# Patient Record
Sex: Female | Born: 1980 | Race: White | Hispanic: No | Smoking: Current every day smoker
Health system: Southern US, Community
[De-identification: ages and names within clinical notes are randomized; demographics above are authoritative.]

## PROBLEM LIST (undated history)

## (undated) ENCOUNTER — Emergency Department: Admission: EM | Payer: Medicaid Other | Source: Home / Self Care

## (undated) DIAGNOSIS — F32A Depression, unspecified: Secondary | ICD-10-CM

## (undated) DIAGNOSIS — N83209 Unspecified ovarian cyst, unspecified side: Secondary | ICD-10-CM

## (undated) DIAGNOSIS — B009 Herpesviral infection, unspecified: Secondary | ICD-10-CM

## (undated) DIAGNOSIS — F319 Bipolar disorder, unspecified: Secondary | ICD-10-CM

## (undated) DIAGNOSIS — K219 Gastro-esophageal reflux disease without esophagitis: Secondary | ICD-10-CM

## (undated) HISTORY — PX: APPENDECTOMY: SHX54

## (undated) HISTORY — PX: TUBAL LIGATION: SHX77

## (undated) HISTORY — PX: CHOLECYSTECTOMY: SHX55

---

## 2004-09-08 ENCOUNTER — Observation Stay: Payer: Self-pay

## 2004-09-22 ENCOUNTER — Inpatient Hospital Stay: Payer: Self-pay | Admitting: Obstetrics and Gynecology

## 2005-01-03 ENCOUNTER — Emergency Department: Payer: Self-pay | Admitting: Emergency Medicine

## 2005-06-15 ENCOUNTER — Emergency Department: Payer: Self-pay | Admitting: Emergency Medicine

## 2005-07-30 ENCOUNTER — Emergency Department: Payer: Self-pay | Admitting: Unknown Physician Specialty

## 2005-10-20 ENCOUNTER — Emergency Department: Payer: Self-pay | Admitting: Emergency Medicine

## 2005-11-07 ENCOUNTER — Emergency Department: Payer: Self-pay | Admitting: Internal Medicine

## 2006-08-31 ENCOUNTER — Emergency Department: Payer: Self-pay | Admitting: General Practice

## 2006-09-21 ENCOUNTER — Observation Stay: Payer: Self-pay

## 2006-10-18 ENCOUNTER — Inpatient Hospital Stay: Payer: Self-pay | Admitting: Obstetrics and Gynecology

## 2006-12-14 ENCOUNTER — Emergency Department: Payer: Self-pay | Admitting: Emergency Medicine

## 2007-01-02 ENCOUNTER — Emergency Department: Payer: Self-pay | Admitting: Emergency Medicine

## 2007-01-26 ENCOUNTER — Emergency Department: Payer: Self-pay | Admitting: Internal Medicine

## 2007-01-26 ENCOUNTER — Other Ambulatory Visit: Payer: Self-pay

## 2007-03-08 ENCOUNTER — Emergency Department: Payer: Self-pay | Admitting: Emergency Medicine

## 2007-03-10 ENCOUNTER — Emergency Department: Payer: Self-pay | Admitting: Emergency Medicine

## 2007-03-20 ENCOUNTER — Emergency Department: Payer: Self-pay

## 2007-04-17 ENCOUNTER — Emergency Department: Payer: Self-pay | Admitting: Unknown Physician Specialty

## 2007-05-16 ENCOUNTER — Emergency Department: Payer: Self-pay | Admitting: Emergency Medicine

## 2007-08-01 ENCOUNTER — Emergency Department: Payer: Self-pay | Admitting: Emergency Medicine

## 2008-03-02 ENCOUNTER — Emergency Department: Payer: Self-pay | Admitting: Unknown Physician Specialty

## 2008-04-04 ENCOUNTER — Emergency Department: Payer: Self-pay | Admitting: Emergency Medicine

## 2008-04-04 IMAGING — CR DG CHEST 2V
1 series · 2 of 2 positions shown · non-contrast
Comparison: none

REASON FOR EXAM: cough--pt in mc1
COMMENTS:

PROCEDURE:     DXR - DXR CHEST PA (OR AP) AND LATERAL  - [DATE] [DATE]
RESULT:     Comparison: [DATE].

[Series 1: view not recorded · 0.17mm/px · 2 of 2 slices shown]
[im 1/2]
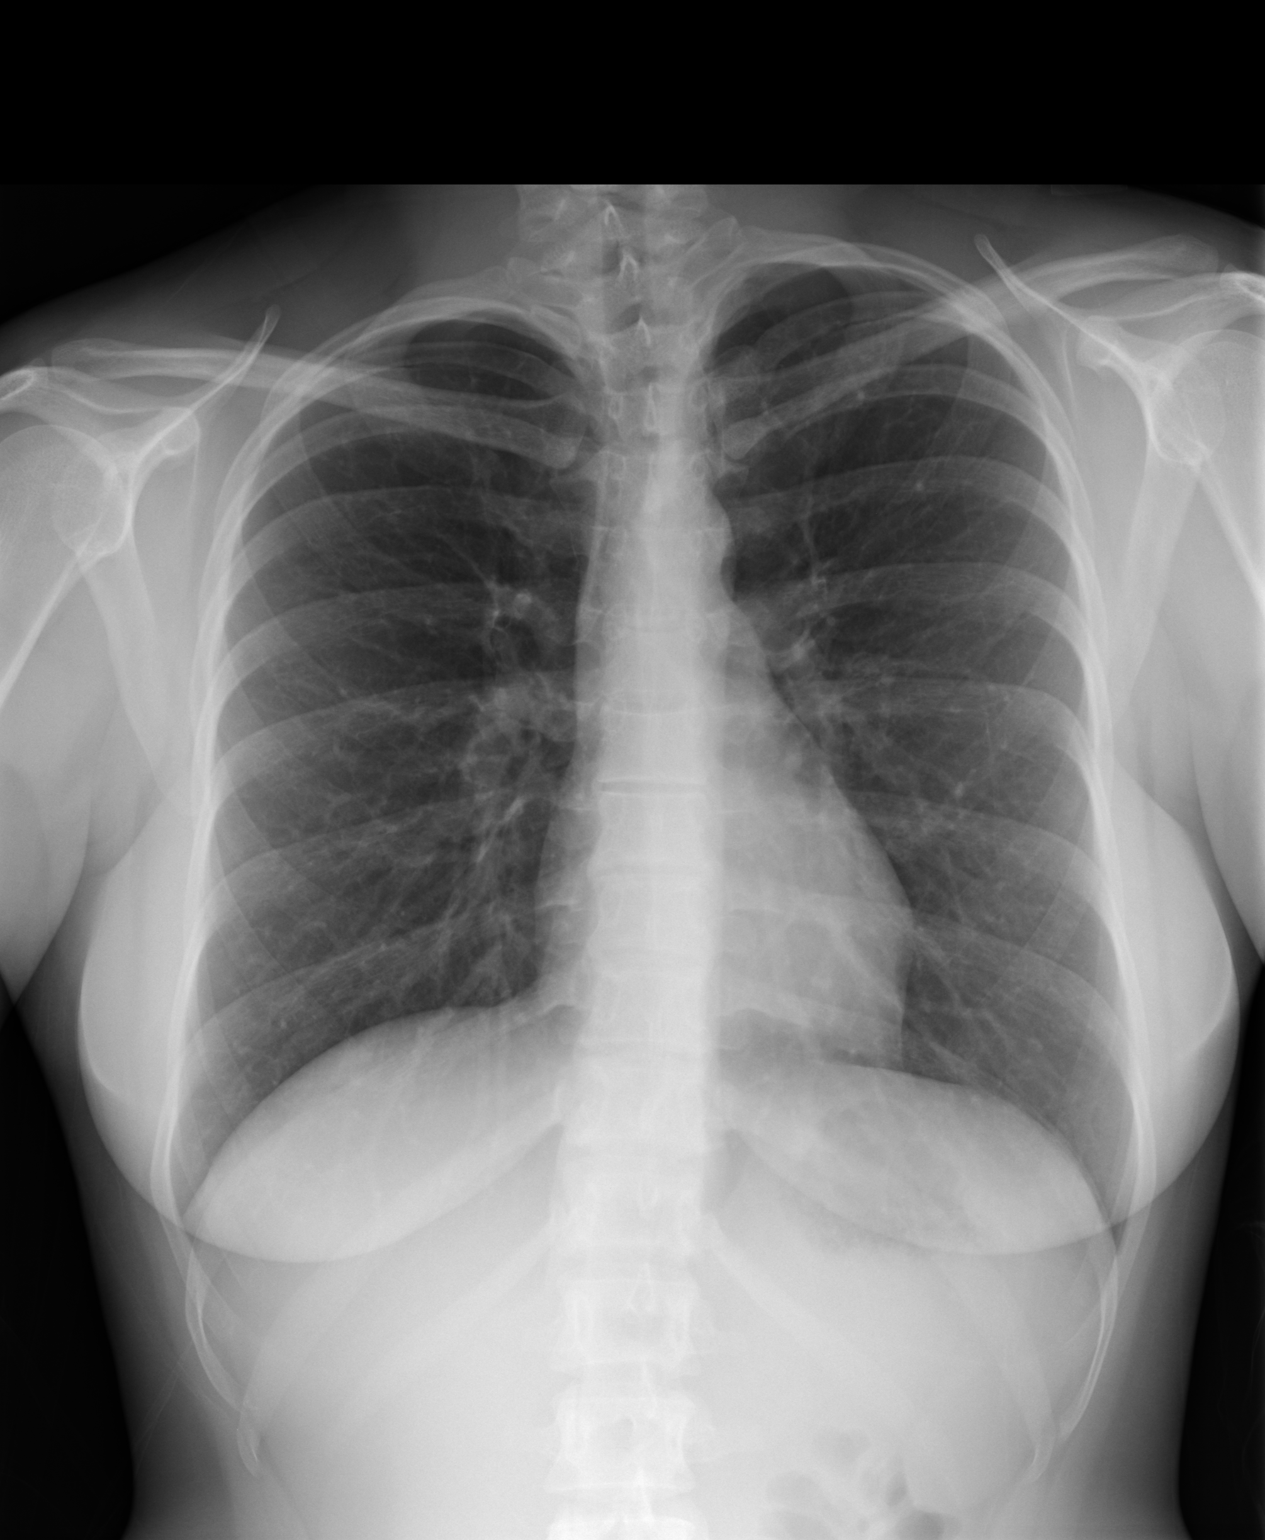
[im 2/2]
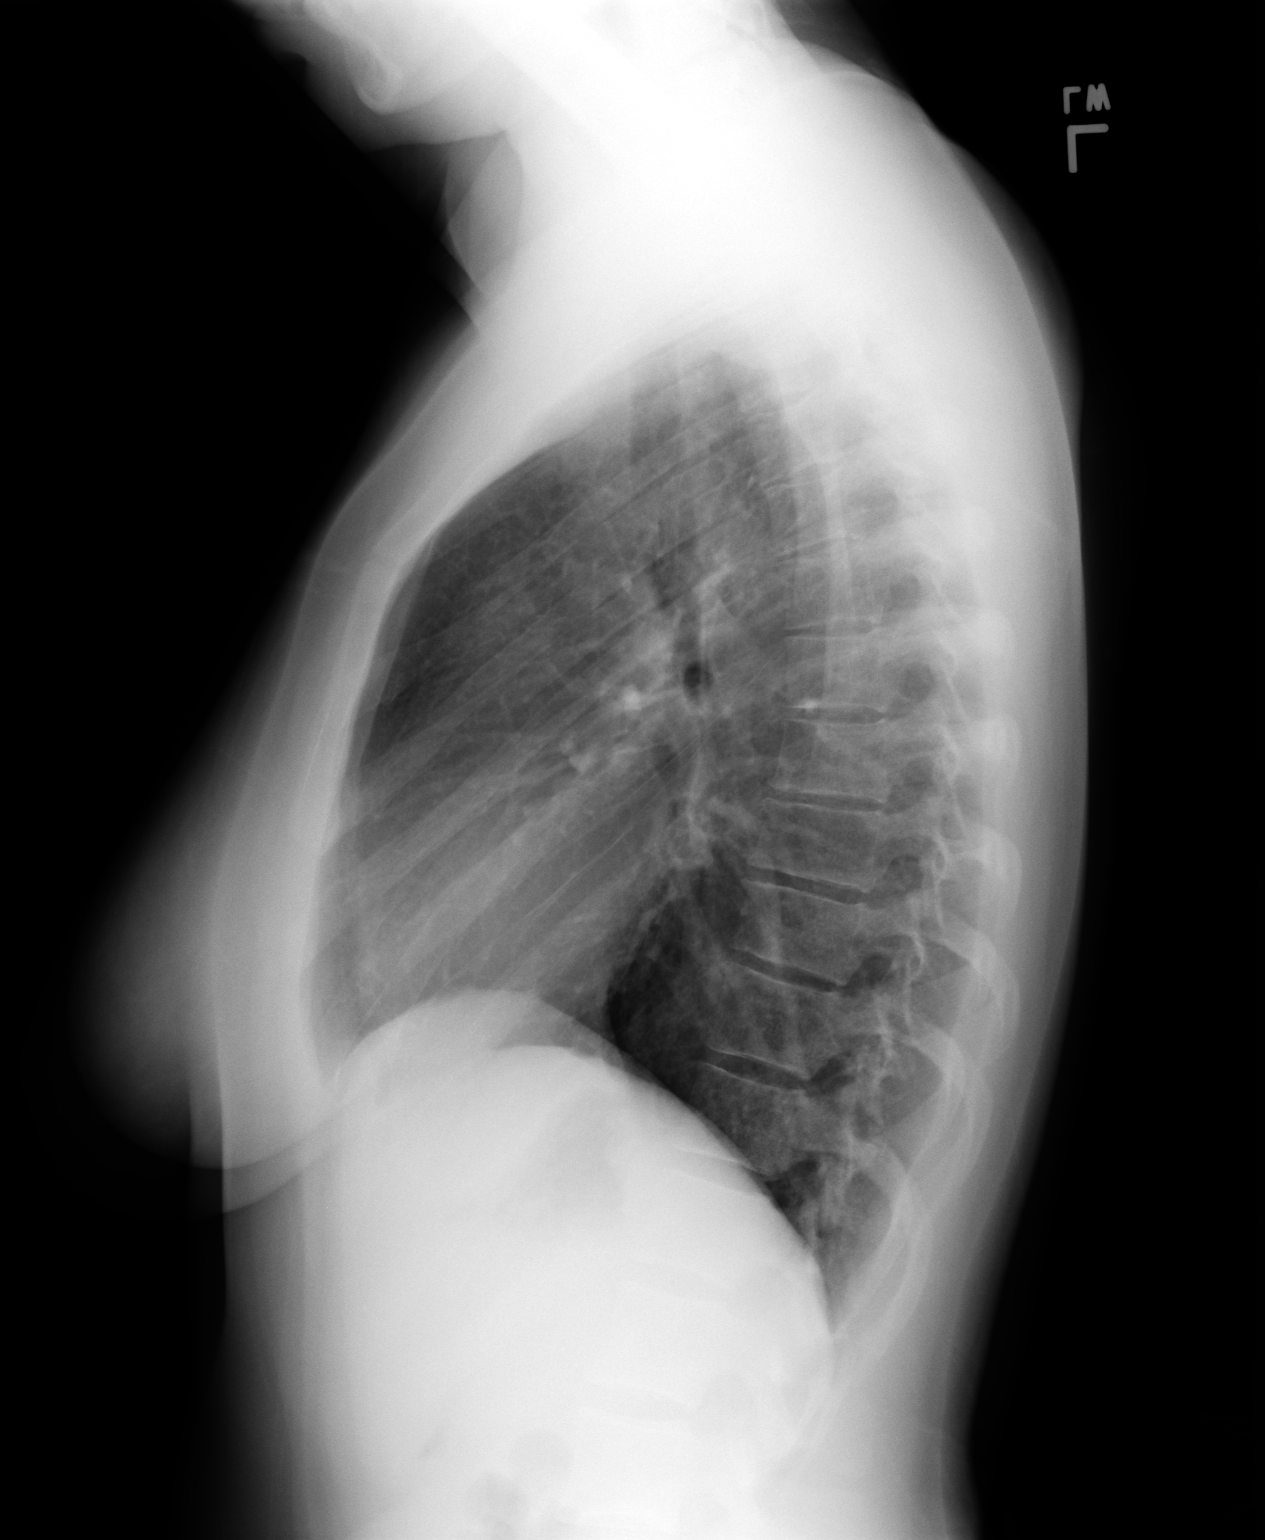

[2 of 2 positions shown; findings below may reference images not displayed]

FINDINGS: There is no significant pulmonary consolidation, pulmonary edema, pleural
effusion, nor pneumothorax. The cardiomediastinal silhouette is within
normal limits.

No grossly displaced rib fracture is noted.
IMPRESSION: 1. No acute cardiopulmonary abnormality is noted.

## 2008-05-06 ENCOUNTER — Emergency Department: Payer: Self-pay | Admitting: Emergency Medicine

## 2008-08-01 ENCOUNTER — Emergency Department: Payer: Self-pay | Admitting: Emergency Medicine

## 2008-09-02 ENCOUNTER — Emergency Department: Payer: Self-pay | Admitting: Emergency Medicine

## 2008-10-27 ENCOUNTER — Emergency Department: Payer: Self-pay | Admitting: Emergency Medicine

## 2008-10-28 ENCOUNTER — Emergency Department: Payer: Self-pay | Admitting: Emergency Medicine

## 2009-12-02 ENCOUNTER — Emergency Department: Payer: Self-pay | Admitting: Emergency Medicine

## 2009-12-03 ENCOUNTER — Inpatient Hospital Stay: Payer: Self-pay | Admitting: Internal Medicine

## 2010-04-09 ENCOUNTER — Emergency Department: Payer: Self-pay | Admitting: Unknown Physician Specialty

## 2010-05-19 ENCOUNTER — Emergency Department: Payer: Self-pay | Admitting: Emergency Medicine

## 2010-06-30 ENCOUNTER — Emergency Department: Payer: Self-pay | Admitting: Emergency Medicine

## 2010-07-27 ENCOUNTER — Emergency Department: Payer: Self-pay | Admitting: Internal Medicine

## 2010-09-04 ENCOUNTER — Inpatient Hospital Stay: Payer: Self-pay | Admitting: Psychiatry

## 2010-09-12 ENCOUNTER — Emergency Department: Payer: Self-pay | Admitting: Emergency Medicine

## 2010-09-19 ENCOUNTER — Emergency Department: Payer: Self-pay | Admitting: Emergency Medicine

## 2010-09-21 ENCOUNTER — Emergency Department: Payer: Self-pay | Admitting: Emergency Medicine

## 2010-11-07 ENCOUNTER — Emergency Department: Payer: Self-pay | Admitting: Internal Medicine

## 2010-11-22 ENCOUNTER — Emergency Department: Payer: Self-pay | Admitting: Emergency Medicine

## 2011-01-12 ENCOUNTER — Emergency Department: Payer: Self-pay | Admitting: Emergency Medicine

## 2011-02-23 ENCOUNTER — Emergency Department: Payer: Self-pay | Admitting: Internal Medicine

## 2011-04-04 ENCOUNTER — Emergency Department: Payer: Self-pay | Admitting: Emergency Medicine

## 2011-04-05 ENCOUNTER — Emergency Department: Payer: Self-pay | Admitting: Emergency Medicine

## 2012-02-26 ENCOUNTER — Emergency Department: Payer: Self-pay | Admitting: *Deleted

## 2012-02-26 LAB — URINALYSIS, COMPLETE
Bacteria: NEGATIVE
Glucose,UR: NEGATIVE mg/dL (ref 0–75)
Leukocyte Esterase: NEGATIVE
Nitrite: NEGATIVE
Protein: NEGATIVE
Specific Gravity: 1.021 (ref 1.003–1.030)
WBC UR: NONE SEEN /HPF (ref 0–5)

## 2012-10-02 ENCOUNTER — Emergency Department: Payer: Self-pay | Admitting: Emergency Medicine

## 2012-10-02 LAB — CK TOTAL AND CKMB (NOT AT ARMC)
CK, Total: 98 U/L (ref 21–215)
CK-MB: 1 ng/mL (ref 0.5–3.6)

## 2012-10-02 LAB — BASIC METABOLIC PANEL
Anion Gap: 8 (ref 7–16)
BUN: 9 mg/dL (ref 7–18)
Calcium, Total: 9.2 mg/dL (ref 8.5–10.1)
Chloride: 104 mmol/L (ref 98–107)
Creatinine: 0.79 mg/dL (ref 0.60–1.30)
EGFR (Non-African Amer.): >60
Osmolality: 275 (ref 275–301)
Potassium: 3.6 mmol/L (ref 3.5–5.1)

## 2012-10-02 LAB — CBC
MCHC: 33.5 g/dL (ref 32.0–36.0)
MCV: 84 fL (ref 80–100)
Platelet: 378 10*3/uL (ref 150–440)
RBC: 4.22 10*6/uL (ref 3.80–5.20)
RDW: 15.3 % — ABNORMAL HIGH (ref 11.5–14.5)
WBC: 12.5 10*3/uL — ABNORMAL HIGH (ref 3.6–11.0)

## 2012-10-03 LAB — RAPID INFLUENZA A&B ANTIGENS

## 2012-12-26 ENCOUNTER — Emergency Department: Payer: Self-pay | Admitting: Emergency Medicine

## 2012-12-26 LAB — URINALYSIS, COMPLETE
Bilirubin,UR: NEGATIVE
Ketone: NEGATIVE
Ph: 5 (ref 4.5–8.0)
Protein: NEGATIVE
Squamous Epithelial: 31
WBC UR: 6 /HPF (ref 0–5)

## 2013-01-25 LAB — HM PAP SMEAR: HM Pap smear: POSITIVE

## 2013-03-07 ENCOUNTER — Emergency Department: Payer: Self-pay | Admitting: Emergency Medicine

## 2013-10-08 ENCOUNTER — Emergency Department: Payer: Self-pay | Admitting: Emergency Medicine

## 2013-10-08 LAB — COMPREHENSIVE METABOLIC PANEL
Alkaline Phosphatase: 86 U/L
BUN: 10 mg/dL (ref 7–18)
Bilirubin,Total: 0.3 mg/dL (ref 0.2–1.0)
Calcium, Total: 9.3 mg/dL (ref 8.5–10.1)
Creatinine: 0.84 mg/dL (ref 0.60–1.30)
EGFR (Non-African Amer.): >60
Glucose: 115 mg/dL — ABNORMAL HIGH (ref 65–99)
Osmolality: 274 (ref 275–301)
Potassium: 3.4 mmol/L — ABNORMAL LOW (ref 3.5–5.1)
SGOT(AST): 22 U/L (ref 15–37)
Total Protein: 7.8 g/dL (ref 6.4–8.2)

## 2013-10-08 LAB — CBC WITH DIFFERENTIAL/PLATELET
Basophil #: 0.1 10*3/uL (ref 0.0–0.1)
Basophil %: 1 %
Eosinophil %: 0.7 %
Lymphocyte #: 3.1 10*3/uL (ref 1.0–3.6)
MCH: 27.5 pg (ref 26.0–34.0)
MCHC: 32.8 g/dL (ref 32.0–36.0)
MCV: 84 fL (ref 80–100)
Monocyte %: 7.3 %
Neutrophil #: 9.1 10*3/uL — ABNORMAL HIGH (ref 1.4–6.5)
Neutrophil %: 67.9 %
RBC: 4.26 10*6/uL (ref 3.80–5.20)
RDW: 14.3 % (ref 11.5–14.5)

## 2013-10-08 LAB — URINALYSIS, COMPLETE
Bacteria: NONE SEEN
Glucose,UR: NEGATIVE mg/dL (ref 0–75)
Ketone: NEGATIVE
Leukocyte Esterase: NEGATIVE
Nitrite: NEGATIVE
Ph: 6 (ref 4.5–8.0)
RBC,UR: 2 /HPF (ref 0–5)
Squamous Epithelial: <1

## 2013-10-08 LAB — LIPASE, BLOOD: Lipase: 108 U/L (ref 73–393)

## 2014-01-19 ENCOUNTER — Emergency Department: Payer: Self-pay | Admitting: Emergency Medicine

## 2014-01-24 ENCOUNTER — Emergency Department: Payer: Self-pay | Admitting: Emergency Medicine

## 2014-01-25 LAB — COMPREHENSIVE METABOLIC PANEL
ALBUMIN: 3.8 g/dL (ref 3.4–5.0)
AST: 24 U/L (ref 15–37)
Alkaline Phosphatase: 83 U/L
Anion Gap: 4 — ABNORMAL LOW (ref 7–16)
BILIRUBIN TOTAL: 0.2 mg/dL (ref 0.2–1.0)
BUN: 8 mg/dL (ref 7–18)
CALCIUM: 8.6 mg/dL (ref 8.5–10.1)
Chloride: 109 mmol/L — ABNORMAL HIGH (ref 98–107)
Co2: 28 mmol/L (ref 21–32)
Creatinine: 0.8 mg/dL (ref 0.60–1.30)
EGFR (African American): >60
EGFR (Non-African Amer.): >60
Glucose: 96 mg/dL (ref 65–99)
Osmolality: 279 (ref 275–301)
POTASSIUM: 3.5 mmol/L (ref 3.5–5.1)
SGPT (ALT): 25 U/L (ref 12–78)
Sodium: 141 mmol/L (ref 136–145)
Total Protein: 7 g/dL (ref 6.4–8.2)

## 2014-01-25 LAB — URINALYSIS, COMPLETE
Bacteria: NONE SEEN
Bilirubin,UR: NEGATIVE
Glucose,UR: NEGATIVE mg/dL (ref 0–75)
KETONE: NEGATIVE
Leukocyte Esterase: NEGATIVE
Nitrite: NEGATIVE
Ph: 6 (ref 4.5–8.0)
Protein: NEGATIVE
RBC,UR: <1 /HPF (ref 0–5)
Specific Gravity: 1.004 (ref 1.003–1.030)
Squamous Epithelial: 1
WBC UR: <1 /HPF (ref 0–5)

## 2014-01-25 LAB — CBC
HCT: 33.8 % — ABNORMAL LOW (ref 35.0–47.0)
HGB: 11.2 g/dL — AB (ref 12.0–16.0)
MCH: 28.5 pg (ref 26.0–34.0)
MCHC: 33.1 g/dL (ref 32.0–36.0)
MCV: 86 fL (ref 80–100)
PLATELETS: 320 10*3/uL (ref 150–440)
RBC: 3.93 10*6/uL (ref 3.80–5.20)
RDW: 16 % — ABNORMAL HIGH (ref 11.5–14.5)
WBC: 9.4 10*3/uL (ref 3.6–11.0)

## 2014-01-25 LAB — WET PREP, GENITAL

## 2014-01-25 LAB — GC/CHLAMYDIA PROBE AMP

## 2014-01-25 LAB — TROPONIN I

## 2014-02-13 ENCOUNTER — Inpatient Hospital Stay: Payer: Self-pay | Admitting: Internal Medicine

## 2014-02-13 LAB — CBC
HCT: 39.2 % (ref 35.0–47.0)
HGB: 12.4 g/dL (ref 12.0–16.0)
MCH: 27.4 pg (ref 26.0–34.0)
MCHC: 31.7 g/dL — ABNORMAL LOW (ref 32.0–36.0)
MCV: 87 fL (ref 80–100)
PLATELETS: 353 10*3/uL (ref 150–440)
RBC: 4.53 10*6/uL (ref 3.80–5.20)
RDW: 15.7 % — ABNORMAL HIGH (ref 11.5–14.5)
WBC: 15.4 10*3/uL — ABNORMAL HIGH (ref 3.6–11.0)

## 2014-02-13 LAB — COMPREHENSIVE METABOLIC PANEL
ALBUMIN: 4.1 g/dL (ref 3.4–5.0)
ALT: 28 U/L (ref 12–78)
ANION GAP: 4 — AB (ref 7–16)
Alkaline Phosphatase: 99 U/L
BUN: 8 mg/dL (ref 7–18)
Bilirubin,Total: 0.3 mg/dL (ref 0.2–1.0)
CALCIUM: 9.3 mg/dL (ref 8.5–10.1)
CHLORIDE: 106 mmol/L (ref 98–107)
CO2: 29 mmol/L (ref 21–32)
CREATININE: 0.95 mg/dL (ref 0.60–1.30)
EGFR (Non-African Amer.): >60
GLUCOSE: 115 mg/dL — AB (ref 65–99)
OSMOLALITY: 277 (ref 275–301)
Potassium: 3.6 mmol/L (ref 3.5–5.1)
SGOT(AST): 12 U/L — ABNORMAL LOW (ref 15–37)
Sodium: 139 mmol/L (ref 136–145)
Total Protein: 7.7 g/dL (ref 6.4–8.2)

## 2014-02-13 LAB — URINALYSIS, COMPLETE
Bilirubin,UR: NEGATIVE
Glucose,UR: NEGATIVE mg/dL (ref 0–75)
KETONE: NEGATIVE
Nitrite: POSITIVE
Ph: 6 (ref 4.5–8.0)
SPECIFIC GRAVITY: 1.009 (ref 1.003–1.030)
Squamous Epithelial: 2
WBC UR: 656 /HPF (ref 0–5)

## 2014-02-13 LAB — WET PREP, GENITAL

## 2014-02-14 LAB — CBC WITH DIFFERENTIAL/PLATELET
Basophil #: 0 10*3/uL (ref 0.0–0.1)
Basophil %: 0.2 %
EOS PCT: 1.5 %
Eosinophil #: 0.2 10*3/uL (ref 0.0–0.7)
HCT: 35.2 % (ref 35.0–47.0)
HGB: 11.3 g/dL — ABNORMAL LOW (ref 12.0–16.0)
Lymphocyte #: 2.6 10*3/uL (ref 1.0–3.6)
Lymphocyte %: 22.4 %
MCH: 28 pg (ref 26.0–34.0)
MCHC: 32.1 g/dL (ref 32.0–36.0)
MCV: 87 fL (ref 80–100)
Monocyte #: 1.1 x10 3/mm — ABNORMAL HIGH (ref 0.2–0.9)
Monocyte %: 9.1 %
NEUTROS ABS: 7.9 10*3/uL — AB (ref 1.4–6.5)
Neutrophil %: 66.8 %
PLATELETS: 313 10*3/uL (ref 150–440)
RBC: 4.04 10*6/uL (ref 3.80–5.20)
RDW: 15.8 % — AB (ref 11.5–14.5)
WBC: 11.8 10*3/uL — ABNORMAL HIGH (ref 3.6–11.0)

## 2014-02-14 LAB — BASIC METABOLIC PANEL
Anion Gap: 7 (ref 7–16)
BUN: 8 mg/dL (ref 7–18)
CREATININE: 0.91 mg/dL (ref 0.60–1.30)
Calcium, Total: 8.7 mg/dL (ref 8.5–10.1)
Chloride: 108 mmol/L — ABNORMAL HIGH (ref 98–107)
Co2: 28 mmol/L (ref 21–32)
EGFR (Non-African Amer.): >60
GLUCOSE: 94 mg/dL (ref 65–99)
Osmolality: 283 (ref 275–301)
POTASSIUM: 3.4 mmol/L — AB (ref 3.5–5.1)
SODIUM: 143 mmol/L (ref 136–145)

## 2014-02-14 LAB — GC/CHLAMYDIA PROBE AMP

## 2014-02-16 LAB — URINE CULTURE

## 2014-02-18 LAB — CULTURE, BLOOD (SINGLE)

## 2014-05-11 ENCOUNTER — Emergency Department: Payer: Self-pay | Admitting: Internal Medicine

## 2014-05-22 ENCOUNTER — Emergency Department: Payer: Self-pay

## 2014-05-22 LAB — URINALYSIS, COMPLETE
Bacteria: NONE SEEN
Bilirubin,UR: NEGATIVE
Glucose,UR: NEGATIVE mg/dL (ref 0–75)
KETONE: NEGATIVE
LEUKOCYTE ESTERASE: NEGATIVE
Nitrite: NEGATIVE
Ph: 6 (ref 4.5–8.0)
Protein: NEGATIVE
Specific Gravity: 1.014 (ref 1.003–1.030)
Squamous Epithelial: 11
WBC UR: <1 /HPF (ref 0–5)

## 2014-07-03 ENCOUNTER — Emergency Department: Payer: Self-pay | Admitting: Emergency Medicine

## 2014-07-03 LAB — COMPREHENSIVE METABOLIC PANEL
ALT: 19 U/L
Albumin: 4.2 g/dL (ref 3.4–5.0)
Alkaline Phosphatase: 83 U/L
Anion Gap: 7 (ref 7–16)
BUN: 10 mg/dL (ref 7–18)
Bilirubin,Total: 0.2 mg/dL (ref 0.2–1.0)
Calcium, Total: 9.2 mg/dL (ref 8.5–10.1)
Chloride: 106 mmol/L (ref 98–107)
Co2: 27 mmol/L (ref 21–32)
Creatinine: 1.02 mg/dL (ref 0.60–1.30)
EGFR (African American): >60
EGFR (Non-African Amer.): >60
Glucose: 101 mg/dL — ABNORMAL HIGH (ref 65–99)
Osmolality: 279 (ref 275–301)
POTASSIUM: 3.5 mmol/L (ref 3.5–5.1)
SGOT(AST): 17 U/L (ref 15–37)
Sodium: 140 mmol/L (ref 136–145)
TOTAL PROTEIN: 8 g/dL (ref 6.4–8.2)

## 2014-07-03 LAB — URINALYSIS, COMPLETE
BLOOD: NEGATIVE
Bilirubin,UR: NEGATIVE
GLUCOSE, UR: NEGATIVE mg/dL (ref 0–75)
KETONE: NEGATIVE
Leukocyte Esterase: NEGATIVE
Nitrite: NEGATIVE
PROTEIN: NEGATIVE
Ph: 5 (ref 4.5–8.0)
RBC,UR: 1 /HPF (ref 0–5)
Specific Gravity: 1.025 (ref 1.003–1.030)
Squamous Epithelial: 17
WBC UR: 4 /HPF (ref 0–5)

## 2014-07-03 LAB — CBC WITH DIFFERENTIAL/PLATELET
Basophil #: 0.1 10*3/uL (ref 0.0–0.1)
Basophil %: 0.8 %
EOS ABS: 0.2 10*3/uL (ref 0.0–0.7)
EOS PCT: 1.2 %
HCT: 38.1 % (ref 35.0–47.0)
HGB: 12.6 g/dL (ref 12.0–16.0)
Lymphocyte #: 3.7 10*3/uL — ABNORMAL HIGH (ref 1.0–3.6)
Lymphocyte %: 26.6 %
MCH: 29.3 pg (ref 26.0–34.0)
MCHC: 33 g/dL (ref 32.0–36.0)
MCV: 89 fL (ref 80–100)
MONO ABS: 0.8 x10 3/mm (ref 0.2–0.9)
MONOS PCT: 5.7 %
Neutrophil #: 9.1 10*3/uL — ABNORMAL HIGH (ref 1.4–6.5)
Neutrophil %: 65.7 %
Platelet: 374 10*3/uL (ref 150–440)
RBC: 4.29 10*6/uL (ref 3.80–5.20)
RDW: 14.5 % (ref 11.5–14.5)
WBC: 13.8 10*3/uL — ABNORMAL HIGH (ref 3.6–11.0)

## 2014-07-03 LAB — WET PREP, GENITAL

## 2014-07-04 LAB — GC/CHLAMYDIA PROBE AMP

## 2014-08-29 ENCOUNTER — Emergency Department: Payer: Self-pay | Admitting: Emergency Medicine

## 2014-12-12 ENCOUNTER — Emergency Department: Payer: Self-pay | Admitting: Emergency Medicine

## 2015-02-14 NOTE — Discharge Summary (Signed)
Dates of Admission and Diagnosis:  Date of Admission 13-Feb-2014   Date of Discharge 14-Feb-2014   Admitting Diagnosis PID   Final Diagnosis PID Smoker Anxiety Ovarian cyst    Chief Complaint/History of Present Illness a 34 year old female with past medical history of ovarian cyst, anxiety and nicotine dependence is presenting to the ER with a chief complaint of left lower quadrant abdominal pain associated with dizziness and nausea.  The patient reports that she was diagnosed with ovarian cyst approximately two weeks ago and reports it is hurting where the cyst is.  She was also complaining of back pain.  Her urine is positive for UTI and she is diagnosed with acute pyelonephritis.  Pelvic ultrasound reveals normal examination.  As the patient was complaining of vaginal discharge ER physician did a pelvic exam which has revealed malodor vaginal discharge associated with left adnexal tenderness and cervical motion tenderness.  The patient was started on IV Rocephin, clindamycin and Flagyl and hospitalist team is called to admit the patient.  During my examination, the patient is still complaining of bilateral flank pain associated with left lower quadrant abdominal pain.  The patient is reporting that her left lower quadrant hurts where exactly her ovarian cyst is.  She reports that she has only one partner, but also stating that one of her boyfriend???s will come and stay with her tonight as she cannot stay lonely and also reports that she is not sexually related to him.   Allergies:  Codeine: GI Distress  Hepatic:  23-Apr-15 19:39   Bilirubin, Total 0.3  Alkaline Phosphatase 99 (45-117 NOTE: New Reference Range 09/13/13)  SGPT (ALT) 28  SGOT (AST)  12  Total Protein, Serum 7.7  Albumin, Serum 4.1  Routine Micro:  23-Apr-15 19:39   Micro Text Report URINE CULTURE   ORGANISM 1                >100,000 CFU/ML GRAM NEGATIVE ROD   COMMENT                   ID TO FOLLOW SENSITIVITIES TO  FOLLOW   ANTIBIOTIC                       Culture Comment ID TO FOLLOW SENSITIVITIES TO FOLLOW  Result(s) reported on 15 Feb 2014 at 09:47AM.  Organism Name GRAM NEGATIVE ROD  Organism Quantity >100,000 CFU/ML  Specimen Source CLEAN CATCH  Organism 1 >100,000 CFU/ML GRAM NEGATIVE ROD  Routine Chem:  23-Apr-15 19:39   Glucose, Serum  115  BUN 8  Creatinine (comp) 0.95  Sodium, Serum 139  Potassium, Serum 3.6  Chloride, Serum 106  CO2, Serum 29  Calcium (Total), Serum 9.3  Anion Gap  4  Osmolality (calc) 277  eGFR (African American) >60  eGFR (Non-African American) >60 (eGFR values <13m/min/1.73 m2 may be an indication of chronic kidney disease (CKD). Calculated eGFR is useful in patients with stable renal function. The eGFR calculation will not be reliable in acutely ill patients when serum creatinine is changing rapidly. It is not useful in  patients on dialysis. The eGFR calculation may not be applicable to patients at the low and high extremes of body sizes, pregnant women, and vegetarians.)  Routine UA:  23-Apr-15 19:39   Color (UA) Yellow  Clarity (UA) Cloudy  Glucose (UA) Negative  Bilirubin (UA) Negative  Ketones (UA) Negative  Specific Gravity (UA) 1.009  Blood (UA) 3+  pH (UA) 6.0  Protein (UA)  100 mg/dL  Nitrite (UA) Positive  Leukocyte Esterase (UA) 3+ (Result(s) reported on 13 Feb 2014 at 08:09PM.)  RBC (UA) 224 /HPF  WBC (UA) 656 /HPF  Bacteria (UA) 3+  Epithelial Cells (UA) 2 /HPF  WBC Clump (UA) PRESENT  Budding Yeast (UA) PRESENT (Result(s) reported on 13 Feb 2014 at 08:09PM.)  Routine Hem:  23-Apr-15 19:39   WBC (CBC)  15.4  RBC (CBC) 4.53  Hemoglobin (CBC) 12.4  Hematocrit (CBC) 39.2  Platelet Count (CBC) 353 (Result(s) reported on 13 Feb 2014 at 08:10PM.)  MCV 87  MCH 27.4  MCHC  31.7  RDW  15.7   PERTINENT RADIOLOGY STUDIES: [place]:    24-Apr-15 00:17, US Pelvis Ultrasound Exam with Transvaginal - NON-OB  US Pelvis Ultrasound Exam with  Transvaginal - NON-OB   REASON FOR EXAM:    left adnexal pain  COMMENTS:       PROCEDURE: [place]  - US PELVIS EXAM W/TRANSVAGINAL  - Feb 14 2014 12:17AM     CLINICAL DATA:  Left adnexal pain    EXAM:  TRANSABDOMINAL AND TRANSVAGINAL ULTRASOUND OF PELVIS    TECHNIQUE:  Both transabdominal and transvaginal ultrasound examinations of the  pelvis were performed. Transabdominal technique was performed for  global imaging of the pelvis including uterus, ovaries, adnexal  regions, and pelvic cul-de-sac. It was necessary to proceedwith  endovaginal exam following the transabdominal exam to visualize the  adnexae.    COMPARISON:  [place] PELV - US TRANSVAGINAL dated 01/25/2014; CT ABD-PELV  W/ CM dated 02/23/2011    FINDINGS:  Uterus    Measurements: 7.1 x 4.7 x 5.0 cm. No fibroids or other mass  visualized.    Endometrium    Thickness: 7 mm in thickness anemia. No focal abnormality  visualized.    Right ovary    Measurements: 2.4 x 2.0 x 1.9 cm. Normal appearance/no adnexal mass.    Left ovary    Measurements: 3.4 x 2.4 x 2.8 cm. Normal appearance/no adnexal mass.    Other findings    No free fluid.     IMPRESSION:  Within normal limits.      Electronically Signed    By: Maryclare Bean M.D.    On: 02/14/2014 00:22         Verified By: Jamas Lav, M.D.,   Pertinent Past History:  Pertinent Past History Anxiety, history of ovarian cyst which was diagnosed two weeks ago.   Hospital Course:  Hospital Course * PID- seen by Gyn- suggest Doxy + Flagyl for 14 days- prescriptions given,.   Discharged home.  * Smoking- Councelled for 4 min to quit- does not need nicotin.  * anxiety- currently calm.  * Ovarian cyst- addressed by [place] in office.   Condition on Discharge Stable   Code Status:  Code Status Full Code   DISCHARGE INSTRUCTIONS HOME MEDS:  Medication Reconciliation: Patient's Home Medications at Discharge:     Medication Instructions  metronidazole  500 mg oral tablet  1 tab(s) orally every 12 hours x 14 days   doxycycline  100 milligram(s) orally every 12 hours x 14 days   ibuprofen 800 mg oral tablet  1 tab(s) orally 3 times a day x 10 days     Physician's Instructions:  Diet Regular   Activity Limitations As tolerated   Return to Work Not Applicable   Time frame for Follow Up Appointment 1-2 weeks  GYN clinic.   Electronic Signatures: Vaughan Basta (MD)  (Signed 25-Apr-15 19:40)  Authored: ADMISSION DATE AND DIAGNOSIS, CHIEF COMPLAINT/HPI, Allergies, PERTINENT LABS, PERTINENT RADIOLOGY STUDIES, PERTINENT PAST HISTORY, HOSPITAL COURSE, DISCHARGE INSTRUCTIONS HOME MEDS, PATIENT INSTRUCTIONS   Last Updated: 25-Apr-15 19:40 by Vaughan Basta (MD)

## 2015-02-14 NOTE — Consult Note (Signed)
Consulting Department: Medicine Consulting Physician: Ramonita LabGouru, Aruna MD Consulting Question: Vanita PandaEvalute for Pelvic Inflammatory Disease History of Present Illness: 34 year old [id number] presenting with 4 day history of left lower quadrant pain.  The pain is described as stabbing in quality.  There is no radiation to the back, groin, or legs.  The pain is rated a 7/10.  She reports constipation with last bowl movement 2-3 days ago.  Denies dysuria, vaginal discharge, abnormal uterine bleeding, fevers, chills, emesis.  There are no aggravating or alleviating factors reported by the patient.  She does feel better than since admission.  She had an ultrasound on 01/25/2014 for pelvic pain showing 3.3 x 1.8 x 3.1cm complex left adnexal mass. Review of Systems: 10 point review of systems negative unless otherwise noted in HPI Past Medical History: 1) Depression/Bipolar Past Surgical History 1) C-section x 2 2) BTL at time of last C-section 3) Appendectomy age 34 Obstetric History: [id number], TSVD x 2, primary low transverse C-section for non-reassuring fetal surveillance followed by elective repeat C-section with BTL.  She has a history of one elective abortion as well as one spontaneous abortion. Gynecologic History: Menarche age 34, regular monthly menses, heavy flow, 5-7 days in duration.  LMP 01/01/14.  She has had a pap within the last year at [facility], remote history of chlamydiaHistory: non-contributory Social History:  PPD smoker (9 pack year smoking history), no EtOH, denies illicit drugs use Allergies: Codeine Home Medications:  none Physical Exam: Tc 97.3 Tmax 98.1; BP 126/70 (102-126 / 67-74); HR 72 (72-79); RR 18; O2sat 99% RA (98-100% RA)NADnormocephalic, anictericCTABRRR, no adventitious heart sounds, peripheral pulses 2+NABS, soft, non-distended, mild left lower quadrant tenderness, no rebound, no guardingnormal external female genitalia, uterus anteverted, non-enlarged, +CMT, no adnexal massesno edema,  no erythema, no tendernessmood appropriate, affect fullgait normal, grossly no neurologic deficits Laboratory: 02/13/2014 19:39: Na 139, K 3.6, Cl 106, CO2 29, BUN 8, Cr 0.95, BG 115, ALT 28, AST 12, T. Protein 7.704/23/2015 19:39: WBC 15.4K, H&H 12.4 & 39.2, platelets 353Kpregnancy 02/13/2014 19:39: negativecultures 02/13/2014 22:56: negative to date& CT DNA probe 02/13/2014 22:58 negative & negativemount 02/13/2014 22:58: no trichomonas, yeast, or clue cells.  Rare white blood cells04/24/2015 04:37: WBC 11.8K, H&H 11.3 & 35.2; platelets 313K Imaging TV[facility] 02/14/2014 00:17: resolution of previously documented left ovarian cyst (likely hemorrhagic), normal uterus and ovaries Assessment: 34 year old [id number] with presumptive culture negative PID Plan: 1) PID ? actually several factors would argue against this patient having PID including negative gonorrhea and chlamydia, minimal WBC on wet mount, and patient status post prior BTL so no pathway for ascending infection into the pelvis.  She did have a mild elevation in WBC on admission.  However, given CMT agree with presumptive treatment for PID.  Given that she has no po intolerance feel this patient can be disposition to outpatient therapy - Doxycline 100mg  po bid and metronidazole 500mg  po bid x 14 days - Ibuprofen 800mg  po q8hrs prns pain - [facility]    Electronic Signatures: Lorrene ReidStaebler, Nnamdi Dacus M (MD)  (Signed on 24-Apr-15 14:30)  Authored  Last Updated: 24-Apr-15 14:30 by Lorrene ReidStaebler, Nariyah Osias M (MD)

## 2015-02-14 NOTE — H&P (Signed)
PATIENT NAME:  Briana Lyons, Briana Lyons MR#:  [id number] DATE OF BIRTH:  [birth year]  DATE OF ADMISSION:  02/13/2014  PRIMARY CARE PHYSICIAN:  Nonlocal.   REFERRING EMERGENCY ROOM PHYSICIAN:  Dr. Margarita GrizzleWoodruff.   CHIEF COMPLAINT:  Dizziness associated with nausea and left lower quadrant abdominal pain.   HISTORY OF PRESENT ILLNESS:  The patient is a 34 year old female with past medical history of ovarian cyst, anxiety and nicotine dependence is presenting to the ER with a chief complaint of left lower quadrant abdominal pain associated with dizziness and nausea.  The patient reports that she was diagnosed with ovarian cyst approximately two weeks ago and reports it is hurting where the cyst is.  She was also complaining of back pain.  Her urine is positive for UTI and she is diagnosed with acute pyelonephritis.  Pelvic ultrasound reveals normal examination.  As the patient was complaining of vaginal discharge ER physician did a pelvic exam which has revealed malodor vaginal discharge associated with left adnexal tenderness and cervical motion tenderness.  The patient was started on IV Rocephin, clindamycin and Flagyl and hospitalist team is called to admit the patient.  During my examination, the patient is still complaining of bilateral flank pain associated with left lower quadrant abdominal pain.  The patient is reporting that her left lower quadrant hurts where exactly her ovarian cyst is.  She reports that she has only one partner, but also stating that one of her boyfriend's will come and stay with her tonight as she cannot stay lonely and also reports that she is not sexually related to him.    PAST MEDICAL HISTORY:  Anxiety, history of ovarian cyst which was diagnosed two weeks ago.   PAST SURGICAL HISTORY:  C-section, appendectomy.   PSYCHOSOCIAL HISTORY:  Lives alone.  Reports only one sexual partner, but diagnosed with STDs in the past, smokes half pack a day.  Occasional intake of alcohol.  Denies any  illicit drug usage.   FAMILY HISTORY:  Mom had heart attack.  Dad has liver cirrhosis.  Grandma has diabetes mellitus.   HOME MEDICATIONS:  Tramadol 50 mg by mouth q. 6 hours as needed for pain, pseudoephedrine 60 mg 1 tablet by mouth q. 6 hours.   REVIEW OF SYSTEMS:  CONSTITUTIONAL:  Denies any fever, but complaining of fatigue and weakness.  EYES:  Denies blurry vision, double vision, eye pain, redness or inflammation.  EARS, NOSE, THROAT:  Denies epistaxis, discharge, postnasal drip.  RESPIRATION:  Denies cough, COPD, painful respiration.  CARDIOVASCULAR:  No chest pain, palpitations.  GASTROINTESTINAL:  Complaining of nausea.  Denies vomiting, diarrhea.  Complaining of left lower quadrant abdominal pain where her ovarian cyst is.  Denies any hematemesis or GERD.  GENITOURINARY:  No dysuria or hematuria, renal calculi. GYNECOLOGIC AND BREAST:  Denies breast mass, but complaining of foul-smelling vaginal discharge.  Past medical history of STDs.  ENDOCRINE:  Denies polyuria, nocturia, thyroid problems.  HEMATOLOGIC AND LYMPHATIC:  No anemia, easy bruising, bleeding.  INTEGUMENTARY:  No acne, rash, lesions.  MUSCULOSKELETAL:  No joint pain in the neck and shoulder.  Complaining of bilateral flank pain.  Denies gout.  NEUROLOGIC:  Denies vertigo, ataxia, weakness, dysarthria.  PSYCHIATRIC:  No ADD, OCD, insomnia.   PHYSICAL EXAMINATION: VITAL SIGNS:  Temperature 97.5, pulse 76, respirations 18, blood pressure 126/73, pulse ox 96%.  GENERAL APPEARANCE:  Not under acute distress.  Moderately built and nourished.  HEENT:  Normocephalic, atraumatic.  Pupils are equally reacting to light and accommodation.  No scleral icterus.  No conjunctival injection.  No sinus tenderness.  Moist mucous membranes.  No postnasal drip.  NECK:  Supple.  No JVD.  No thyromegaly.  Range of motion is intact.  LUNGS:  Clear to auscultation bilaterally.  No accessory muscle usage.  No anterior chest wall tenderness  on palpation.  CARDIAC:  S1, S2 normal.  Regular rate and rhythm.  No murmurs.  GASTROINTESTINAL:  Soft.  Bowel sounds are positive in all four quadrants, positive suprapubic tenderness and left lower quadrant tenderness, but no rebound tenderness.  No masses felt.  Positive CVA tenderness bilaterally.  NEUROLOGIC:  Awake, alert, oriented x 3.  Motor and sensory are grossly intact.  Reflexes are 2+.  EXTREMITIES:  No edema.  No cyanosis.  No clubbing.  SKIN:  Warm to touch.  Normal turgor.  No rashes.  No lesions.  MUSCULOSKELETAL:  No joint effusion, tenderness, erythema.  PSYCHIATRIC:  Flat mood and affect.   LABORATORIES AND IMAGING STUDIES:  A pelvic ultrasound within normal limits.  Urine pregnancy test is negative.  BMP is normal except glucose is at 115 and anion gap at 4.  LFTs are normal except AST which is low at 12.  CBC shows WBC 15.4, normal hemoglobin and hematocrit and platelets.  Wet preparation shows Chlamydia, trichomonas negative, gonorrhea negative.  Urinalysis yellow in color, cloudy in appearance, blood 3+, protein 100 mg/dL, nitrites positive, leukocyte esterase 3+, WBC clumps are present, budding yeast is present.   ASSESSMENT AND PLAN:  A 34 year old Caucasian female presenting to the ER with a chief complaint of nausea, dizziness, back pain and left lower quadrant abdominal pain will be admitted with the following assessment and plan.  1.  Dizziness with nausea, probably from acute pyelonephritis with yeast infection of the bladder.  Urine culture was obtained.  The patient was given intravenous Rocephin.  We will continue that and the patient will be on Diflucan as well.  We will provide intravenous fluids and pain management.  2.  Pelvic inflammatory disease with a past medical history of sexually transmitted diseases.  Pelvic exam was done and wet mount was done by the ER physician.  We will continue doxycycline along with metronidazole and Rocephin.  The patient will be on  lactobacillus twice a day.  We will consult gynecology.  3.  Chronic history of anxiety.  4.  History of ovarian cyst.  We will put in a gynecology consult and the patient is to follow up with gynecology as an outpatient regarding further evaluation of the ovarian cyst as needed.   5.  Nicotine dependence.  The patient was counseled to quit smoking for four minutes.  The patient will be on nicotine patch.   6.  We will provide her gastrointestinal prophylaxis.  She is ambulatory.   7.  CODE STATUS:  SHE IS FULL CODE.    Diagnosis and plan of care was discussed in detail with the patient.  She is aware of the plan.    Total time spent on the admission is 50 minutes.    ____________________________ Ramonita Lab, MD ag:ea D: 02/14/2014 01:08:45 ET T: 02/14/2014 04:39:59 ET JOB#: [id number]  cc: Ramonita Lab, MD, <Dictator> Ramonita Lab MD ELECTRONICALLY SIGNED 02/26/2014 4:14

## 2015-02-14 NOTE — Consult Note (Signed)
Brief Consult Note: Diagnosis: PID.   Patient was seen by consultant.   Consult note dictated.   Comments: 34 yo [id number] presenting with acute onset abdominal pain 1) PID - GC & CT negative, wet mount without significant WBC, patient also has had prior tubal ligation so there should be not ascending pathway for infection into the pelvis.  Previously demonstrated LOV cyst resolved on follow up ultrasound.  Does have CMT which is non-specific and will be positive with any periotneal irriation.  She is s/p prior appendectomy.  Would consider GI etiologies in differential.  As she is not having any emesis and is demonstrating po tolerance (ordered Pizza for dinner as I was in the room) my patient would be for outpatient treatment.     - Rx for doxycyline 100mg  po bid x 14 days     - Rx flagyl 500mg  po bid x 14 days     - Ibuprofen 800mg  po q8hrs prn pain     - All Rx have been placed in patient's chart     - [facility].  Electronic Signatures: Lorrene ReidStaebler, Emmamae Mcnamara M (MD)  (Signed 24-Apr-15 12:43)  Authored: Brief Consult Note   Last Updated: 24-Apr-15 12:43 by Lorrene ReidStaebler, Lavin Petteway M (MD)

## 2015-02-19 ENCOUNTER — Emergency Department
Admit: 2015-02-19 | Disposition: A | Discharge status: Home / Self Care | Payer: Self-pay | Admitting: Emergency Medicine

## 2015-02-19 LAB — URINALYSIS, COMPLETE
BILIRUBIN, UR: NEGATIVE
BLOOD: NEGATIVE
Glucose,UR: NEGATIVE mg/dL (ref 0–75)
Ketone: NEGATIVE
Leukocyte Esterase: NEGATIVE
Nitrite: NEGATIVE
Ph: 5 (ref 4.5–8.0)
Protein: NEGATIVE
Specific Gravity: 1.019 (ref 1.003–1.030)

## 2015-03-03 ENCOUNTER — Encounter: Payer: Self-pay | Admitting: Medical Oncology

## 2015-03-03 ENCOUNTER — Emergency Department
Admission: EM | Admit: 2015-03-03 | Discharge: 2015-03-03 | Disposition: A | Discharge status: Home / Self Care | Payer: Self-pay | Attending: Emergency Medicine | Admitting: Emergency Medicine

## 2015-03-03 DIAGNOSIS — Z720 Tobacco use: Secondary | ICD-10-CM | POA: Insufficient documentation

## 2015-03-03 DIAGNOSIS — R109 Unspecified abdominal pain: Secondary | ICD-10-CM

## 2015-03-03 DIAGNOSIS — R300 Dysuria: Secondary | ICD-10-CM | POA: Insufficient documentation

## 2015-03-03 DIAGNOSIS — Z9089 Acquired absence of other organs: Secondary | ICD-10-CM | POA: Insufficient documentation

## 2015-03-03 DIAGNOSIS — Z3202 Encounter for pregnancy test, result negative: Secondary | ICD-10-CM | POA: Insufficient documentation

## 2015-03-03 LAB — URINALYSIS COMPLETE WITH MICROSCOPIC (ARMC ONLY)
Bacteria, UA: NONE SEEN
Bilirubin Urine: NEGATIVE
Glucose, UA: NEGATIVE mg/dL
HGB URINE DIPSTICK: NEGATIVE
Ketones, ur: NEGATIVE mg/dL
LEUKOCYTES UA: NEGATIVE
NITRITE: NEGATIVE
Protein, ur: NEGATIVE mg/dL
SPECIFIC GRAVITY, URINE: 1.026 (ref 1.005–1.030)
pH: 5 (ref 5.0–8.0)

## 2015-03-03 LAB — WET PREP, GENITAL
CLUE CELLS WET PREP: NONE SEEN
TRICH WET PREP: NONE SEEN
YEAST WET PREP: NONE SEEN

## 2015-03-03 LAB — CBC WITH DIFFERENTIAL/PLATELET
BASOS ABS: 0 10*3/uL (ref 0–0.1)
Basophils Relative: 0 %
EOS PCT: 1 %
Eosinophils Absolute: 0.2 10*3/uL (ref 0–0.7)
HEMATOCRIT: 36.7 % (ref 35.0–47.0)
Hemoglobin: 12.5 g/dL (ref 12.0–16.0)
LYMPHS PCT: 21 %
Lymphs Abs: 2.5 10*3/uL (ref 1.0–3.6)
MCH: 29.4 pg (ref 26.0–34.0)
MCHC: 34 g/dL (ref 32.0–36.0)
MCV: 86.6 fL (ref 80.0–100.0)
Monocytes Absolute: 0.7 10*3/uL (ref 0.2–0.9)
Monocytes Relative: 6 %
NEUTROS ABS: 8.3 10*3/uL — AB (ref 1.4–6.5)
Neutrophils Relative %: 72 %
Platelets: 325 10*3/uL (ref 150–440)
RBC: 4.25 MIL/uL (ref 3.80–5.20)
RDW: 14.3 % (ref 11.5–14.5)
WBC: 11.8 10*3/uL — AB (ref 3.6–11.0)

## 2015-03-03 LAB — COMPREHENSIVE METABOLIC PANEL
ALBUMIN: 4.5 g/dL (ref 3.5–5.0)
ALK PHOS: 65 U/L (ref 38–126)
ALT: 14 U/L (ref 14–54)
AST: 16 U/L (ref 15–41)
Anion gap: 9 (ref 5–15)
BUN: 13 mg/dL (ref 6–20)
CHLORIDE: 107 mmol/L (ref 101–111)
CO2: 25 mmol/L (ref 22–32)
Calcium: 9.4 mg/dL (ref 8.9–10.3)
Creatinine, Ser: 0.85 mg/dL (ref 0.44–1.00)
GFR calc non Af Amer: >60 mL/min (ref >60)
GLUCOSE: 115 mg/dL — AB (ref 65–99)
POTASSIUM: 3.7 mmol/L (ref 3.5–5.1)
SODIUM: 141 mmol/L (ref 135–145)
Total Bilirubin: 0.1 mg/dL — ABNORMAL LOW (ref 0.3–1.2)
Total Protein: 7.7 g/dL (ref 6.5–8.1)

## 2015-03-03 LAB — CHLAMYDIA/NGC RT PCR (ARMC ONLY)
Chlamydia Tr: NOT DETECTED
N gonorrhoeae: NOT DETECTED

## 2015-03-03 LAB — POCT PREGNANCY, URINE: PREG TEST UR: NEGATIVE

## 2015-03-03 MED ORDER — NITROFURANTOIN MONOHYD MACRO 100 MG PO CAPS: 100.0000 mg | ORAL_CAPSULE | Freq: Two times a day (BID) | ORAL | Status: AC

## 2015-03-03 MED ORDER — ACETAMINOPHEN 325 MG PO TABS
650.0000 mg | ORAL_TABLET | Freq: Once | ORAL | Status: AC
  Administered 2015-03-03: 650 mg via ORAL

## 2015-03-03 MED ORDER — ACETAMINOPHEN 325 MG PO TABS
ORAL_TABLET | ORAL | Status: AC
  Administered 2015-03-03: 650 mg via ORAL
  Filled 2015-03-03: qty 2

## 2015-03-03 NOTE — ED Notes (Signed)
Pt ambulatory with reports that she began having lower abd pain and dysuria x 1 week.

## 2015-03-03 NOTE — ED Provider Notes (Signed)
[facility] Emergency Department Provider Note   ____________________________________________  Time seen: [id number]  I have reviewed the triage vital signs and the nursing notes.   HISTORY  Chief Complaint Abdominal Pain   History limited by: Not Limited   HPI Briana Lyons is a 34 y.o. female who presents to the emergency Department with complaints of lower abdominal pain and burning with urination for the past one week. The symptoms have been constant and gradually getting worse. Patient states she has had some nausea associated with this. Additionally the patient states she has had some vaginal discharge. She describes this as yellow in color. The patient believes she has a bladder infection. She denies any true fevers however states she is alternated from feeling hot and cold. She states she has had similar symptoms in the past.     History reviewed. No pertinent past medical history.  There are no active problems to display for this patient.   Past Surgical History  Procedure Laterality Date  . Tubal ligation    . Appendectomy      No current outpatient prescriptions on file.  Allergies Codeine  No family history on file.  Social History History  Substance Use Topics  . Smoking status: Current Every Day Smoker -- 1.00 packs/day  . Smokeless tobacco: Not on file  . Alcohol Use: No    Review of Systems  Constitutional: Negative for fever. Cardiovascular: Negative for chest pain. Respiratory: Negative for shortness of breath. Gastrointestinal: Lower abdominal pain Genitourinary: Positive for dysuria. Musculoskeletal: Negative for back pain. Skin: Negative for rash. Neurological: Negative for headaches, focal weakness or numbness.   10-point ROS otherwise negative.  ____________________________________________   PHYSICAL EXAM:  VITAL SIGNS: ED Triage Vitals  Enc Vitals Group     BP 03/03/15 1635 120/76 mmHg     Pulse  Rate 03/03/15 1635 97     Resp 03/03/15 1635 20     Temp 03/03/15 1635 98.6 F (37 C)     Temp Source 03/03/15 1635 Oral     SpO2 03/03/15 1635 99 %     Weight 03/03/15 1635 180 lb (81.647 kg)     Height 03/03/15 1635 5\' 2"  (1.575 m)     Head Cir --      Peak Flow --      Pain Score 03/03/15 1653 9   Constitutional: Alert and oriented. Well appearing and in no distress. Eyes: Conjunctivae are normal. PERRL. Normal extraocular movements. ENT   Head: Normocephalic and atraumatic.   Nose: No congestion/rhinnorhea.   Mouth/Throat: Mucous membranes are moist.   Neck: No stridor. Hematological/Lymphatic/Immunilogical: No cervical lymphadenopathy. Cardiovascular: Normal rate, regular rhythm.  No murmurs, rubs, or gallops. Respiratory: Normal respiratory effort without tachypnea nor retractions. Breath sounds are clear and equal bilaterally. No wheezes/rales/rhonchi. Gastrointestinal: Soft and minimally tender to palpation in the suprapubic and left lower quadrant. No rebound. No guarding.. No distention. There is no CVA tenderness. Genitourinary: Pelvic exam performed with the presence of a chaperone. No cervical motion tenderness. No adnexal mass or tenderness. No abnormal vaginal discharge or bleeding noticed in the vault. No external lesions. Musculoskeletal: Normal range of motion in all extremities. No joint effusions.  No lower extremity tenderness nor edema. Neurologic:  Normal speech and language. No gross focal neurologic deficits are appreciated. Speech is normal.  Skin:  Skin is warm, dry and intact. No rash noted. Psychiatric: Mood and affect are normal. Speech and behavior are normal. Patient exhibits appropriate insight  and judgment.  ____________________________________________    LABS (pertinent positives/negatives)  Labs Reviewed  CBC WITH DIFFERENTIAL/PLATELET - Abnormal; Notable for the following:    WBC 11.8 (*)    Neutro Abs 8.3 (*)    All other  components within normal limits  COMPREHENSIVE METABOLIC PANEL - Abnormal; Notable for the following:    Glucose, Bld 115 (*)    Total Bilirubin 0.1 (*)    All other components within normal limits  URINALYSIS COMPLETEWITH MICROSCOPIC ([facility])  - Abnormal; Notable for the following:    Color, Urine YELLOW (*)    APPearance HAZY (*)    Squamous Epithelial / LPF 6-30 (*)    All other components within normal limits  POCT PREGNANCY, URINE  POC URINE PREG, ED     ____________________________________________   EKG  None  ____________________________________________    RADIOLOGY  None  ____________________________________________   PROCEDURES  Procedure(s) performed: None  Critical Care performed: No  ____________________________________________   INITIAL IMPRESSION / ASSESSMENT AND PLAN / ED COURSE  Pertinent labs & imaging results that were available during my care of the patient were reviewed by me and considered in my medical decision making (see chart for details).  Patient with lower abdominal pain and complaints of dysuria. Urine with some red blood cells. No white blood cells in the urine. Pelvic exam performed. No CMT. No adnexal tenderness.  Given dysuria and red blood cells will treat for possible uti.  ____________________________________________   FINAL CLINICAL IMPRESSION(S) / ED DIAGNOSES  Final diagnoses:  Dysuria  Abdominal pain, unspecified abdominal location     Phineas SemenGraydon Kemet Nijjar, MD 03/03/15 2107

## 2015-03-03 NOTE — Discharge Instructions (Signed)
Please seek medical attention for any high fevers, chest pain, shortness of breath, change in behavior, persistent vomiting, bloody stool or any other new or concerning symptoms. ° °Abdominal Pain °Many things can cause abdominal pain. Usually, abdominal pain is not caused by a disease and will improve without treatment. It can often be observed and treated at home. Your health care provider will do a physical exam and possibly order blood tests and X-rays to help determine the seriousness of your pain. However, in many cases, more time must pass before a clear cause of the pain can be found. Before that point, your health care provider may not know if you need more testing or further treatment. °HOME CARE INSTRUCTIONS  °Monitor your abdominal pain for any changes. The following actions may help to alleviate any discomfort you are experiencing: °· Only take over-the-counter or prescription medicines as directed by your health care provider. °· Do not take laxatives unless directed to do so by your health care provider. °· Try a clear liquid diet (broth, tea, or water) as directed by your health care provider. Slowly move to a bland diet as tolerated. °SEEK MEDICAL CARE IF: °· You have unexplained abdominal pain. °· You have abdominal pain associated with nausea or diarrhea. °· You have pain when you urinate or have a bowel movement. °· You experience abdominal pain that wakes you in the night. °· You have abdominal pain that is worsened or improved by eating food. °· You have abdominal pain that is worsened with eating fatty foods. °· You have a fever. °SEEK IMMEDIATE MEDICAL CARE IF:  °· Your pain does not go away within 2 hours. °· You keep throwing up (vomiting). °· Your pain is felt only in portions of the abdomen, such as the right side or the left lower portion of the abdomen. °· You pass bloody or black tarry stools. °MAKE SURE YOU: °· Understand these instructions.   °· Will watch your condition.   °· Will  get help right away if you are not doing well or get worse.   °Document Released: 07/20/2005 Document Revised: 10/15/2013 Document Reviewed: 06/19/2013 °Exit[employer]® Patient Information ©2015 [employer]. This information is not intended to replace advice given to you by your health care provider. Make sure you discuss any questions you have with your health care provider. ° °

## 2015-06-08 ENCOUNTER — Encounter: Payer: Self-pay | Admitting: *Deleted

## 2015-06-08 ENCOUNTER — Emergency Department
Admission: EM | Admit: 2015-06-08 | Discharge: 2015-06-08 | Disposition: A | Discharge status: Home / Self Care | Payer: Self-pay | Attending: Emergency Medicine | Admitting: Emergency Medicine

## 2015-06-08 DIAGNOSIS — K5909 Other constipation: Secondary | ICD-10-CM | POA: Insufficient documentation

## 2015-06-08 DIAGNOSIS — Z720 Tobacco use: Secondary | ICD-10-CM | POA: Insufficient documentation

## 2015-06-08 DIAGNOSIS — K5904 Chronic idiopathic constipation: Secondary | ICD-10-CM

## 2015-06-08 DIAGNOSIS — Z3202 Encounter for pregnancy test, result negative: Secondary | ICD-10-CM | POA: Insufficient documentation

## 2015-06-08 LAB — COMPREHENSIVE METABOLIC PANEL
ALT: 16 U/L (ref 14–54)
ANION GAP: 9 (ref 5–15)
AST: 21 U/L (ref 15–41)
Albumin: 4.5 g/dL (ref 3.5–5.0)
Alkaline Phosphatase: 55 U/L (ref 38–126)
BILIRUBIN TOTAL: 0.2 mg/dL — AB (ref 0.3–1.2)
BUN: 12 mg/dL (ref 6–20)
CALCIUM: 9.4 mg/dL (ref 8.9–10.3)
CO2: 25 mmol/L (ref 22–32)
CREATININE: 0.79 mg/dL (ref 0.44–1.00)
Chloride: 105 mmol/L (ref 101–111)
Glucose, Bld: 104 mg/dL — ABNORMAL HIGH (ref 65–99)
Potassium: 3.5 mmol/L (ref 3.5–5.1)
SODIUM: 139 mmol/L (ref 135–145)
TOTAL PROTEIN: 7.4 g/dL (ref 6.5–8.1)

## 2015-06-08 LAB — URINALYSIS COMPLETE WITH MICROSCOPIC (ARMC ONLY)
BILIRUBIN URINE: NEGATIVE
Bacteria, UA: NONE SEEN
Glucose, UA: NEGATIVE mg/dL
Ketones, ur: NEGATIVE mg/dL
Leukocytes, UA: NEGATIVE
Nitrite: NEGATIVE
PH: 6 (ref 5.0–8.0)
Protein, ur: NEGATIVE mg/dL
Specific Gravity, Urine: 1.02 (ref 1.005–1.030)

## 2015-06-08 LAB — CBC
HCT: 35.1 % (ref 35.0–47.0)
HEMOGLOBIN: 11.9 g/dL — AB (ref 12.0–16.0)
MCH: 29.1 pg (ref 26.0–34.0)
MCHC: 33.9 g/dL (ref 32.0–36.0)
MCV: 85.8 fL (ref 80.0–100.0)
PLATELETS: 332 10*3/uL (ref 150–440)
RBC: 4.1 MIL/uL (ref 3.80–5.20)
RDW: 13.7 % (ref 11.5–14.5)
WBC: 8.1 10*3/uL (ref 3.6–11.0)

## 2015-06-08 LAB — POCT PREGNANCY, URINE: Preg Test, Ur: NEGATIVE

## 2015-06-08 LAB — LIPASE, BLOOD: Lipase: 20 U/L — ABNORMAL LOW (ref 22–51)

## 2015-06-08 MED ORDER — MAGNESIUM CITRATE PO SOLN
1.0000 | Freq: Once | ORAL | Status: AC
  Administered 2015-06-08: 1 via ORAL
  Filled 2015-06-08: qty 296

## 2015-06-08 MED ORDER — MAGNESIUM CITRATE PO SOLN
ORAL | Status: AC
  Filled 2015-06-08: qty 296

## 2015-06-08 NOTE — ED Provider Notes (Signed)
Time Seen: Approximately ----------------------------------------- 1:38 PM on 06/08/2015 -----------------------------------------    I have reviewed the triage notes  Chief Complaint: Abdominal Pain   History of Present Illness: Briana Lyons is a 34 y.o. female who presents with a 2 week history of intermittent constipation. Patient states she was on Klonopin for anxiety and wishes not to take the medication at this time. She stopped it on her own. She states that since stopping the medication she seems to develop some intermittent constipation. She states she's also been under a lot stress and a recent death of a family member. She denies any suicidal thoughts, homicidal thoughts, hallucinations. She states she does not wish to be on any anti-anxiety or depressants at this time. Patient states that the intermittent abdominal pain as crampy and points to both lower abdominal regions. She denies any dysuria, hematuria, urinary frequency. She denies any fever or chills or productive cough. She denies any vaginal discharge or bleeding.   History reviewed. No pertinent past medical history. Patient's had 2 C-sections History of anxiety There are no active problems to display for this patient.   Past Surgical History  Procedure Laterality Date  . Tubal ligation    . Appendectomy      Past Surgical History  Procedure Laterality Date  . Tubal ligation    . Appendectomy      No current outpatient prescriptions on file.  Allergies:  Codeine  Family History: No family history on file.  Social History: Social History  Substance Use Topics  . Smoking status: Current Every Day Smoker -- 1.00 packs/day  . Smokeless tobacco: None  . Alcohol Use: No     Review of Systems:   10 point review of systems was performed and was otherwise negative:  Constitutional: No fever Eyes: No visual disturbances ENT: No sore throat, ear pain Cardiac: No chest pain Respiratory: No  shortness of breath, wheezing, or stridor Abdomen: No abdominal pain, no vomiting, No diarrhea Endocrine: No weight loss, No night sweats Extremities: No peripheral edema, cyanosis Skin: No rashes, easy bruising Neurologic: No focal weakness, trouble with speech or swollowing Urologic: No dysuria, Hematuria, or urinary frequency Patient denies any suicidal thoughts, homicidal thoughts, hallucinations.  Physical Exam:  ED Triage Vitals  Enc Vitals Group     BP 06/08/15 1053 128/91 mmHg     Pulse Rate 06/08/15 1053 89     Resp 06/08/15 1053 18     Temp 06/08/15 1053 98.2 F (36.8 C)     Temp Source 06/08/15 1053 Oral     SpO2 06/08/15 1053 97 %     Weight 06/08/15 1053 180 lb (81.647 kg)     Height 06/08/15 1053 5\' 3"  (1.6 m)     Head Cir --      Peak Flow --      Pain Score 06/08/15 1054 8     Pain Loc --      Pain Edu? --      Excl. in GC? --     General: Awake , Alert , and Oriented times 3; GCS 15 Head: Normal cephalic , atraumatic Eyes: Pupils equal , round, reactive to light Nose/Throat: No nasal drainage, patent upper airway without erythema or exudate.  Neck: Supple, Full range of motion, No anterior adenopathy or palpable thyroid masses Lungs: Clear to ascultation without wheezes , rhonchi, or rales Heart: Regular rate, regular rhythm without murmurs , gallops , or rubs Abdomen: Soft, non tender without rebound, guarding ,  or rigidity; bowel sounds positive and symmetric in all 4 quadrants. No organomegaly .        Extremities: 2 plus symmetric pulses. No edema, clubbing or cyanosis Neurologic: normal ambulation, Motor symmetric without deficits, sensory intact Skin: warm, dry, no rashes   Labs:   All laboratory work was reviewed including any pertinent negatives or positives listed below:  Labs Reviewed  LIPASE, BLOOD - Abnormal; Notable for the following:    Lipase 20 (*)    All other components within normal limits  COMPREHENSIVE METABOLIC PANEL - Abnormal;  Notable for the following:    Glucose, Bld 104 (*)    Total Bilirubin 0.2 (*)    All other components within normal limits  CBC - Abnormal; Notable for the following:    Hemoglobin 11.9 (*)    All other components within normal limits  URINALYSIS COMPLETEWITH MICROSCOPIC ([facility] ONLY) - Abnormal; Notable for the following:    Color, Urine YELLOW (*)    APPearance HAZY (*)    Hgb urine dipstick 1+ (*)    Squamous Epithelial / LPF 6-30 (*)    All other components within normal limits  POC URINE PREG, ED  POCT PREGNANCY, URINE   laboratory work was reviewed with no significant abnormalities  EKG: None   Radiology: I felt no radiologic studies were clinically indicated.    Procedures: None Critical Care: None    ED Course: Patient's stay here was uneventful and felt she did not have a presentation consistent with a surgical abdomen. She was given magnesium citrate for constipation. She was also discharged with constipation instructions. She was advised to return especially if she develops a fever, persistent vomiting, bloody stool, or any other new complaints.  Differential diagnosis includes but is not exclusive to ovarian cyst, ovarian torsion, acute appendicitis, urinary tract infection, endometriosis, bowel obstruction, colitis, renal colic, gastroenteritis, etc.   Assessment: Functional constipation    Final diagnoses:  Constipation - functional     Plan: Patient was advised to return immediately if condition worsens. Patient was advised to follow up with her primary care physician or other specialized physicians involved and in their current assessment. Patient was given constipation and lower abdominal pain instructions.            Jennye Moccasin, MD 06/08/15 [phone]

## 2015-06-08 NOTE — ED Notes (Signed)
Pt reports being consipation for 1 week, pt reports lower abdominal pain over the last several days, pt reports nausea

## 2015-06-08 NOTE — Discharge Instructions (Signed)
Constipation °Constipation is when a person has fewer than three bowel movements a week, has difficulty having a bowel movement, or has stools that are dry, hard, or larger than normal. As people grow older, constipation is more common. If you try to fix constipation with medicines that make you have a bowel movement (laxatives), the problem may get worse. Long-term laxative use may cause the muscles of the colon to become weak. A low-fiber diet, not taking in enough fluids, and taking certain medicines may make constipation worse.  °CAUSES  °· Certain medicines, such as antidepressants, pain medicine, iron supplements, antacids, and water pills.   °· Certain diseases, such as diabetes, irritable bowel syndrome (IBS), thyroid disease, or depression.   °· Not drinking enough water.   °· Not eating enough fiber-rich foods.   °· Stress or travel.   °· Lack of physical activity or exercise.   °· Ignoring the urge to have a bowel movement.   °· Using laxatives too much.   °SIGNS AND SYMPTOMS  °· Having fewer than three bowel movements a week.   °· Straining to have a bowel movement.   °· Having stools that are hard, dry, or larger than normal.   °· Feeling full or bloated.   °· Pain in the lower abdomen.   °· Not feeling relief after having a bowel movement.   °DIAGNOSIS  °Your health care provider will take a medical history and perform a physical exam. Further testing may be done for severe constipation. Some tests may include: °· A barium enema X-ray to examine your rectum, colon, and, sometimes, your small intestine.   °· A sigmoidoscopy to examine your lower colon.   °· A colonoscopy to examine your entire colon. °TREATMENT  °Treatment will depend on the severity of your constipation and what is causing it. Some dietary treatments include drinking more fluids and eating more fiber-rich foods. Lifestyle treatments may include regular exercise. If these diet and lifestyle recommendations do not help, your health care  provider may recommend taking over-the-counter laxative medicines to help you have bowel movements. Prescription medicines may be prescribed if over-the-counter medicines do not work.  °HOME CARE INSTRUCTIONS  °· Eat foods that have a lot of fiber, such as fruits, vegetables, whole grains, and beans. °· Limit foods high in fat and processed sugars, such as french fries, hamburgers, cookies, candies, and soda.   °· A fiber supplement may be added to your diet if you cannot get enough fiber from foods.   °· Drink enough fluids to keep your urine clear or pale yellow.   °· Exercise regularly or as directed by your health care provider.   °· Go to the restroom when you have the urge to go. Do not hold it.   °· Only take over-the-counter or prescription medicines as directed by your health care provider. Do not take other medicines for constipation without talking to your health care provider first.   °SEEK IMMEDIATE MEDICAL CARE IF:  °· You have bright red blood in your stool.   °· Your constipation lasts for more than 4 days or gets worse.   °· You have abdominal or rectal pain.   °· You have thin, pencil-like stools.   °· You have unexplained weight loss. °MAKE SURE YOU:  °· Understand these instructions. °· Will watch your condition. °· Will get help right away if you are not doing well or get worse. °Document Released: 07/08/2004 Document Revised: 10/15/2013 Document Reviewed: 07/22/2013 °[employer]® Patient Information ©2015 [employer]. This information is not intended to replace advice given to you by your health care provider. Make sure you discuss any questions   you have with your health care provider.  Please return immediately if condition worsens. Please contact her primary physician or the physician you were given for referral. If you have any specialist physicians involved in her treatment and plan please also contact them. Thank you for using [facility]. Return especially for  fever, bloody urination, vaginal discharge or bleeding, or any other new concerns.

## 2015-06-08 NOTE — ED Notes (Signed)
Patient states she has been under increased stress over the last month with a death of a family member and stopped "nerve" medication over a month ago. Patient was on Klonopin.

## 2015-06-12 DIAGNOSIS — Z653 Problems related to other legal circumstances: Secondary | ICD-10-CM | POA: Insufficient documentation

## 2015-06-12 DIAGNOSIS — F319 Bipolar disorder, unspecified: Secondary | ICD-10-CM | POA: Insufficient documentation

## 2015-06-16 ENCOUNTER — Encounter: Payer: Self-pay | Admitting: Emergency Medicine

## 2015-06-16 ENCOUNTER — Emergency Department
Admission: EM | Admit: 2015-06-16 | Discharge: 2015-06-16 | Disposition: A | Discharge status: Home / Self Care | Payer: Self-pay | Attending: Emergency Medicine | Admitting: Emergency Medicine

## 2015-06-16 DIAGNOSIS — Z3202 Encounter for pregnancy test, result negative: Secondary | ICD-10-CM | POA: Insufficient documentation

## 2015-06-16 DIAGNOSIS — N898 Other specified noninflammatory disorders of vagina: Secondary | ICD-10-CM

## 2015-06-16 DIAGNOSIS — B373 Candidiasis of vulva and vagina: Secondary | ICD-10-CM | POA: Insufficient documentation

## 2015-06-16 DIAGNOSIS — Z720 Tobacco use: Secondary | ICD-10-CM | POA: Insufficient documentation

## 2015-06-16 DIAGNOSIS — R112 Nausea with vomiting, unspecified: Secondary | ICD-10-CM | POA: Insufficient documentation

## 2015-06-16 DIAGNOSIS — B3731 Acute candidiasis of vulva and vagina: Secondary | ICD-10-CM

## 2015-06-16 LAB — WET PREP, GENITAL
Clue Cells Wet Prep HPF POC: NONE SEEN
Trich, Wet Prep: NONE SEEN

## 2015-06-16 LAB — URINALYSIS COMPLETE WITH MICROSCOPIC (ARMC ONLY)
Bilirubin Urine: NEGATIVE
Glucose, UA: NEGATIVE mg/dL
Ketones, ur: NEGATIVE mg/dL
NITRITE: NEGATIVE
PROTEIN: NEGATIVE mg/dL
Specific Gravity, Urine: 1.006 (ref 1.005–1.030)
pH: 7 (ref 5.0–8.0)

## 2015-06-16 LAB — CHLAMYDIA/NGC RT PCR (ARMC ONLY)
Chlamydia Tr: NOT DETECTED
N gonorrhoeae: NOT DETECTED

## 2015-06-16 LAB — POCT PREGNANCY, URINE: PREG TEST UR: NEGATIVE

## 2015-06-16 MED ORDER — OXYCODONE-ACETAMINOPHEN 5-325 MG PO TABS: 1.0000 | ORAL_TABLET | ORAL | Status: DC | PRN

## 2015-06-16 MED ORDER — AZITHROMYCIN 250 MG PO TABS
1000.0000 mg | ORAL_TABLET | Freq: Once | ORAL | Status: AC
  Administered 2015-06-16: 1000 mg via ORAL
  Filled 2015-06-16: qty 4

## 2015-06-16 MED ORDER — OXYCODONE-ACETAMINOPHEN 5-325 MG PO TABS
1.0000 | ORAL_TABLET | Freq: Once | ORAL | Status: AC
  Administered 2015-06-16: 1 via ORAL
  Filled 2015-06-16: qty 1

## 2015-06-16 MED ORDER — LIDOCAINE HCL (PF) 1 % IJ SOLN
INTRAMUSCULAR | Status: AC
  Administered 2015-06-16: 5 mL
  Filled 2015-06-16: qty 5

## 2015-06-16 MED ORDER — CEFTRIAXONE SODIUM 250 MG IJ SOLR
250.0000 mg | Freq: Once | INTRAMUSCULAR | Status: AC
  Administered 2015-06-16: 250 mg via INTRAMUSCULAR
  Filled 2015-06-16: qty 250

## 2015-06-16 MED ORDER — FLUCONAZOLE 150 MG PO TABS: 150.0000 mg | ORAL_TABLET | Freq: Every day | ORAL | Status: DC

## 2015-06-16 NOTE — Discharge Instructions (Signed)
Candidal Vulvovaginitis Candidal vulvovaginitis is an infection of the vagina and vulva. The vulva is the skin around the opening of the vagina. This may cause itching and discomfort in and around the vagina.  HOME CARE  Only take medicine as told by your doctor.  Do not have sex (intercourse) until the infection is healed or as told by your doctor.  Practice safe sex.  Tell your sex partner about your infection.  Do not douche or use tampons.  Wear cotton underwear. Do not wear tight pants or panty hose.  Eat yogurt. This may help treat and prevent yeast infections. GET HELP RIGHT AWAY IF:   You have a fever.  Your problems get worse during treatment or do not get better in 3 days.  You have discomfort, irritation, or itching in your vagina or vulva area.  You have pain after sex.  You start to get belly (abdominal) pain. MAKE SURE YOU:  Understand these instructions.  Will watch your condition.  Will get help right away if you are not doing well or get worse. Document Released: 01/06/2009 Document Revised: 10/15/2013 Document Reviewed: 01/06/2009 [facility] Patient Information 2015 [place], [place]. This information is not intended to replace advice given to you by your health care provider. Make sure you discuss any questions you have with your health care provider.   FOLLOW UP WITH THE HEALTH DEPARTMENT ABOUT YOUR TEST RESULTS. TAKE DIFLUCAN FOR YEAST INFECTION PERCOCET FOR PAIN AS NEEDED

## 2015-06-16 NOTE — ED Notes (Signed)
Bilateral flank ..lower back pain had some n/v last pm..urinary freq

## 2015-06-16 NOTE — ED Provider Notes (Signed)
[facility] Emergency Department Provider Note  ____________________________________________  Time seen: Approximately 10:34 AM  I have reviewed the triage vital signs and the nursing notes.   HISTORY  Chief Complaint Back Pain   HPI Briana Lyons is a 34 y.o. female is here with complaint of bilateral low back pain. She states she has some nausea and vomiting last p.m. For approximately one week she has noticed some urinary frequency. She does have a history of urinary tract infections. She denies any injury to her back. She rates her pain 8 out of 10 at present.  After examination of her back and urinary results patient relates that she was seen [facility] the [facility] for an STD exam. She states her fianc cheated on her and since she has been having problems. She states that he has also had urinary discomfort but has not seen any medical facility for this thus far. She states she was diagnosed with a bacterial infection and was started on Flagyl on 819 and has not completed this course of medication yet.    History reviewed. No pertinent past medical history.  There are no active problems to display for this patient.   Past Surgical History  Procedure Laterality Date  . Tubal ligation    . Appendectomy      Current Outpatient Rx  Name  Route  Sig  Dispense  Refill  . fluconazole (DIFLUCAN) 150 MG tablet   Oral   Take 1 tablet (150 mg total) by mouth daily.   1 tablet   0   . oxyCODONE-acetaminophen (PERCOCET) 5-325 MG per tablet   Oral   Take 1 tablet by mouth every 4 (four) hours as needed for severe pain.   20 tablet   0     Allergies Codeine  No family history on file.  Social History Social History  Substance Use Topics  . Smoking status: Current Every Day Smoker -- 1.00 packs/day  . Smokeless tobacco: None  . Alcohol Use: No    Review of Systems Constitutional: Possible fever/chills ENT: No sore  throat. Cardiovascular: Denies chest pain. Respiratory: Denies shortness of breath. Gastrointestinal: No abdominal pain.  Positive nausea, positive vomiting.   Genitourinary: Positive for dysuria. Musculoskeletal: Positive for back pain. Skin: Negative for rash. Neurological: Negative for headaches, focal weakness or numbness.  10-point ROS otherwise negative.  ____________________________________________   PHYSICAL EXAM:  VITAL SIGNS: ED Triage Vitals  Enc Vitals Group     BP 06/16/15 1020 119/82 mmHg     Pulse Rate 06/16/15 1020 93     Resp --      Temp 06/16/15 1020 98.2 F (36.8 C)     Temp Source 06/16/15 1020 Oral     SpO2 06/16/15 1020 100 %     Weight 06/16/15 1027 180 lb (81.647 kg)     Height 06/16/15 1027 5\' 2"  (1.575 m)     Head Cir --      Peak Flow --      Pain Score 06/16/15 1027 8     Pain Loc --      Pain Edu? --      Excl. in GC? --     Constitutional: Alert and oriented. Well appearing and in no acute distress. Eyes: Conjunctivae are normal. PERRL. EOMI. Head: Atraumatic. Nose: No congestion/rhinnorhea. Neck: No stridor.   Cardiovascular: Normal rate, regular rhythm. Grossly normal heart sounds.  Good peripheral circulation. Respiratory: Normal respiratory effort.  No retractions. Lungs CTAB.  Gastrointestinal: Soft and nontender. No distention.  No CVA tenderness. Genitourinary: Pelvic exam with moderate tenderness. There is white thick discharge noted in the vaginal vault. Cervix does not appear to be inflamed. There is moderate tenderness bilateral adnexal areas and on cervical motion tenderness. Musculoskeletal: No lower extremity tenderness nor edema.  No joint effusions. Neurologic:  Normal speech and language. No gross focal neurologic deficits are appreciated. No gait instability. Skin:  Skin is warm, dry and intact. No rash noted. Psychiatric: Mood and affect are normal. Speech and behavior are  normal.  ____________________________________________   LABS (all labs ordered are listed, but only abnormal results are displayed)  Labs Reviewed  WET PREP, GENITAL - Abnormal; Notable for the following:    Yeast Wet Prep HPF POC MODERATE (*)    WBC, Wet Prep HPF POC MODERATE (*)    All other components within normal limits  URINALYSIS COMPLETEWITH MICROSCOPIC ([facility] ONLY) - Abnormal; Notable for the following:    Color, Urine STRAW (*)    APPearance CLEAR (*)    Hgb urine dipstick 1+ (*)    Leukocytes, UA TRACE (*)    Bacteria, UA RARE (*)    Squamous Epithelial / LPF 6-30 (*)    All other components within normal limits  CHLAMYDIA/NGC RT PCR ([facility] ONLY)  POC URINE PREG, ED  POCT PREGNANCY, URINE     PROCEDURES  Procedure(s) performed: None  Critical Care performed: No  ____________________________________________   INITIAL IMPRESSION / ASSESSMENT AND PLAN / ED COURSE  Pertinent labs & imaging results that were available during my care of the patient were reviewed by me and considered in my medical decision making (see chart for details).  Moderate WBCs were seen on wet prep along with yeast. This was discussed with patient and in addition to her physical exam is decided that we would go ahead and treat for STD. ____________________________________________   FINAL CLINICAL IMPRESSION(S) / ED DIAGNOSES  Final diagnoses:  Yeast vaginitis  Vaginal discharge      Tommi Rumps, PA-C 06/16/15 1428  Emily Filbert, MD 06/16/15 1524

## 2015-06-18 ENCOUNTER — Telehealth: Payer: Self-pay | Admitting: Emergency Medicine

## 2015-06-18 NOTE — ED Notes (Signed)
Pt called for results of std tests.  i gave her results. She says she is feeling better.

## 2015-09-23 ENCOUNTER — Encounter: Payer: Self-pay | Admitting: Emergency Medicine

## 2015-09-23 ENCOUNTER — Emergency Department
Admission: EM | Admit: 2015-09-23 | Discharge: 2015-09-24 | Disposition: A | Discharge status: Home / Self Care | Payer: Self-pay | Attending: Student | Admitting: Student

## 2015-09-23 DIAGNOSIS — G8929 Other chronic pain: Secondary | ICD-10-CM | POA: Insufficient documentation

## 2015-09-23 DIAGNOSIS — F17200 Nicotine dependence, unspecified, uncomplicated: Secondary | ICD-10-CM | POA: Insufficient documentation

## 2015-09-23 DIAGNOSIS — Z79899 Other long term (current) drug therapy: Secondary | ICD-10-CM | POA: Insufficient documentation

## 2015-09-23 DIAGNOSIS — M25512 Pain in left shoulder: Secondary | ICD-10-CM | POA: Insufficient documentation

## 2015-09-23 DIAGNOSIS — M542 Cervicalgia: Secondary | ICD-10-CM | POA: Insufficient documentation

## 2015-09-23 DIAGNOSIS — M25511 Pain in right shoulder: Secondary | ICD-10-CM | POA: Insufficient documentation

## 2015-09-23 MED ORDER — IBUPROFEN 800 MG PO TABS
800.0000 mg | ORAL_TABLET | Freq: Once | ORAL | Status: AC
  Administered 2015-09-23: 800 mg via ORAL
  Filled 2015-09-23: qty 1

## 2015-09-23 MED ORDER — ORPHENADRINE CITRATE 30 MG/ML IJ SOLN
60.0000 mg | Freq: Two times a day (BID) | INTRAMUSCULAR | Status: DC
  Administered 2015-09-23: 60 mg via INTRAMUSCULAR
  Filled 2015-09-23 (×2): qty 2
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
## 2015-09-24 MED ORDER — IBUPROFEN 800 MG PO TABS: 800.0000 mg | ORAL_TABLET | Freq: Three times a day (TID) | ORAL | Status: DC | PRN

## 2015-09-24 MED ORDER — CYCLOBENZAPRINE HCL 10 MG PO TABS: 10.0000 mg | ORAL_TABLET | Freq: Three times a day (TID) | ORAL | Status: DC | PRN
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
## 2015-09-24 NOTE — Discharge Instructions (Signed)

## 2016-01-06 ENCOUNTER — Emergency Department
Admission: EM | Admit: 2016-01-06 | Discharge: 2016-01-06 | Disposition: A | Discharge status: Home / Self Care | Payer: Self-pay | Attending: Emergency Medicine | Admitting: Emergency Medicine

## 2016-01-06 ENCOUNTER — Encounter: Payer: Self-pay | Admitting: Emergency Medicine

## 2016-01-06 DIAGNOSIS — F17200 Nicotine dependence, unspecified, uncomplicated: Secondary | ICD-10-CM | POA: Insufficient documentation

## 2016-01-06 DIAGNOSIS — Z9851 Tubal ligation status: Secondary | ICD-10-CM | POA: Insufficient documentation

## 2016-01-06 DIAGNOSIS — Z79899 Other long term (current) drug therapy: Secondary | ICD-10-CM | POA: Insufficient documentation

## 2016-01-06 DIAGNOSIS — K0889 Other specified disorders of teeth and supporting structures: Secondary | ICD-10-CM

## 2016-01-06 DIAGNOSIS — K047 Periapical abscess without sinus: Secondary | ICD-10-CM | POA: Insufficient documentation

## 2016-01-06 MED ORDER — AMOXICILLIN 500 MG PO CAPS: 500.0000 mg | ORAL_CAPSULE | Freq: Three times a day (TID) | ORAL | Status: DC

## 2016-01-06 MED ORDER — IBUPROFEN 600 MG PO TABS: 600.0000 mg | ORAL_TABLET | Freq: Three times a day (TID) | ORAL | Status: DC | PRN

## 2016-01-06 NOTE — Discharge Instructions (Signed)
Dental Abscess A dental abscess is pus in or around a tooth. HOME CARE  Take medicines only as told by your dentist.  If you were prescribed antibiotic medicine, finish all of it even if you start to feel better.  Rinse your mouth (gargle) often with salt water.  Do not drive or use heavy machinery, like a lawn mower, while taking pain medicine.  Do not apply heat to the outside of your mouth.  Keep all follow-up visits as told by your dentist. This is important. GET HELP IF:  Your pain is worse, and medicine does not help. GET HELP RIGHT AWAY IF:  You have a fever or chills.  Your symptoms suddenly get worse.  You have a very bad headache.  You have problems breathing or swallowing.  You have trouble opening your mouth.  You have puffiness (swelling) in your neck or around your eye.   This information is not intended to replace advice given to you by your health care provider. Make sure you discuss any questions you have with your health care provider.   Document Released: 02/24/2015 Document Reviewed: 02/24/2015 Elsevier Interactive Patient Education [employer]  Begin taking antibiotics and ibuprofen as needed for pain and inflammation. Call and make an appointment with a dentist. A list of dental clinics is listed also on your discharge papers.  OPTIONS FOR DENTAL FOLLOW UP CARE  [place] [facility] - Local Safety Net Dental Clinics [email or web address]   [facility] [phone])  [facility] [phone])  [place] [phone] ext [phone])  [facility] [phone])  [facility] [phone]) This clinic caters to the indigent population and is on a lottery system. Location: [employer] [facility], [employer], [address] [place] Clinic Hours: Wednesdays from 6pm - 9pm, patients seen by a lottery  system. For dates, call or go to [email or web address] Services: Cleanings, fillings and simple extractions. Payment Options: DENTAL WORK IS FREE OF CHARGE. Bring proof of income or support. Best way to get seen: Arrive at 5:15 pm - this is a lottery, NOT first come/first serve, so arriving earlier will not increase your chances of being seen.     [facility] [phone] Select option 1 for emergencies   Location: [facility], [place], [address], [place] Clinic Hours: No walk-ins accepted - call the day before to schedule an appointment. Check in times are 9:30 am and 1:30 pm. Services: Simple extractions, temporary fillings, pulpectomy/pulp debridement, uncomplicated abscess drainage. Payment Options: PAYMENT IS DUE AT THE TIME OF SERVICE.  Fee is usually $100-200, additional surgical procedures (e.g. abscess drainage) may be extra. Cash, checks, Visa/MasterCard accepted.  Can file Medicaid if patient is covered for dental - patient should call case worker to check. No discount for [facility] patients. Best way to get seen: MUST call the day before and get onto the schedule. Can usually be seen the next 1-2 days. No walk-ins accepted.     [facility] [phone]   Location: [facility], [address], [place] Clinic Hours: M, W, Th, F 8am or 1:30pm, Tues 9a or 1:30 - first come/first served. Services: Simple extractions, temporary fillings, uncomplicated abscess drainage.  You do not need to be an [facility] resident. Payment Options: PAYMENT IS DUE AT THE TIME OF SERVICE. Dental insurance, otherwise sliding scale - bring proof of income or support. Depending on income  and treatment needed, cost is usually $50-200. Best way to get seen: Arrive early as it is first come/first served.     [facility] [phone]    Location: [address] Clinic Hours: Mon-Thu 8a-5p Services: Most basic dental services including extractions and fillings. Payment Options: PAYMENT IS DUE AT THE TIME OF SERVICE. Sliding scale, up to 50% off - bring proof if income or support. Medicaid with dental option accepted. Best way to get seen: Call to schedule an appointment, can usually be seen within 2 weeks OR they will try to see walk-ins - show up at 8a or 2p (you may have to wait).     [facility] [phone] [place] RESIDENTS ONLY   Location: [facility], [address], [place], [place] [id number] Clinic Hours: By appointment only. Monday - Thursday 8am-5pm, Friday 8am-12pm Services: Cleanings, fillings, extractions. Payment Options: PAYMENT IS DUE AT THE TIME OF SERVICE. Cash, Visa or MasterCard. Sliding scale - $30 minimum per service. Best way to get seen: Come in to office, complete packet and make an appointment - need proof of income or support monies for each household member and proof of [place] [facility] [place] residence. Usually takes about a month to get in.     [facility] [phone]   Location: [address] [facility] Hours: Walk-in Urgent Care Dental Services are offered Monday-Friday mornings only. The numbers of emergencies accepted daily is limited to the number of providers available. Maximum 15 - Mondays, Wednesdays & Thursdays Maximum 10 - Tuesdays & Fridays Services: You do not need to be a [place] resident to be seen for a dental emergency. Emergencies are defined as pain, swelling, abnormal bleeding, or dental trauma. Walkins will receive x-rays if needed. NOTE: Dental cleaning is not an emergency. Payment Options: PAYMENT IS DUE AT THE TIME OF SERVICE. Minimum co-pay is $40.00 for uninsured patients. Minimum co-pay is $3.00 for Medicaid with dental coverage. Dental Insurance is  accepted and must be presented at time of visit. Medicare does not cover dental. Forms of payment: Cash, credit card, checks. Best way to get seen: If not previously registered with the clinic, walk-in dental registration begins at 7:15 am and is on a first come/first serve basis. If previously registered with the clinic, call to make an appointment.     [facility] [phone] [place] RESIDENTS ONLY   Location: [address], [place], [place] Clinic Hours: Mon-Thu 10a-2p Services: Extractions only! Payment Options: FREE (donations accepted) - bring proof of income or support Best way to get seen: Call and schedule an appointment OR come at 8am on the 1st Monday of every month (except for holidays) when it is first come/first served.     [facility] [phone]   Location: [address], [place] Clinic Hours: Friday mornings Services, Payment Options, Best way to get seen: Call for info

## 2016-01-06 NOTE — ED Provider Notes (Signed)
[facility] Emergency Department Provider Note ____________________________________________  Time seen: Approximately 9:31 AM  I have reviewed the triage vital signs and the nursing notes.   HISTORY  Chief Complaint Dental Pain   HPI Briana Lyons is a 35 y.o. female is here complaining of left-sided facial swelling secondary to a dental abscess. Patient states she began having tooth pain approximate 4 days ago and has been calling to get a dentist appointment. She has a number of the dentist in [place] that she intends to call today but does not have an appointment. Patient states that the pain is worse when she is smoking. LMP was 2-1/2 weeks ago and patient had a tubal ligation. She rates her pain as 10 over 10.   History reviewed. No pertinent past medical history.  There are no active problems to display for this patient.   Past Surgical History  Procedure Laterality Date  . Tubal ligation    . Appendectomy    . Cesarean section      Current Outpatient Rx  Name  Route  Sig  Dispense  Refill  . amoxicillin (AMOXIL) 500 MG capsule   Oral   Take 1 capsule (500 mg total) by mouth 3 (three) times daily.   30 capsule   0   . cyclobenzaprine (FLEXERIL) 10 MG tablet   Oral   Take 1 tablet (10 mg total) by mouth 3 (three) times daily as needed for muscle spasms.   30 tablet   0   . fluconazole (DIFLUCAN) 150 MG tablet   Oral   Take 1 tablet (150 mg total) by mouth daily.   1 tablet   0   . ibuprofen (ADVIL,MOTRIN) 600 MG tablet   Oral   Take 1 tablet (600 mg total) by mouth every 8 (eight) hours as needed.   30 tablet   0   . oxyCODONE-acetaminophen (PERCOCET) 5-325 MG per tablet   Oral   Take 1 tablet by mouth every 4 (four) hours as needed for severe pain.   20 tablet   0     Allergies Codeine  No family history on file.  Social History Social History  Substance Use Topics  . Smoking status: Current Every Day Smoker --  1.00 packs/day  . Smokeless tobacco: None  . Alcohol Use: No    Review of Systems Constitutional: No fever/chills ENT: Positive dental pain. Cardiovascular: Denies chest pain. Respiratory: Denies shortness of breath. Gastrointestinal:  No nausea, no vomiting.  Neurological: Negative for headaches  10-point ROS otherwise negative.  ____________________________________________   PHYSICAL EXAM:  VITAL SIGNS: ED Triage Vitals  Enc Vitals Group     BP 01/06/16 0903 142/79 mmHg     Pulse Rate 01/06/16 0903 88     Resp 01/06/16 0903 20     Temp 01/06/16 0903 97.8 F (36.6 C)     Temp Source 01/06/16 0903 Oral     SpO2 01/06/16 0903 100 %     Weight --      Height --      Head Cir --      Peak Flow --      Pain Score 01/06/16 0905 10     Pain Loc --      Pain Edu? --      Excl. in GC? --     Constitutional: Alert and oriented. Well appearing and in no acute distress. Eyes: Conjunctivae are normal. PERRL. EOMI. Head: Atraumatic. Nose: No congestion/rhinnorhea. Mouth/Throat: Mucous  membranes are moist.  Oropharynx non-erythematous. Left lower molars are extremely bad repair with only partial dictation present . There is some soft tissue edema and tenderness to palpation with a tongue depressor. Neck: No stridor.  Supple Hematological/Lymphatic/Immunilogical: No cervical lymphadenopathy. Respiratory: Normal respiratory effort.   Musculoskeletal: Moves upper and lower extremities without any difficulty. Normal gait was noted. Neurologic:  Normal speech and language. No gross focal neurologic deficits are appreciated. No gait instability. Skin:  Skin is warm, dry and intact. No rash noted. Psychiatric: Mood and affect are normal. Speech and behavior are normal.  ____________________________________________   LABS (all labs ordered are listed, but only abnormal results are displayed)  Labs Reviewed - No data to display  PROCEDURES  Procedure(s) performed:  None  Critical Care performed: No  ____________________________________________   INITIAL IMPRESSION / ASSESSMENT AND PLAN / ED COURSE  Pertinent labs & imaging results that were available during my care of the patient were reviewed by me and considered in my medical decision making (see chart for details).  Patient was given list of dental clinics to follow up with. She is also given amoxicillin 500 mg 3 times a day for 10 days and ibuprofen as needed for pain. She was encouraged to call the dentist office today for an appointment. ____________________________________________   FINAL CLINICAL IMPRESSION(S) / ED DIAGNOSES  Final diagnoses:  Dental abscess  Pain, dental      Tommi Rumps, PA-C 01/06/16 1430  Phineas Semen, MD 01/06/16 [phone]

## 2016-01-06 NOTE — ED Notes (Signed)
Pt presents with left sided dental pain and swelling for four days.

## 2016-04-20 ENCOUNTER — Emergency Department
Admission: EM | Admit: 2016-04-20 | Discharge: 2016-04-20 | Disposition: A | Discharge status: Home / Self Care | Payer: Self-pay | Attending: Emergency Medicine | Admitting: Emergency Medicine

## 2016-04-20 DIAGNOSIS — Z792 Long term (current) use of antibiotics: Secondary | ICD-10-CM | POA: Insufficient documentation

## 2016-04-20 DIAGNOSIS — B9689 Other specified bacterial agents as the cause of diseases classified elsewhere: Secondary | ICD-10-CM

## 2016-04-20 DIAGNOSIS — F17200 Nicotine dependence, unspecified, uncomplicated: Secondary | ICD-10-CM | POA: Insufficient documentation

## 2016-04-20 DIAGNOSIS — Z79899 Other long term (current) drug therapy: Secondary | ICD-10-CM | POA: Insufficient documentation

## 2016-04-20 DIAGNOSIS — N760 Acute vaginitis: Secondary | ICD-10-CM | POA: Insufficient documentation

## 2016-04-20 DIAGNOSIS — J029 Acute pharyngitis, unspecified: Secondary | ICD-10-CM | POA: Insufficient documentation

## 2016-04-20 LAB — URINALYSIS COMPLETE WITH MICROSCOPIC (ARMC ONLY)
BILIRUBIN URINE: NEGATIVE
Bacteria, UA: NONE SEEN
Glucose, UA: NEGATIVE mg/dL
KETONES UR: NEGATIVE mg/dL
Nitrite: NEGATIVE
PH: 5 (ref 5.0–8.0)
Protein, ur: NEGATIVE mg/dL
Specific Gravity, Urine: 1.019 (ref 1.005–1.030)

## 2016-04-20 LAB — POCT PREGNANCY, URINE: Preg Test, Ur: NEGATIVE

## 2016-04-20 LAB — WET PREP, GENITAL
SPERM: NONE SEEN
Trich, Wet Prep: NONE SEEN
Yeast Wet Prep HPF POC: NONE SEEN

## 2016-04-20 LAB — CHLAMYDIA/NGC RT PCR (ARMC ONLY)
CHLAMYDIA TR: NOT DETECTED
N GONORRHOEAE: NOT DETECTED

## 2016-04-20 MED ORDER — METRONIDAZOLE 500 MG PO TABS: 500.0000 mg | ORAL_TABLET | Freq: Two times a day (BID) | ORAL | Status: AC

## 2016-04-20 MED ORDER — METRONIDAZOLE 500 MG PO TABS
ORAL_TABLET | ORAL | Status: AC
  Administered 2016-04-20: 500 mg via ORAL
  Filled 2016-04-20: qty 1

## 2016-04-20 MED ORDER — METRONIDAZOLE 500 MG PO TABS
500.0000 mg | ORAL_TABLET | Freq: Once | ORAL | Status: AC
  Administered 2016-04-20: 500 mg via ORAL

## 2016-04-20 NOTE — ED Provider Notes (Signed)
[facility] Emergency Department Provider Note   ____________________________________________  Time seen: Approximately 230pm  I have reviewed the triage vital signs and the nursing notes.   HISTORY  Chief Complaint Vaginal Discharge  Briana Lyons is a 35 y.o. female  With a history of a tubal ligation and an appendectomy was presenting to the emergency department today with 1 week of foul-smelling vaginal discharge. She says that she also has intermittent lower abdominal cramping. Does not report any nausea vomiting or diarrhea. Does not think that this is a sexually transmitted disease because she says that she has not been sexually active in some time.    She is also complaining of a sore throat over the past 2 weeks. Says that she feels a drip in the back of her throat that makes her cough. Says that she has also had intermittent ear pressure. I tried some cough drops which have provided minimal relief. Denies any fever. No known sick contacts. Says that her ears and also felt itchy.    History reviewed. No pertinent past medical history.  There are no active problems to display for this patient.   Past Surgical History  Procedure Laterality Date  . Tubal ligation    . Appendectomy    . Cesarean section      Current Outpatient Rx  Name  Route  Sig  Dispense  Refill  . amoxicillin (AMOXIL) 500 MG capsule   Oral   Take 1 capsule (500 mg total) by mouth 3 (three) times daily.   30 capsule   0   . cyclobenzaprine (FLEXERIL) 10 MG tablet   Oral   Take 1 tablet (10 mg total) by mouth 3 (three) times daily as needed for muscle spasms.   30 tablet   0   . fluconazole (DIFLUCAN) 150 MG tablet   Oral   Take 1 tablet (150 mg total) by mouth daily.   1 tablet   0   . ibuprofen (ADVIL,MOTRIN) 600 MG tablet   Oral   Take 1 tablet (600 mg total) by mouth every 8 (eight) hours as needed.   30 tablet   0   . oxyCODONE-acetaminophen  (PERCOCET) 5-325 MG per tablet   Oral   Take 1 tablet by mouth every 4 (four) hours as needed for severe pain.   20 tablet   0     Allergies Codeine  No family history on file.  Social History Social History  Substance Use Topics  . Smoking status: Current Every Day Smoker -- 1.00 packs/day  . Smokeless tobacco: None  . Alcohol Use: No    Review of Systems Constitutional: No fever/chills Eyes: No visual changes. ENT: No sore throat. Cardiovascular: Denies chest pain. Respiratory: Denies shortness of breath. Gastrointestinal:  No nausea, no vomiting.  No diarrhea.  No constipation. Genitourinary: Negative for dysuria. Musculoskeletal: Negative for back pain. Skin: Negative for rash. Neurological: Negative for headaches, focal weakness or numbness.  10-point ROS otherwise negative.  ____________________________________________   PHYSICAL EXAM:  VITAL SIGNS: ED Triage Vitals  Enc Vitals Group     BP 04/20/16 1423 132/96 mmHg     Pulse Rate 04/20/16 1423 124     Resp 04/20/16 1423 20     Temp 04/20/16 1423 98.3 F (36.8 C)     Temp Source 04/20/16 1423 Oral     SpO2 04/20/16 1423 99 %     Weight 04/20/16 1423 200 lb (90.719 kg)  Height 04/20/16 1423 5\' 3"  (1.6 m)     Head Cir --      Peak Flow --      Pain Score 04/20/16 1447 8     Pain Loc --      Pain Edu? --      Excl. in GC? --     Constitutional: Alert and oriented. Well appearing and in no acute distress. Eyes: Conjunctivae are normal. PERRL. EOMI. Head: Atraumatic. Serous fluid behind both ears. Nose: No congestion/rhinnorhea. Mouth/Throat: Mucous membranes are moist.  Oropharynx non-erythematous. No tonsillar swelling or exudate. Neck: No stridor.   Cardiovascular: Normal rate, regular rhythm. Grossly normal heart sounds.  Good peripheral circulation. Respiratory: Normal respiratory effort.  No retractions. Lungs CTAB. Gastrointestinal: Soft and nontender. No distention. No abdominal bruits.  No CVA tenderness. Genitourinary: Normal external exam. Speculum exam with a moderate amount of white discharge. Bimanual exam with minimal CMT. No uterine or adnexal tenderness nor masses. Musculoskeletal: No lower extremity tenderness nor edema.  No joint effusions. Neurologic:  Normal speech and language. No gross focal neurologic deficits are appreciated. No gait instability. Skin:  Skin is warm, dry and intact. No rash noted. Psychiatric: Mood and affect are normal. Speech and behavior are normal.  ____________________________________________   LABS (all labs ordered are listed, but only abnormal results are displayed)  Labs Reviewed  WET PREP, GENITAL - Abnormal; Notable for the following:    Clue Cells Wet Prep HPF POC PRESENT (*)    WBC, Wet Prep HPF POC MODERATE (*)    All other components within normal limits  URINALYSIS COMPLETEWITH MICROSCOPIC ([facility] ONLY) - Abnormal; Notable for the following:    Color, Urine YELLOW (*)    APPearance CLOUDY (*)    Hgb urine dipstick 1+ (*)    Leukocytes, UA 2+ (*)    Squamous Epithelial / LPF TOO NUMEROUS TO COUNT (*)    All other components within normal limits  CHLAMYDIA/NGC RT PCR ([facility] ONLY)  POCT PREGNANCY, URINE  POC URINE PREG, ED   ____________________________________________  EKG   ____________________________________________  RADIOLOGY   ____________________________________________   PROCEDURES    ____________________________________________   INITIAL IMPRESSION / ASSESSMENT AND PLAN / ED COURSE  Pertinent labs & imaging results that were available during my care of the patient were reviewed by me and considered in my medical decision making (see chart for details).  ----------------------------------------- 4:16 PM on 04/20/2016 -----------------------------------------  Patient updated about her lab results. Wet prep positive for clue cells. We'll treat for bacterial vaginosis. I also  rechecked patient's heart rate which was 97 bpm. Hospital allergic cause of the patient's sore throat as well as eustachian tube dysfunction which could be causing the fluid buildup behind the eardrums bilaterally. She has Benadryl at home which she will try for this issue. Will give follow-up with [facility]. Also counseled the patient about not drinking while taking Flagyl. She is understanding of interaction between now on Flagyl will not be drinking. Other more serious causes for throat pain were considered. If there is a strong consideration of an STD causing the vaginal discharge and I would be suspecting a bacterial pharyngitis or retropharyngeal issue. However, the patient is ranging her neck freely. She is also nontoxic in appearance. Controlling secretions. I think it is far less likely that her sore throat is secondary to this type of cause. ____________________________________________   FINAL CLINICAL IMPRESSION(S) / ED DIAGNOSES  Bacterial vaginosis. Sore throat.    NEW MEDICATIONS  STARTED DURING THIS VISIT:  New Prescriptions   No medications on file     Note:  This document was prepared using Dragon voice recognition software and may include unintentional dictation errors.    Myrna Blazeravid Matthew Schaevitz, MD 04/20/16 [phone]

## 2016-04-20 NOTE — Discharge Instructions (Signed)
Bacterial Vaginosis Bacterial vaginosis is a vaginal infection that occurs when the normal balance of bacteria in the vagina is disrupted. It results from an overgrowth of certain bacteria. This is the most common vaginal infection in women of childbearing age. Treatment is important to prevent complications, especially in pregnant women, as it can cause a premature delivery. CAUSES  Bacterial vaginosis is caused by an increase in harmful bacteria that are normally present in smaller amounts in the vagina. Several different kinds of bacteria can cause bacterial vaginosis. However, the reason that the condition develops is not fully understood. RISK FACTORS Certain activities or behaviors can put you at an increased risk of developing bacterial vaginosis, including:  Having a new sex partner or multiple sex partners.  Douching.  Using an intrauterine device (IUD) for contraception. Women do not get bacterial vaginosis from toilet seats, bedding, swimming pools, or contact with objects around them. SIGNS AND SYMPTOMS  Some women with bacterial vaginosis have no signs or symptoms. Common symptoms include:  Grey vaginal discharge.  A fishlike odor with discharge, especially after sexual intercourse.  Itching or burning of the vagina and vulva.  Burning or pain with urination. DIAGNOSIS  Your health care provider will take a medical history and examine the vagina for signs of bacterial vaginosis. A sample of vaginal fluid may be taken. Your health care provider will look at this sample under a microscope to check for bacteria and abnormal cells. A vaginal pH test may also be done.  TREATMENT  Bacterial vaginosis may be treated with antibiotic medicines. These may be given in the form of a pill or a vaginal cream. A second round of antibiotics may be prescribed if the condition comes back after treatment. Because bacterial vaginosis increases your risk for sexually transmitted diseases, getting  treated can help reduce your risk for chlamydia, gonorrhea, HIV, and herpes. HOME CARE INSTRUCTIONS   Only take over-the-counter or prescription medicines as directed by your health care provider.  If antibiotic medicine was prescribed, take it as directed. Make sure you finish it even if you start to feel better.  Tell all sexual partners that you have a vaginal infection. They should see their health care provider and be treated if they have problems, such as a mild rash or itching.  During treatment, it is important that you follow these instructions:  Avoid sexual activity or use condoms correctly.  Do not douche.  Avoid alcohol as directed by your health care provider.  Avoid breastfeeding as directed by your health care provider. SEEK MEDICAL CARE IF:   Your symptoms are not improving after 3 days of treatment.  You have increased discharge or pain.  You have a fever. MAKE SURE YOU:   Understand these instructions.  Will watch your condition.  Will get help right away if you are not doing well or get worse. FOR MORE INFORMATION  Centers for Disease Control and Prevention, Division of STD Prevention: [employer]raud[employer] American Sexual Health Association (ASHA): www.ashastd.org    This information is not intended to replace advice given to you by your health care provider. Make sure you discuss any questions you have with your health care provider.   Document Released: 10/10/2005 Document Revised: 10/31/2014 Document Reviewed: 05/22/2013 Elsevier Interactive Patient Education 2016 Elsevier Inc.  Sore Throat A sore throat is pain, burning, irritation, or scratchiness of the throat. There is often pain or tenderness when swallowing or talking. A sore throat may be accompanied by other symptoms, such as coughing,  sneezing, fever, and swollen neck glands. A sore throat is often the first sign of another sickness, such as a cold, flu, strep throat, or mononucleosis (commonly  known as mono). Most sore throats go away without medical treatment. CAUSES  The most common causes of a sore throat include:  A viral infection, such as a cold, flu, or mono.  A bacterial infection, such as strep throat, tonsillitis, or whooping cough.  Seasonal allergies.  Dryness in the air.  Irritants, such as smoke or pollution.  Gastroesophageal reflux disease (GERD). HOME CARE INSTRUCTIONS   Only take over-the-counter medicines as directed by your caregiver.  Drink enough fluids to keep your urine clear or pale yellow.  Rest as needed.  Try using throat sprays, lozenges, or sucking on hard candy to ease any pain (if older than 4 years or as directed).  Sip warm liquids, such as broth, herbal tea, or warm water with honey to relieve pain temporarily. You may also eat or drink cold or frozen liquids such as frozen ice pops.  Gargle with salt water (mix 1 tsp salt with 8 oz of water).  Do not smoke and avoid secondhand smoke.  Put a cool-mist humidifier in your bedroom at night to moisten the air. You can also turn on a hot shower and sit in the bathroom with the door closed for 5-10 minutes. SEEK IMMEDIATE MEDICAL CARE IF:  You have difficulty breathing.  You are unable to swallow fluids, soft foods, or your saliva.  You have increased swelling in the throat.  Your sore throat does not get better in 7 days.  You have nausea and vomiting.  You have a fever or persistent symptoms for more than 2-3 days.  You have a fever and your symptoms suddenly get worse. MAKE SURE YOU:   Understand these instructions.  Will watch your condition.  Will get help right away if you are not doing well or get worse.   This information is not intended to replace advice given to you by your health care provider. Make sure you discuss any questions you have with your health care provider.   Document Released: 11/17/2004 Document Revised: 10/31/2014 Document Reviewed:  06/17/2012 Elsevier Interactive Patient Education [employer]2016 Elsevier Inc.

## 2016-04-20 NOTE — ED Notes (Signed)
Pt c/o lower abd pain with foul thick vaginal discharge for the past week.

## 2016-07-05 ENCOUNTER — Encounter: Payer: Self-pay | Admitting: Emergency Medicine

## 2016-07-05 ENCOUNTER — Emergency Department
Admission: EM | Admit: 2016-07-05 | Discharge: 2016-07-05 | Disposition: A | Discharge status: Home / Self Care | Payer: Self-pay | Attending: Emergency Medicine | Admitting: Emergency Medicine

## 2016-07-05 DIAGNOSIS — F17200 Nicotine dependence, unspecified, uncomplicated: Secondary | ICD-10-CM | POA: Insufficient documentation

## 2016-07-05 DIAGNOSIS — N760 Acute vaginitis: Secondary | ICD-10-CM | POA: Insufficient documentation

## 2016-07-05 DIAGNOSIS — J0100 Acute maxillary sinusitis, unspecified: Secondary | ICD-10-CM | POA: Insufficient documentation

## 2016-07-05 LAB — URINALYSIS COMPLETE WITH MICROSCOPIC (ARMC ONLY)
BACTERIA UA: NONE SEEN
BILIRUBIN URINE: NEGATIVE
Glucose, UA: NEGATIVE mg/dL
Ketones, ur: NEGATIVE mg/dL
LEUKOCYTES UA: NEGATIVE
NITRITE: NEGATIVE
PH: 6 (ref 5.0–8.0)
Protein, ur: NEGATIVE mg/dL
Specific Gravity, Urine: 1.002 — ABNORMAL LOW (ref 1.005–1.030)

## 2016-07-05 LAB — WET PREP, GENITAL
Clue Cells Wet Prep HPF POC: NONE SEEN
SPERM: NONE SEEN
TRICH WET PREP: NONE SEEN
Yeast Wet Prep HPF POC: NONE SEEN

## 2016-07-05 LAB — CHLAMYDIA/NGC RT PCR (ARMC ONLY)
Chlamydia Tr: NOT DETECTED
N gonorrhoeae: NOT DETECTED

## 2016-07-05 LAB — POCT PREGNANCY, URINE: Preg Test, Ur: NEGATIVE

## 2016-07-05 MED ORDER — METRONIDAZOLE 500 MG PO TABS: 500.0000 mg | ORAL_TABLET | Freq: Two times a day (BID) | ORAL | 0 refills | Status: AC

## 2016-07-05 MED ORDER — AMOXICILLIN-POT CLAVULANATE 875-125 MG PO TABS
ORAL_TABLET | ORAL | Status: AC
  Filled 2016-07-05: qty 1

## 2016-07-05 MED ORDER — AMOXICILLIN-POT CLAVULANATE 875-125 MG PO TABS: 1.0000 | ORAL_TABLET | Freq: Two times a day (BID) | ORAL | 0 refills | Status: AC

## 2016-07-05 MED ORDER — AMOXICILLIN-POT CLAVULANATE 875-125 MG PO TABS
1.0000 | ORAL_TABLET | Freq: Once | ORAL | Status: AC
  Administered 2016-07-05: 1 via ORAL

## 2016-07-05 MED ORDER — PREDNISONE 10 MG PO TABS: 10.0000 mg | ORAL_TABLET | Freq: Every day | ORAL | 0 refills | Status: DC

## 2016-07-05 MED ORDER — IBUPROFEN 800 MG PO TABS
800.0000 mg | ORAL_TABLET | Freq: Once | ORAL | Status: AC
  Administered 2016-07-05: 800 mg via ORAL
  Filled 2016-07-05: qty 1

## 2016-07-05 MED ORDER — FLUCONAZOLE 150 MG PO TABS: 150.0000 mg | ORAL_TABLET | Freq: Once | ORAL | 0 refills | Status: AC

## 2016-07-05 NOTE — ED Notes (Signed)
Pt here for bilat earache and throat pain. Also co lower abd pain states "I might have bacterial infection".

## 2016-07-05 NOTE — ED Triage Notes (Signed)
Pt presents to ED with multiple medical comp[laints, pt reports was seen here a 1.5 months ago for vaginal discharge and itching, sore throat and bilateral ear pain. Pt reports was told she had fluid in ears, and was dx with bacterial infection. Pt states was placed on abx but states is still having itching and odor.

## 2016-07-05 NOTE — ED Notes (Signed)
Called lab asked if urine was in lab to run Ua. Stated urine was down there.

## 2016-07-05 NOTE — ED Provider Notes (Signed)
[facility]-EMERGENCY DEPARTMENT Provider Note   CSN: [id number] Arrival date & time: 07/05/16  1953     History   Chief Complaint Chief Complaint  Patient presents with  . Vaginal Itching  . Otalgia  . Sore Throat    HPI Briana Lyons is a 35 y.o. female presents to the emergency department for evaluation of severe sinus pain and pressure and drainage, ear pressure as well as vaginal itching. Patient was seen in 6 weeks ago and treated for bacterial vaginitis, clue cells were seen on wet prep. Patient states she was treated with metronidazole saw improvement for the first 3 weeks and over the last 3 weeks she has had return of the same discharge with odor and itching and discomfort. She denies any sexual intercourse with new partners. No pelvic pain. No painful urination. No fevers or lower back pain. Patient describes severe pain and pressure in her sinuses, maxillary. No difficulty swallowing. No neck pain.  HPI  History reviewed. No pertinent past medical history.  There are no active problems to display for this patient.   Past Surgical History:  Procedure Laterality Date  . APPENDECTOMY    . CESAREAN SECTION    . TUBAL LIGATION      OB History    No data available       Home Medications    Prior to Admission medications   Medication Sig Start Date End Date Taking? Authorizing Provider  amoxicillin (AMOXIL) 500 MG capsule Take 1 capsule (500 mg total) by mouth 3 (three) times daily. 01/06/16   Tommi Rumps, PA-C  amoxicillin-clavulanate (AUGMENTIN) 875-125 MG tablet Take 1 tablet by mouth every 12 (twelve) hours. X 10 days 07/05/16 07/12/16  Evon Slack, PA-C  cyclobenzaprine (FLEXERIL) 10 MG tablet Take 1 tablet (10 mg total) by mouth 3 (three) times daily as needed for muscle spasms. 09/24/15   Chinita Pester, FNP  fluconazole (DIFLUCAN) 150 MG tablet Take 1 tablet (150 mg total) by mouth once. After completing antibiotics 07/05/16 07/05/16  Evon Slack,  PA-C  ibuprofen (ADVIL,MOTRIN) 600 MG tablet Take 1 tablet (600 mg total) by mouth every 8 (eight) hours as needed. 01/06/16   Tommi Rumps, PA-C  metroNIDAZOLE (FLAGYL) 500 MG tablet Take 1 tablet (500 mg total) by mouth 2 (two) times daily. X 10 days 07/05/16 07/19/16  Evon Slack, PA-C  oxyCODONE-acetaminophen (PERCOCET) 5-325 MG per tablet Take 1 tablet by mouth every 4 (four) hours as needed for severe pain. 06/16/15   Tommi Rumps, PA-C  predniSONE (DELTASONE) 10 MG tablet Take 1 tablet (10 mg total) by mouth daily. 6,5,4,3,2,1 six day taper 07/05/16   Evon Slack, PA-C    Family History No family history on file.  Social History Social History  Substance Use Topics  . Smoking status: Current Every Day Smoker    Packs/day: 1.00  . Smokeless tobacco: Never Used  . Alcohol use No     Allergies   Codeine   Review of Systems Review of Systems  Constitutional: Negative for activity change, chills, fatigue and fever.  HENT: Positive for sinus pressure and sore throat. Negative for congestion and trouble swallowing.   Eyes: Negative for visual disturbance.  Respiratory: Negative for cough, chest tightness and shortness of breath.   Cardiovascular: Negative for chest pain and leg swelling.  Gastrointestinal: Negative for abdominal pain, diarrhea, nausea and vomiting.  Genitourinary: Positive for vaginal discharge and vaginal pain. Negative for dysuria and flank pain.  Musculoskeletal: Negative for arthralgias and gait problem.  Skin: Negative for rash.  Neurological: Negative for weakness, numbness and headaches.  Hematological: Negative for adenopathy.  Psychiatric/Behavioral: Negative for agitation, behavioral problems and confusion.     Physical Exam Updated Vital Signs BP 134/80 (BP Location: Left Arm)   Pulse (!) 106   Temp 98.4 F (36.9 C) (Oral)   Resp 18   Ht 5\' 3"  (1.6 m)   Wt 93 kg   LMP 06/21/2016   SpO2 97%   BMI 36.31 kg/m   Physical Exam    Constitutional: She is oriented to person, place, and time. She appears well-developed and well-nourished. No distress.  HENT:  Head: Normocephalic and atraumatic.  Right Ear: External ear and ear canal normal. Tympanic membrane is not injected. A middle ear effusion (Clear fluid) is present.  Left Ear: External ear and ear canal normal. Tympanic membrane is not injected. A middle ear effusion (clear fluid) is present.  Mouth/Throat: Oropharynx is clear and moist. No oropharyngeal exudate.  Patient with tenderness palpation over the maxillary sinuses  Eyes: EOM are normal. Pupils are equal, round, and reactive to light. Right eye exhibits no discharge. Left eye exhibits no discharge.  Neck: Normal range of motion. Neck supple.  Cardiovascular: Normal rate, regular rhythm and intact distal pulses.   Pulmonary/Chest: Effort normal and breath sounds normal. No respiratory distress. She exhibits no tenderness.  Abdominal: Soft. She exhibits no distension. There is no tenderness. There is no guarding.  Genitourinary: Rectal exam shows no tenderness. There is no rash, tenderness or lesion on the right labia. There is no rash, tenderness or lesion on the left labia. Cervix exhibits motion tenderness. Cervix exhibits no discharge. Right adnexum displays no mass and no tenderness. Left adnexum displays no mass and no tenderness. No erythema or bleeding in the vagina. Vaginal discharge (frothy discharge with no cervical motion tenderness. No cervical drainage) found.  Musculoskeletal: Normal range of motion. She exhibits no edema.  Lymphadenopathy:    She has no cervical adenopathy.  Neurological: She is alert and oriented to person, place, and time. She has normal reflexes.  Skin: Skin is warm and dry.  Psychiatric: She has a normal mood and affect. Her behavior is normal. Thought content normal.     ED Treatments / Results  Labs (all labs ordered are listed, but only abnormal results are  displayed) Labs Reviewed  WET PREP, GENITAL - Abnormal; Notable for the following:       Result Value   WBC, Wet Prep HPF POC FEW (*)    All other components within normal limits  URINALYSIS COMPLETEWITH MICROSCOPIC ([facility] ONLY) - Abnormal; Notable for the following:    Color, Urine COLORLESS (*)    APPearance CLEAR (*)    Specific Gravity, Urine 1.002 (*)    Hgb urine dipstick 1+ (*)    Squamous Epithelial / LPF 0-5 (*)    All other components within normal limits  CHLAMYDIA/NGC RT PCR ([facility] ONLY)  POC URINE PREG, ED  POCT PREGNANCY, URINE    EKG  EKG Interpretation None       Radiology No results found.  Procedures Procedures (including critical care time)  Medications Ordered in ED Medications  ibuprofen (ADVIL,MOTRIN) tablet 800 mg (800 mg Oral Given 07/05/16 2102)  amoxicillin-clavulanate (AUGMENTIN) 875-125 MG per tablet 1 tablet (1 tablet Oral Given 07/05/16 2102)     Initial Impression / Assessment and Plan / ED Course  I have reviewed the triage vital  signs and the nursing notes.  Pertinent labs & imaging results that were available during my care of the patient were reviewed by me and considered in my medical decision making (see chart for details).  Clinical Course    35 year old female with sinusitis and vaginal discharge. She is treated with Augmentin for sinusitis. She says symptoms for 6 weeks. Patient treated 6 weeks ago for bacterial vaginitis, responded well until 3 weeks ago symptoms return. She has the same symptoms but wet prep negative for clue cells today. We will treat with metronidazole, a 10 day course this time. She will follow-up with PCP or health department if continued symptoms. She is given a prescription for Diflucan after finishing antibiotics.  Final Clinical Impressions(s) / ED Diagnoses   Final diagnoses:  Vaginitis  Acute maxillary sinusitis, recurrence not specified    New Prescriptions New Prescriptions    AMOXICILLIN-CLAVULANATE (AUGMENTIN) 875-125 MG TABLET    Take 1 tablet by mouth every 12 (twelve) hours. X 10 days   FLUCONAZOLE (DIFLUCAN) 150 MG TABLET    Take 1 tablet (150 mg total) by mouth once. After completing antibiotics   METRONIDAZOLE (FLAGYL) 500 MG TABLET    Take 1 tablet (500 mg total) by mouth 2 (two) times daily. X 10 days   PREDNISONE (DELTASONE) 10 MG TABLET    Take 1 tablet (10 mg total) by mouth daily. 6,5,4,3,2,1 six day taper     Evon Slack, PA-C 07/05/16 2253    Phineas Semen, MD 07/07/16 [phone]

## 2016-09-03 ENCOUNTER — Encounter: Payer: Self-pay | Admitting: Emergency Medicine

## 2016-09-03 ENCOUNTER — Emergency Department
Admission: EM | Admit: 2016-09-03 | Discharge: 2016-09-03 | Disposition: A | Discharge status: Home / Self Care | Payer: Self-pay | Attending: Emergency Medicine | Admitting: Emergency Medicine

## 2016-09-03 DIAGNOSIS — Z791 Long term (current) use of non-steroidal anti-inflammatories (NSAID): Secondary | ICD-10-CM | POA: Insufficient documentation

## 2016-09-03 DIAGNOSIS — Z202 Contact with and (suspected) exposure to infections with a predominantly sexual mode of transmission: Secondary | ICD-10-CM | POA: Insufficient documentation

## 2016-09-03 DIAGNOSIS — F17210 Nicotine dependence, cigarettes, uncomplicated: Secondary | ICD-10-CM | POA: Insufficient documentation

## 2016-09-03 HISTORY — DX: Unspecified ovarian cyst, unspecified side: N83.209

## 2016-09-03 LAB — COMPREHENSIVE METABOLIC PANEL
ALK PHOS: 76 U/L (ref 38–126)
ALT: 24 U/L (ref 14–54)
ANION GAP: 7 (ref 5–15)
AST: 21 U/L (ref 15–41)
Albumin: 4.2 g/dL (ref 3.5–5.0)
BUN: 10 mg/dL (ref 6–20)
CALCIUM: 9.4 mg/dL (ref 8.9–10.3)
CO2: 26 mmol/L (ref 22–32)
Chloride: 104 mmol/L (ref 101–111)
Creatinine, Ser: 0.97 mg/dL (ref 0.44–1.00)
GFR calc non Af Amer: >60 mL/min (ref >60)
Glucose, Bld: 120 mg/dL — ABNORMAL HIGH (ref 65–99)
Potassium: 3.9 mmol/L (ref 3.5–5.1)
SODIUM: 137 mmol/L (ref 135–145)
TOTAL PROTEIN: 7.8 g/dL (ref 6.5–8.1)

## 2016-09-03 LAB — URINALYSIS COMPLETE WITH MICROSCOPIC (ARMC ONLY)
Bilirubin Urine: NEGATIVE
Glucose, UA: NEGATIVE mg/dL
Ketones, ur: NEGATIVE mg/dL
LEUKOCYTES UA: NEGATIVE
NITRITE: NEGATIVE
PROTEIN: NEGATIVE mg/dL
SPECIFIC GRAVITY, URINE: 1.011 (ref 1.005–1.030)
pH: 6 (ref 5.0–8.0)

## 2016-09-03 LAB — CBC
HCT: 36 % (ref 35.0–47.0)
HEMOGLOBIN: 11.5 g/dL — AB (ref 12.0–16.0)
MCH: 26.4 pg (ref 26.0–34.0)
MCHC: 31.9 g/dL — ABNORMAL LOW (ref 32.0–36.0)
MCV: 82.9 fL (ref 80.0–100.0)
Platelets: 400 10*3/uL (ref 150–440)
RBC: 4.34 MIL/uL (ref 3.80–5.20)
RDW: 16.4 % — ABNORMAL HIGH (ref 11.5–14.5)
WBC: 12 10*3/uL — ABNORMAL HIGH (ref 3.6–11.0)

## 2016-09-03 LAB — WET PREP, GENITAL
Clue Cells Wet Prep HPF POC: NONE SEEN
Sperm: NONE SEEN
Trich, Wet Prep: NONE SEEN
Yeast Wet Prep HPF POC: NONE SEEN

## 2016-09-03 LAB — LIPASE, BLOOD: Lipase: 23 U/L (ref 11–51)

## 2016-09-03 LAB — POCT PREGNANCY, URINE: PREG TEST UR: NEGATIVE

## 2016-09-03 MED ORDER — FLUCONAZOLE 50 MG PO TABS
150.0000 mg | ORAL_TABLET | Freq: Once | ORAL | Status: AC
  Administered 2016-09-03: 150 mg via ORAL
  Filled 2016-09-03: qty 1

## 2016-09-03 MED ORDER — CEFTRIAXONE SODIUM 250 MG IJ SOLR
250.0000 mg | Freq: Once | INTRAMUSCULAR | Status: AC
Start: 2016-09-03 — End: 2016-09-03
  Administered 2016-09-03: 250 mg via INTRAMUSCULAR
  Filled 2016-09-03: qty 250

## 2016-09-03 MED ORDER — ONDANSETRON 4 MG PO TBDP
4.0000 mg | ORAL_TABLET | Freq: Once | ORAL | Status: AC
  Administered 2016-09-03: 4 mg via ORAL
  Filled 2016-09-03: qty 1

## 2016-09-03 MED ORDER — LIDOCAINE HCL (PF) 1 % IJ SOLN
INTRAMUSCULAR | Status: AC
  Filled 2016-09-03: qty 5

## 2016-09-03 MED ORDER — AZITHROMYCIN 500 MG PO TABS
1000.0000 mg | ORAL_TABLET | Freq: Once | ORAL | Status: AC
Start: 2016-09-03 — End: 2016-09-03
  Administered 2016-09-03: 1000 mg via ORAL
  Filled 2016-09-03: qty 2

## 2016-09-03 NOTE — ED Provider Notes (Signed)
[facility] Emergency Department Provider Note   ____________________________________________    I have reviewed the triage vital signs and the nursing notes.   HISTORY  Chief Complaint Abdominal Pain; Vaginal Discharge; and Nasal Congestion     HPI Briana Lyons is a [age] y.o. female who presents with complaints of vaginal discharge and itching. Patient reports recent sexual intercourse with a partner who then told her that he had been diagnosed with chlamydia. She reports vaginal itching and whitish discharge. No fevers or chills. She complains of mild lower abdominal discomfort as well.   Past Medical History:  Diagnosis Date  . Ovarian cyst     There are no active problems to display for this patient.   Past Surgical History:  Procedure Laterality Date  . APPENDECTOMY    . CESAREAN SECTION    . TUBAL LIGATION      Prior to Admission medications   Medication Sig Start Date End Date Taking? Authorizing Provider  ibuprofen (ADVIL,MOTRIN) 600 MG tablet Take 1 tablet (600 mg total) by mouth every 8 (eight) hours as needed. 01/06/16  Yes Tommi Rumpshonda L Summers, PA-C     Allergies Codeine  History reviewed. No pertinent family history.  Social History Social History  Substance Use Topics  . Smoking status: Current Every Day Smoker    Packs/day: 0.50    Types: Cigarettes  . Smokeless tobacco: Never Used  . Alcohol use No    Review of Systems  Constitutional: No fever/chills Eyes: No visual changes.  ENT: No sore throat.  Gastrointestinal:No nausea, no vomiting.   Genitourinary: Negative for dysuria.Vaginal discharge as above Musculoskeletal: Negative for back pain. Skin: Negative for rash. Neurological: Negative for headaches  10-point ROS otherwise negative.  ____________________________________________   PHYSICAL EXAM:  VITAL SIGNS: ED Triage Vitals  Enc Vitals Group     BP 09/03/16 1924 122/79     Pulse Rate  09/03/16 1924 (!) 105     Resp 09/03/16 1924 18     Temp 09/03/16 1924 98.8 F (37.1 C)     Temp Source 09/03/16 1924 Oral     SpO2 09/03/16 1924 99 %     Weight 09/03/16 1923 207 lb (93.9 kg)     Height 09/03/16 1923 5\' 3"  (1.6 m)     Head Circumference --      Peak Flow --      Pain Score 09/03/16 1923 9     Pain Loc --      Pain Edu? --      Excl. in GC? --    Constitutional: Alert and oriented. No acute distress. Pleasant and interactive Eyes: Conjunctivae are normal.   Nose: No congestion/rhinnorhea. Mouth/Throat: Mucous membranes are moist.    Cardiovascular:  Good peripheral circulation. Respiratory: Normal respiratory effort.  No retractions.  Gastrointestinal: Soft and nontender. No distention.  No CVA tenderness. Genitourinary: thick clumpy white discharge adherent to cervix Musculoskeletal: Warm and well perfused Neurologic:  Normal speech and language. No gross focal neurologic deficits are appreciated.  Skin:  Skin is warm, dry and intact. No rash noted. Psychiatric: Mood and affect are normal. Speech and behavior are normal.  ____________________________________________   LABS (all labs ordered are listed, but only abnormal results are displayed)  Labs Reviewed  COMPREHENSIVE METABOLIC PANEL - Abnormal; Notable for the following:       Result Value   Glucose, Bld 120 (*)    Total Bilirubin <0.1 (*)    All other  components within normal limits  CBC - Abnormal; Notable for the following:    WBC 12.0 (*)    Hemoglobin 11.5 (*)    MCHC 31.9 (*)    RDW 16.4 (*)    All other components within normal limits  URINALYSIS COMPLETEWITH MICROSCOPIC ([facility] ONLY) - Abnormal; Notable for the following:    Color, Urine YELLOW (*)    APPearance CLEAR (*)    Hgb urine dipstick 1+ (*)    Bacteria, UA RARE (*)    Squamous Epithelial / LPF 0-5 (*)    All other components within normal limits  WET PREP, GENITAL  CHLAMYDIA/NGC RT PCR ([facility] ONLY)  LIPASE, BLOOD   POC URINE PREG, ED  POCT PREGNANCY, URINE   ____________________________________________  EKG  None ____________________________________________  RADIOLOGY  None ____________________________________________   PROCEDURES  Procedure(s) performed: No    Critical Care performed: No ____________________________________________   INITIAL IMPRESSION / ASSESSMENT AND PLAN / ED COURSE  Pertinent labs & imaging results that were available during my care of the patient were reviewed by me and considered in my medical decision making (see chart for details).  Patient well-appearing and in no distress. Abdominal exam is benign. We will treat for chlamydia given partner was diagnosed. No yeast on wet prep. No CMT. Ok for outpatient follow up. Return precautions discussed.  Clinical Course    ____________________________________________   FINAL CLINICAL IMPRESSION(S) / ED DIAGNOSES  Final diagnoses:  Exposure to STD      NEW MEDICATIONS STARTED DURING THIS VISIT:  New Prescriptions   No medications on file     Note:  This document was prepared using Dragon voice recognition software and may include unintentional dictation errors.    Jene Everyobert Geralyn Figiel, MD 09/03/16 [phone]

## 2016-09-03 NOTE — ED Triage Notes (Addendum)
Pt says about 3 weeks ago she had unprotected sex with an old friend; about 1 week ago she began having burning sensation in her vagina along with vaginal discharge; pain across lower abd; intermittent dysuria; also having epigastric burning the last 2 days with N/V; pt in no acute distress; presented with beverage and food; asked pt not to eat or drink until evaluated by MD and given the OK; pt adds a history of ovarian cysts and would like an US; also adds sinus congestion for 4 days; afebrile

## 2016-09-04 LAB — CHLAMYDIA/NGC RT PCR (ARMC ONLY)
Chlamydia Tr: NOT DETECTED
N GONORRHOEAE: NOT DETECTED

## 2016-09-05 ENCOUNTER — Telehealth: Payer: Self-pay | Admitting: Emergency Medicine

## 2016-09-05 NOTE — Telephone Encounter (Signed)
Patient called asking for test results.  I gave her results.  She says she continues to have vaginal discharge and upper abd pain after eating.  I told her that she can go to [facility] for follow up, but I also gave her information on [facility] and [facility] for future needs.

## 2016-09-09 ENCOUNTER — Emergency Department
Admission: EM | Admit: 2016-09-09 | Discharge: 2016-09-09 | Disposition: A | Discharge status: Home / Self Care | Payer: Self-pay | Attending: Emergency Medicine | Admitting: Emergency Medicine

## 2016-09-09 DIAGNOSIS — Z791 Long term (current) use of non-steroidal anti-inflammatories (NSAID): Secondary | ICD-10-CM | POA: Insufficient documentation

## 2016-09-09 DIAGNOSIS — N730 Acute parametritis and pelvic cellulitis: Secondary | ICD-10-CM

## 2016-09-09 DIAGNOSIS — N739 Female pelvic inflammatory disease, unspecified: Secondary | ICD-10-CM | POA: Insufficient documentation

## 2016-09-09 DIAGNOSIS — F17210 Nicotine dependence, cigarettes, uncomplicated: Secondary | ICD-10-CM | POA: Insufficient documentation

## 2016-09-09 LAB — URINALYSIS COMPLETE WITH MICROSCOPIC (ARMC ONLY)
Bilirubin Urine: NEGATIVE
GLUCOSE, UA: NEGATIVE mg/dL
Ketones, ur: NEGATIVE mg/dL
Nitrite: NEGATIVE
PROTEIN: 30 mg/dL — AB
Specific Gravity, Urine: 1.02 (ref 1.005–1.030)
pH: 5 (ref 5.0–8.0)

## 2016-09-09 LAB — POCT PREGNANCY, URINE: PREG TEST UR: NEGATIVE

## 2016-09-09 LAB — COMPREHENSIVE METABOLIC PANEL
ALBUMIN: 4.1 g/dL (ref 3.5–5.0)
ALK PHOS: 67 U/L (ref 38–126)
ALT: 19 U/L (ref 14–54)
AST: 18 U/L (ref 15–41)
Anion gap: 6 (ref 5–15)
BILIRUBIN TOTAL: 0.3 mg/dL (ref 0.3–1.2)
BUN: 9 mg/dL (ref 6–20)
CALCIUM: 8.7 mg/dL — AB (ref 8.9–10.3)
CO2: 25 mmol/L (ref 22–32)
CREATININE: 0.77 mg/dL (ref 0.44–1.00)
Chloride: 107 mmol/L (ref 101–111)
GFR calc Af Amer: >60 mL/min (ref >60)
GFR calc non Af Amer: >60 mL/min (ref >60)
GLUCOSE: 122 mg/dL — AB (ref 65–99)
Potassium: 3.5 mmol/L (ref 3.5–5.1)
Sodium: 138 mmol/L (ref 135–145)
TOTAL PROTEIN: 7.3 g/dL (ref 6.5–8.1)

## 2016-09-09 LAB — CBC
HEMATOCRIT: 33.8 % — AB (ref 35.0–47.0)
HEMOGLOBIN: 11.4 g/dL — AB (ref 12.0–16.0)
MCH: 27.3 pg (ref 26.0–34.0)
MCHC: 33.6 g/dL (ref 32.0–36.0)
MCV: 81.4 fL (ref 80.0–100.0)
Platelets: 375 10*3/uL (ref 150–440)
RBC: 4.16 MIL/uL (ref 3.80–5.20)
RDW: 16.3 % — ABNORMAL HIGH (ref 11.5–14.5)
WBC: 11.6 10*3/uL — AB (ref 3.6–11.0)

## 2016-09-09 MED ORDER — CEFTRIAXONE SODIUM 250 MG IJ SOLR
250.0000 mg | Freq: Once | INTRAMUSCULAR | Status: AC
  Administered 2016-09-09: 250 mg via INTRAMUSCULAR
  Filled 2016-09-09: qty 250

## 2016-09-09 MED ORDER — DOXYCYCLINE HYCLATE 50 MG PO CAPS: 100.0000 mg | ORAL_CAPSULE | Freq: Two times a day (BID) | ORAL | 0 refills | Status: AC

## 2016-09-09 NOTE — ED Provider Notes (Signed)
[facility] Emergency Department Provider Note  ____________________________________________  Time seen: 2:59 AM  I have reviewed the triage vital signs and the nursing notes.   HISTORY  Chief Complaint Abdominal Pain      HPI Briana Lyons is a 35 y.o. female with recent emergency department visit for possible STD exposure to chlamydia returns to the emergency department with pelvic pain and dysuria. Patient admits to a previous history of pelvic inflammatory disease and states that she had a pelvic exam performed at the health department 2 days ago and during bimanual exam experience "severe pain". Patient denies any fever no nausea no vomiting.     Past Medical History:  Diagnosis Date  . Ovarian cyst     There are no active problems to display for this patient.   Past Surgical History:  Procedure Laterality Date  . APPENDECTOMY    . CESAREAN SECTION    . TUBAL LIGATION      Current Outpatient Rx  . Order #: [id number] Class: Print  . Order #: [id number] Class: Print    Allergies Codeine  No family history on file.  Social History Social History  Substance Use Topics  . Smoking status: Current Every Day Smoker    Packs/day: 0.50    Types: Cigarettes  . Smokeless tobacco: Never Used  . Alcohol use No    Review of Systems  Constitutional: Negative for fever. Eyes: Negative for visual changes. ENT: Negative for sore throat. Cardiovascular: Negative for chest pain. Respiratory: Negative for shortness of breath. Gastrointestinal: Negative for abdominal pain, vomiting and diarrhea. Genitourinary: Positive for pelvic pain and dysuria Musculoskeletal: Negative for back pain. Skin: Negative for rash. Neurological: Negative for headaches, focal weakness or numbness.   10-point ROS otherwise negative.  ____________________________________________   PHYSICAL EXAM:  VITAL SIGNS: ED Triage Vitals  Enc Vitals Group     BP  09/09/16 0020 127/89     Pulse Rate 09/09/16 0019 90     Resp 09/09/16 0019 18     Temp 09/09/16 0019 98 F (36.7 C)     Temp Source 09/09/16 0019 Oral     SpO2 09/09/16 0019 100 %     Weight 09/09/16 0020 207 lb (93.9 kg)     Height 09/09/16 0020 5\' 3"  (1.6 m)     Head Circumference --      Peak Flow --      Pain Score 09/09/16 0021 10     Pain Loc --      Pain Edu? --      Excl. in GC? --      Constitutional: Alert and oriented. Well appearing and in no distress. Eyes: Conjunctivae are normal. PERRL. Normal extraocular movements. ENT   Head: Normocephalic and atraumatic.   Nose: No congestion/rhinnorhea.   Mouth/Throat: Mucous membranes are moist.   Neck: No stridor. Hematological/Lymphatic/Immunilogical: No cervical lymphadenopathy. Cardiovascular: Normal rate, regular rhythm. Normal and symmetric distal pulses are present in all extremities. No murmurs, rubs, or gallops. Respiratory: Normal respiratory effort without tachypnea nor retractions. Breath sounds are clear and equal bilaterally. No wheezes/rales/rhonchi. Gastrointestinal: Suprapubic discomfort with palpation No distention. There is no CVA tenderness. Genitourinary: deferred Musculoskeletal: Nontender with normal range of motion in all extremities. No joint effusions.  No lower extremity tenderness nor edema. Neurologic:  Normal speech and language. No gross focal neurologic deficits are appreciated. Speech is normal.  Skin:  Skin is warm, dry and intact. No rash noted. Psychiatric: Mood and affect are normal.  Speech and behavior are normal. Patient exhibits appropriate insight and judgment.  ____________________________________________    LABS (pertinent positives/negatives)  Labs Reviewed  CBC - Abnormal; Notable for the following:       Result Value   WBC 11.6 (*)    Hemoglobin 11.4 (*)    HCT 33.8 (*)    RDW 16.3 (*)    All other components within normal limits  COMPREHENSIVE METABOLIC  PANEL - Abnormal; Notable for the following:    Glucose, Bld 122 (*)    Calcium 8.7 (*)    All other components within normal limits  URINALYSIS COMPLETEWITH MICROSCOPIC ([facility] ONLY) - Abnormal; Notable for the following:    Color, Urine YELLOW (*)    APPearance CLOUDY (*)    Hgb urine dipstick 1+ (*)    Protein, ur 30 (*)    Leukocytes, UA 1+ (*)    Bacteria, UA RARE (*)    Squamous Epithelial / LPF TOO NUMEROUS TO COUNT (*)    All other components within normal limits  POCT PREGNANCY, URINE        Procedures    INITIAL IMPRESSION / ASSESSMENT AND PLAN / ED COURSE  Pertinent labs & imaging results that were available during my care of the patient were reviewed by me and considered in my medical decision making (see chart for details).  History of physical exam concerning for possible PID as such patient received 250 mg IM ceftriaxone and doxycycline prescribed 14 days twice a day  ____________________________________________   FINAL CLINICAL IMPRESSION(S) / ED DIAGNOSES  Final diagnoses:  PID (acute pelvic inflammatory disease)      Darci Currentandolph N Brown, MD 09/09/16 [phone]

## 2016-09-09 NOTE — ED Triage Notes (Signed)
Pt in with co abd pain x 1 week states to supra pubic pain states has seen several doctors for the same including this ED and was told all tests were wnl.

## 2017-03-22 ENCOUNTER — Encounter: Payer: Self-pay | Admitting: Emergency Medicine

## 2017-03-22 ENCOUNTER — Emergency Department
Admission: EM | Admit: 2017-03-22 | Discharge: 2017-03-22 | Disposition: A | Discharge status: Home / Self Care | Payer: Self-pay | Attending: Emergency Medicine | Admitting: Emergency Medicine

## 2017-03-22 DIAGNOSIS — R102 Pelvic and perineal pain: Secondary | ICD-10-CM | POA: Insufficient documentation

## 2017-03-22 DIAGNOSIS — A6004 Herpesviral vulvovaginitis: Secondary | ICD-10-CM

## 2017-03-22 DIAGNOSIS — F17210 Nicotine dependence, cigarettes, uncomplicated: Secondary | ICD-10-CM | POA: Insufficient documentation

## 2017-03-22 HISTORY — DX: Herpesviral infection, unspecified: B00.9

## 2017-03-22 LAB — URINALYSIS, COMPLETE (UACMP) WITH MICROSCOPIC
BACTERIA UA: NONE SEEN
BILIRUBIN URINE: NEGATIVE
GLUCOSE, UA: NEGATIVE mg/dL
HGB URINE DIPSTICK: NEGATIVE
Ketones, ur: NEGATIVE mg/dL
NITRITE: NEGATIVE
Protein, ur: NEGATIVE mg/dL
SPECIFIC GRAVITY, URINE: 1.017 (ref 1.005–1.030)
pH: 5 (ref 5.0–8.0)

## 2017-03-22 LAB — CHLAMYDIA/NGC RT PCR (ARMC ONLY)
CHLAMYDIA TR: NOT DETECTED
N GONORRHOEAE: NOT DETECTED

## 2017-03-22 LAB — WET PREP, GENITAL
Clue Cells Wet Prep HPF POC: NONE SEEN
Sperm: NONE SEEN
Trich, Wet Prep: NONE SEEN
Yeast Wet Prep HPF POC: NONE SEEN

## 2017-03-22 LAB — POCT PREGNANCY, URINE: PREG TEST UR: NEGATIVE

## 2017-03-22 MED ORDER — AZITHROMYCIN 500 MG PO TABS
1000.0000 mg | ORAL_TABLET | Freq: Once | ORAL | Status: AC
  Administered 2017-03-22: 1000 mg via ORAL
  Filled 2017-03-22: qty 2

## 2017-03-22 MED ORDER — CEFTRIAXONE SODIUM 250 MG IJ SOLR
250.0000 mg | INTRAMUSCULAR | Status: DC
  Administered 2017-03-22: 250 mg via INTRAMUSCULAR
  Filled 2017-03-22: qty 250

## 2017-03-22 MED ORDER — VALACYCLOVIR HCL 500 MG PO TABS: 500.0000 mg | ORAL_TABLET | Freq: Two times a day (BID) | ORAL | 0 refills | Status: AC
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
## 2017-03-23 LAB — URINE CULTURE

## 2017-04-14 ENCOUNTER — Emergency Department
Admission: EM | Admit: 2017-04-14 | Discharge: 2017-04-14 | Disposition: A | Discharge status: Home / Self Care | Payer: Self-pay | Attending: Emergency Medicine | Admitting: Emergency Medicine

## 2017-04-14 ENCOUNTER — Encounter: Payer: Self-pay | Admitting: Medical Oncology

## 2017-04-14 DIAGNOSIS — F17210 Nicotine dependence, cigarettes, uncomplicated: Secondary | ICD-10-CM | POA: Insufficient documentation

## 2017-04-14 DIAGNOSIS — B349 Viral infection, unspecified: Secondary | ICD-10-CM

## 2017-04-14 DIAGNOSIS — A6000 Herpesviral infection of urogenital system, unspecified: Secondary | ICD-10-CM | POA: Insufficient documentation

## 2017-04-14 DIAGNOSIS — Z79899 Other long term (current) drug therapy: Secondary | ICD-10-CM | POA: Insufficient documentation

## 2017-04-14 DIAGNOSIS — N390 Urinary tract infection, site not specified: Secondary | ICD-10-CM | POA: Insufficient documentation

## 2017-04-14 DIAGNOSIS — J029 Acute pharyngitis, unspecified: Secondary | ICD-10-CM

## 2017-04-14 LAB — URINALYSIS, ROUTINE W REFLEX MICROSCOPIC
BACTERIA UA: NONE SEEN
Bilirubin Urine: NEGATIVE
Glucose, UA: NEGATIVE mg/dL
KETONES UR: NEGATIVE mg/dL
NITRITE: NEGATIVE
PROTEIN: NEGATIVE mg/dL
Specific Gravity, Urine: 1.014 (ref 1.005–1.030)
pH: 5 (ref 5.0–8.0)

## 2017-04-14 LAB — PREGNANCY, URINE: PREG TEST UR: NEGATIVE

## 2017-04-14 MED ORDER — MAGIC MOUTHWASH W/LIDOCAINE: 5.0000 mL | Freq: Four times a day (QID) | ORAL | 0 refills | Status: DC

## 2017-04-14 MED ORDER — SULFAMETHOXAZOLE-TRIMETHOPRIM 800-160 MG PO TABS: 1.0000 | ORAL_TABLET | Freq: Two times a day (BID) | ORAL | 0 refills | Status: DC

## 2017-04-14 MED ORDER — ACYCLOVIR 400 MG PO TABS: 400.0000 mg | ORAL_TABLET | Freq: Two times a day (BID) | ORAL | 0 refills | Status: AC

## 2017-04-14 MED ORDER — PSEUDOEPH-BROMPHEN-DM 30-2-10 MG/5ML PO SYRP: 5.0000 mL | ORAL_SOLUTION | Freq: Four times a day (QID) | ORAL | 0 refills | Status: DC | PRN

## 2017-04-14 MED ORDER — DIPHENHYDRAMINE HCL 12.5 MG/5ML PO ELIX: 25.0000 mg | ORAL_SOLUTION | Freq: Once | ORAL | Status: DC

## 2017-04-14 MED ORDER — TRAMADOL HCL 50 MG PO TABS: 50.0000 mg | ORAL_TABLET | Freq: Four times a day (QID) | ORAL | 0 refills | Status: DC | PRN

## 2017-04-14 MED ORDER — PHENAZOPYRIDINE HCL 200 MG PO TABS: 200.0000 mg | ORAL_TABLET | Freq: Three times a day (TID) | ORAL | 0 refills | Status: DC | PRN

## 2017-04-14 MED ORDER — VALACYCLOVIR HCL 500 MG PO TABS: 500.0000 mg | ORAL_TABLET | Freq: Three times a day (TID) | ORAL | 0 refills | Status: AC

## 2017-04-14 MED ORDER — FLUCONAZOLE 150 MG PO TABS
150.0000 mg | ORAL_TABLET | Freq: Every day | ORAL | 0 refills | Status: DC
Start: 2017-04-14 — End: 2017-09-25

## 2017-04-14 MED ORDER — LIDOCAINE VISCOUS 2 % MT SOLN: 15.0000 mL | Freq: Once | OROMUCOSAL | Status: DC

## 2017-04-14 NOTE — ED Triage Notes (Signed)
Pt reports that she has herpes and has been taking meds as prescribed and reports that she continues to have outbreak. Also reports that she has some white vaginal discharge.

## 2017-04-14 NOTE — ED Provider Notes (Signed)
[facility] Emergency Department Provider Note   ____________________________________________   First MD Initiated Contact with Patient 04/14/17 [phone]     (approximate)  I have reviewed the triage vital signs and the nursing notes.   HISTORY  Chief Complaint vaginal pain    HPI Briana Lyons is a 36 y.o. female patient complaining of vaginal herpes outbreak, vaginal discharge, and dysuria. Patient states she is on suppressive therapy of acyclovir 400 mg twice a day, and Valtrex 500 mg 3 times a day outbreak. Patient states she is out of both medications. Patient denies fever or flank pain.Patient is rating her pain as 8/10.   Past Medical History:  Diagnosis Date  . Herpes   . Ovarian cyst     There are no active problems to display for this patient.   Past Surgical History:  Procedure Laterality Date  . APPENDECTOMY    . CESAREAN SECTION    . TUBAL LIGATION      Prior to Admission medications   Medication Sig Start Date End Date Taking? Authorizing Provider  acyclovir (ZOVIRAX) 400 MG tablet  02/28/17   [provider]  acyclovir (ZOVIRAX) 400 MG tablet Take 1 tablet (400 mg total) by mouth 2 (two) times daily. 04/14/17 04/24/17  Joni Reining, PA-C  brompheniramine-pseudoephedrine-DM 30-2-10 MG/5ML syrup Take 5 mLs by mouth 4 (four) times daily as needed. 04/14/17   Joni Reining, PA-C  cyanocobalamin 1000 MCG tablet Take 1,000 mcg by mouth daily.    [provider]  fluconazole (DIFLUCAN) 150 MG tablet Take 1 tablet (150 mg total) by mouth daily. 04/14/17   Joni Reining, PA-C  ibuprofen (ADVIL,MOTRIN) 600 MG tablet Take 1 tablet (600 mg total) by mouth every 8 (eight) hours as needed. 01/06/16   Tommi Rumps, PA-C  magic mouthwash w/lidocaine SOLN Take 5 mLs by mouth 4 (four) times daily. 04/14/17   Joni Reining, PA-C  phenazopyridine (PYRIDIUM) 200 MG tablet Take 1 tablet (200 mg total) by mouth 3 (three) times  daily as needed for pain. 04/14/17   Joni Reining, PA-C  sulfamethoxazole-trimethoprim (BACTRIM DS,SEPTRA DS) 800-160 MG tablet Take 1 tablet by mouth 2 (two) times daily. 04/14/17   Joni Reining, PA-C  traMADol (ULTRAM) 50 MG tablet Take 1 tablet (50 mg total) by mouth every 6 (six) hours as needed for moderate pain. 04/14/17   Joni Reining, PA-C  valACYclovir (VALTREX) 500 MG tablet Take 1 tablet (500 mg total) by mouth 3 (three) times daily. 04/14/17 04/21/17  Joni Reining, PA-C    Allergies Codeine  No family history on file.  Social History Social History  Substance Use Topics  . Smoking status: Current Every Day Smoker    Packs/day: 0.50    Types: Cigarettes  . Smokeless tobacco: Never Used  . Alcohol use No    Review of Systems  Constitutional: No fever/chills Eyes: No visual changes. ENT: No sore throat. Cardiovascular: Denies chest pain. Respiratory: Denies shortness of breath. Gastrointestinal: No abdominal pain.  No nausea, no vomiting.  No diarrhea.  No constipation. Genitourinary: Positive for dysuria, vaginal discharge, and vaginal lesions.  Musculoskeletal: Negative for back pain. Skin: Negative for rash. Neurological: Negative for headaches, focal weakness or numbness. Allergic/Immunilogical: Codeine ____________________________________________   PHYSICAL EXAM:  VITAL SIGNS: ED Triage Vitals  Enc Vitals Group     BP 04/14/17 0847 126/81     Pulse Rate 04/14/17 0847 95     Resp  04/14/17 0847 18     Temp 04/14/17 0847 98.6 F (37 C)     Temp Source 04/14/17 0847 Oral     SpO2 04/14/17 0847 98 %     Weight 04/14/17 0847 195 lb (88.5 kg)     Height 04/14/17 0847 5\' 3"  (1.6 m)     Head Circumference --      Peak Flow --      Pain Score 04/14/17 0846 8     Pain Loc --      Pain Edu? --      Excl. in GC? --     Constitutional: Alert and oriented. Well appearing and in no acute distress. Cardiovascular: Normal rate, regular rhythm. Grossly  normal heart sounds.  Good peripheral circulation. Respiratory: Normal respiratory effort.  No retractions. Lungs CTAB. Genitourinary: Vesicle lesions right labia and whitest vaginal discharge. Musculoskeletal: No lower extremity tenderness nor edema.  No joint effusions. Neurologic:  Normal speech and language. No gross focal neurologic deficits are appreciated. No gait instability. Skin:  Skin is warm, dry and intact. No rash noted. Psychiatric: Mood and affect are normal. Speech and behavior are normal.  ____________________________________________   LABS (all labs ordered are listed, but only abnormal results are displayed)  Labs Reviewed  URINALYSIS, ROUTINE W REFLEX MICROSCOPIC - Abnormal; Notable for the following:       Result Value   Color, Urine YELLOW (*)    APPearance CLOUDY (*)    Hgb urine dipstick SMALL (*)    Leukocytes, UA TRACE (*)    Squamous Epithelial / LPF 6-30 (*)    All other components within normal limits  PREGNANCY, URINE   ____________________________________________  EKG   ____________________________________________  RADIOLOGY  No results found.  ____________________________________________   PROCEDURES  Procedure(s) performed: None  Procedures  Critical Care performed: No  ____________________________________________   INITIAL IMPRESSION / ASSESSMENT AND PLAN / ED COURSE  Pertinent labs & imaging results that were available during my care of the patient were reviewed by me and considered in my medical decision making (see chart for details).  Genital herpes  and urinary tract infection. Patient given discharge care instructions and advised follow-up with PCP. Patient given a work note for 2 days.      ____________________________________________   FINAL CLINICAL IMPRESSION(S) / ED DIAGNOSES  Final diagnoses:  Pharyngitis with viral syndrome      NEW MEDICATIONS STARTED DURING THIS VISIT:  New Prescriptions    ACYCLOVIR (ZOVIRAX) 400 MG TABLET    Take 1 tablet (400 mg total) by mouth 2 (two) times daily.   BROMPHENIRAMINE-PSEUDOEPHEDRINE-DM 30-2-10 MG/5ML SYRUP    Take 5 mLs by mouth 4 (four) times daily as needed.   FLUCONAZOLE (DIFLUCAN) 150 MG TABLET    Take 1 tablet (150 mg total) by mouth daily.   MAGIC MOUTHWASH W/LIDOCAINE SOLN    Take 5 mLs by mouth 4 (four) times daily.   PHENAZOPYRIDINE (PYRIDIUM) 200 MG TABLET    Take 1 tablet (200 mg total) by mouth 3 (three) times daily as needed for pain.   SULFAMETHOXAZOLE-TRIMETHOPRIM (BACTRIM DS,SEPTRA DS) 800-160 MG TABLET    Take 1 tablet by mouth 2 (two) times daily.   TRAMADOL (ULTRAM) 50 MG TABLET    Take 1 tablet (50 mg total) by mouth every 6 (six) hours as needed for moderate pain.   VALACYCLOVIR (VALTREX) 500 MG TABLET    Take 1 tablet (500 mg total) by mouth 3 (three) times daily.  Note:  This document was prepared using Dragon voice recognition software and may include unintentional dictation errors.    Joni ReiningSmith, Jimesha Rising K, PA-C 04/14/17 [id number]    Sharyn CreamerQuale, Mark, MD 04/14/17 1710

## 2017-06-16 ENCOUNTER — Emergency Department: Payer: Self-pay

## 2017-06-16 ENCOUNTER — Emergency Department
Admission: EM | Admit: 2017-06-16 | Discharge: 2017-06-16 | Disposition: A | Discharge status: Home / Self Care | Payer: Self-pay | Attending: Emergency Medicine | Admitting: Emergency Medicine

## 2017-06-16 ENCOUNTER — Encounter: Payer: Self-pay | Admitting: *Deleted

## 2017-06-16 DIAGNOSIS — R059 Cough, unspecified: Secondary | ICD-10-CM

## 2017-06-16 DIAGNOSIS — R05 Cough: Secondary | ICD-10-CM | POA: Insufficient documentation

## 2017-06-16 DIAGNOSIS — Z79899 Other long term (current) drug therapy: Secondary | ICD-10-CM | POA: Insufficient documentation

## 2017-06-16 DIAGNOSIS — F17210 Nicotine dependence, cigarettes, uncomplicated: Secondary | ICD-10-CM | POA: Insufficient documentation

## 2017-06-16 DIAGNOSIS — N3000 Acute cystitis without hematuria: Secondary | ICD-10-CM | POA: Insufficient documentation

## 2017-06-16 LAB — URINALYSIS, COMPLETE (UACMP) WITH MICROSCOPIC
Bacteria, UA: NONE SEEN
Specific Gravity, Urine: 1.019 (ref 1.005–1.030)

## 2017-06-16 LAB — POCT PREGNANCY, URINE: Preg Test, Ur: NEGATIVE

## 2017-06-16 MED ORDER — NITROFURANTOIN MONOHYD MACRO 100 MG PO CAPS
100.0000 mg | ORAL_CAPSULE | Freq: Once | ORAL | Status: AC
  Administered 2017-06-16: 100 mg via ORAL
  Filled 2017-06-16: qty 1

## 2017-06-16 MED ORDER — NITROFURANTOIN MONOHYD MACRO 100 MG PO CAPS: 100.0000 mg | ORAL_CAPSULE | Freq: Two times a day (BID) | ORAL | 0 refills | Status: AC

## 2017-06-16 MED ORDER — NICOTINE POLACRILEX 2 MG MT GUM: 2.0000 mg | CHEWING_GUM | OROMUCOSAL | 0 refills | Status: DC | PRN

## 2017-06-16 NOTE — ED Triage Notes (Signed)
Pt complains of dysuria, pt is currently being treated for a UTI, pt complains of lower abdominal/pelvic pain

## 2017-06-16 NOTE — ED Notes (Signed)
E-sign pad is not working. Patient verbalized understanding of RX and d/c instructions and denies any questions at this time.

## 2017-06-16 NOTE — ED Provider Notes (Signed)
[facility] Emergency Department Provider Note  ____________________________________________  Time seen: Approximately 8:59 AM  I have reviewed the triage vital signs and the nursing notes.   HISTORY  Chief Complaint Dysuria   HPI Briana Lyons is a 36 y.o. female no significant past medical history who presents for evaluation of dysuria.patient reports 2 days of dysuria and frequency . She is also complaining of mild sharp suprapubicabdominal pain worse with urination since yesterday. No flank pain, no chills, fever, nausea, or vomiting. Patient has been taking OTC meds for UTI, no abx. No vaginal discharge. Patient is also complaining of a dry cough she has had for several months. She is really upset that she has seen several proiders for this cough and no XR has been done. No fever, or chills, no sob, or CP. She is a smoker.    Past Medical History:  Diagnosis Date  . Herpes   . Ovarian cyst     There are no active problems to display for this patient.   Past Surgical History:  Procedure Laterality Date  . APPENDECTOMY    . CESAREAN SECTION    . TUBAL LIGATION      Prior to Admission medications   Medication Sig Start Date End Date Taking? Authorizing Provider  acyclovir (ZOVIRAX) 400 MG tablet  02/28/17   [provider]  brompheniramine-pseudoephedrine-DM 30-2-10 MG/5ML syrup Take 5 mLs by mouth 4 (four) times daily as needed. 04/14/17   Joni Reining, PA-C  cyanocobalamin 1000 MCG tablet Take 1,000 mcg by mouth daily.    [provider]  fluconazole (DIFLUCAN) 150 MG tablet Take 1 tablet (150 mg total) by mouth daily. 04/14/17   Joni Reining, PA-C  ibuprofen (ADVIL,MOTRIN) 600 MG tablet Take 1 tablet (600 mg total) by mouth every 8 (eight) hours as needed. 01/06/16   Tommi Rumps, PA-C  magic mouthwash w/lidocaine SOLN Take 5 mLs by mouth 4 (four) times daily. 04/14/17   Joni Reining, PA-C  nicotine polacrilex  (NICORETTE) 2 MG gum Take 1 each (2 mg total) by mouth as needed for smoking cessation. 06/16/17   Nita Sickle, MD  nitrofurantoin, macrocrystal-monohydrate, (MACROBID) 100 MG capsule Take 1 capsule (100 mg total) by mouth 2 (two) times daily. 06/16/17 06/21/17  Nita Sickle, MD  phenazopyridine (PYRIDIUM) 200 MG tablet Take 1 tablet (200 mg total) by mouth 3 (three) times daily as needed for pain. 04/14/17   Joni Reining, PA-C  sulfamethoxazole-trimethoprim (BACTRIM DS,SEPTRA DS) 800-160 MG tablet Take 1 tablet by mouth 2 (two) times daily. 04/14/17   Joni Reining, PA-C  traMADol (ULTRAM) 50 MG tablet Take 1 tablet (50 mg total) by mouth every 6 (six) hours as needed for moderate pain. 04/14/17   Joni Reining, PA-C    Allergies Codeine  No family history on file.  Social History Social History  Substance Use Topics  . Smoking status: Current Every Day Smoker    Packs/day: 0.50    Types: Cigarettes  . Smokeless tobacco: Never Used  . Alcohol use No    Review of Systems  Constitutional: Negative for fever. Eyes: Negative for visual changes. ENT: Negative for sore throat. Neck: No neck pain  Cardiovascular: Negative for chest pain. Respiratory: Negative for shortness of breath. + cough Gastrointestinal: + suprapubic abdominal pain. No vomiting or diarrhea. Genitourinary: + dysuria. Musculoskeletal: Negative for back pain. Skin: Negative for rash. Neurological: Negative for headaches, weakness or numbness. Psych: No SI or  HI  ____________________________________________   PHYSICAL EXAM:  VITAL SIGNS: ED Triage Vitals  Enc Vitals Group     BP 06/16/17 0756 (!) 105/53     Pulse Rate 06/16/17 0756 94     Resp 06/16/17 0756 20     Temp 06/16/17 0756 98.4 F (36.9 C)     Temp Source 06/16/17 0756 Oral     SpO2 06/16/17 0756 100 %     Weight 06/16/17 0757 180 lb (81.6 kg)     Height 06/16/17 0757 5\' 3"  (1.6 m)     Head Circumference --      Peak Flow --        Pain Score 06/16/17 0756 9     Pain Loc --      Pain Edu? --      Excl. in GC? --     Constitutional: Alert and oriented. Well appearing and in no apparent distress. HEENT:      Head: Normocephalic and atraumatic.         Eyes: Conjunctivae are normal. Sclera is non-icteric.       Mouth/Throat: Mucous membranes are moist.       Neck: Supple with no signs of meningismus. Cardiovascular: Regular rate and rhythm. No murmurs, gallops, or rubs. 2+ symmetrical distal pulses are present in all extremities. No JVD. Respiratory: Normal respiratory effort. Lungs are clear to auscultation bilaterally. No wheezes, crackles, or rhonchi.  Gastrointestinal: Soft, mild suprapubic ttp, no RLQ or LLQ tt, and non distended with positive bowel sounds. No rebound or guarding. Genitourinary: No CVA tenderness. Musculoskeletal: Nontender with normal range of motion in all extremities. No edema, cyanosis, or erythema of extremities. Neurologic: Normal speech and language. Face is symmetric. Moving all extremities. No gross focal neurologic deficits are appreciated. Skin: Skin is warm, dry and intact. No rash noted. Psychiatric: Mood and affect are normal. Speech and behavior are normal.  ____________________________________________   LABS (all labs ordered are listed, but only abnormal results are displayed)  Labs Reviewed  URINALYSIS, COMPLETE (UACMP) WITH MICROSCOPIC - Abnormal; Notable for the following:       Result Value   Color, Urine ORANGE (*)    Glucose, UA   (*)    Value: TEST NOT REPORTED DUE TO COLOR INTERFERENCE OF URINE PIGMENT   Hgb urine dipstick   (*)    Value: TEST NOT REPORTED DUE TO COLOR INTERFERENCE OF URINE PIGMENT   Bilirubin Urine   (*)    Value: TEST NOT REPORTED DUE TO COLOR INTERFERENCE OF URINE PIGMENT   Ketones, ur   (*)    Value: TEST NOT REPORTED DUE TO COLOR INTERFERENCE OF URINE PIGMENT   Protein, ur   (*)    Value: TEST NOT REPORTED DUE TO COLOR INTERFERENCE OF  URINE PIGMENT   Nitrite   (*)    Value: TEST NOT REPORTED DUE TO COLOR INTERFERENCE OF URINE PIGMENT   Leukocytes, UA   (*)    Value: TEST NOT REPORTED DUE TO COLOR INTERFERENCE OF URINE PIGMENT   Squamous Epithelial / LPF TOO NUMEROUS TO COUNT (*)    All other components within normal limits  URINE CULTURE  POCT PREGNANCY, URINE   ____________________________________________  EKG  none ____________________________________________  RADIOLOGY  CXR: Negative  ____________________________________________   PROCEDURES  Procedure(s) performed: None Procedures Critical Care performed:  None ____________________________________________   INITIAL IMPRESSION / ASSESSMENT AND PLAN / ED COURSE  36 y.o. female no significant past medical history who presents for  evaluation of dysuria x 2 days. No signs or symptoms of systemic infection with no fever, chills, flank pain, nausea, vomiting, normal vital signs. We'll check urinalysis and a pregnancy test. Patient has had this cough for several months with no chills fever or chest pain or shortness of breath. I explained to her this is most likely due to her smoking and counseled her on stop smoking. She has requested a prescription for nicotine gum which I will provide her. Her chest x-ray is negative.     _________________________ 9:31 AM on 06/16/2017 -----------------------------------------  Urinalysis inconclusive due to color interference from over-the-counter medication the patient has been taking. Therefore since patient is symptomatic I will start her on Macrobid. Urine culture is pending.  Pertinent labs & imaging results that were available during my care of the patient were reviewed by me and considered in my medical decision making (see chart for details).    ____________________________________________   FINAL CLINICAL IMPRESSION(S) / ED DIAGNOSES  Final diagnoses:  Acute cystitis without hematuria  Cough       NEW MEDICATIONS STARTED DURING THIS VISIT:  New Prescriptions   NICOTINE POLACRILEX (NICORETTE) 2 MG GUM    Take 1 each (2 mg total) by mouth as needed for smoking cessation.   NITROFURANTOIN, MACROCRYSTAL-MONOHYDRATE, (MACROBID) 100 MG CAPSULE    Take 1 capsule (100 mg total) by mouth 2 (two) times daily.     Note:  This document was prepared using Dragon voice recognition software and may include unintentional dictation errors.    Don Perking, [place], MD 06/16/17 [phone]

## 2017-06-16 NOTE — Discharge Instructions (Signed)

## 2017-06-17 LAB — URINE CULTURE

## 2017-09-25 ENCOUNTER — Encounter: Payer: Self-pay | Admitting: Emergency Medicine

## 2017-09-25 ENCOUNTER — Emergency Department
Admission: EM | Admit: 2017-09-25 | Discharge: 2017-09-25 | Disposition: A | Discharge status: Home / Self Care | Payer: Self-pay | Attending: Emergency Medicine | Admitting: Emergency Medicine

## 2017-09-25 ENCOUNTER — Other Ambulatory Visit: Payer: Self-pay

## 2017-09-25 DIAGNOSIS — F17210 Nicotine dependence, cigarettes, uncomplicated: Secondary | ICD-10-CM | POA: Insufficient documentation

## 2017-09-25 DIAGNOSIS — B9689 Other specified bacterial agents as the cause of diseases classified elsewhere: Secondary | ICD-10-CM | POA: Insufficient documentation

## 2017-09-25 DIAGNOSIS — N760 Acute vaginitis: Secondary | ICD-10-CM | POA: Insufficient documentation

## 2017-09-25 DIAGNOSIS — Z79899 Other long term (current) drug therapy: Secondary | ICD-10-CM | POA: Insufficient documentation

## 2017-09-25 LAB — CHLAMYDIA/NGC RT PCR (ARMC ONLY)
Chlamydia Tr: NOT DETECTED
N GONORRHOEAE: NOT DETECTED

## 2017-09-25 LAB — WET PREP, GENITAL
SPERM: NONE SEEN
Trich, Wet Prep: NONE SEEN
Yeast Wet Prep HPF POC: NONE SEEN

## 2017-09-25 LAB — POCT PREGNANCY, URINE: PREG TEST UR: NEGATIVE

## 2017-09-25 MED ORDER — METRONIDAZOLE 500 MG PO TABS: 500.0000 mg | ORAL_TABLET | Freq: Two times a day (BID) | ORAL | 0 refills | Status: DC

## 2017-09-25 MED ORDER — FLUCONAZOLE 150 MG PO TABS: 150.0000 mg | ORAL_TABLET | Freq: Every day | ORAL | 0 refills | Status: DC

## 2017-09-25 NOTE — Discharge Instructions (Signed)
[facility] if any continued problems. Begin taking antibiotics as directed. Also Diflucan should you develop a yeast infection on this antibiotic.

## 2017-09-25 NOTE — ED Triage Notes (Signed)
Pt to ed with c/o vaginal itching and yellow discharge several times per day, for 2 weeks.  States she took OTC yeast infection medication without relief.  Denies urinary issues.

## 2017-09-25 NOTE — ED Provider Notes (Signed)
[facility] Emergency Department Provider Note  ____________________________________________   First MD Initiated Contact with Patient 09/25/17 1246     (approximate)  I have reviewed the triage vital signs and the nursing notes.   HISTORY  Chief Complaint Vaginal Itching and Vaginal Discharge   HPI Briana Lyons is a 36 y.o. female is here complaining of vaginal discharge and itching for several days. She's also had some itching for 2 weeks prior to discharge. Patient states that she used some over-the-counter yeast medication without any relief. She denies any urinary issues. Patient is concerned about a herpes outbreak.   Past Medical History:  Diagnosis Date  . Herpes   . Ovarian cyst     There are no active problems to display for this patient.   Past Surgical History:  Procedure Laterality Date  . APPENDECTOMY    . CESAREAN SECTION    . TUBAL LIGATION      Prior to Admission medications   Medication Sig Start Date End Date Taking? Authorizing Provider  cyanocobalamin 1000 MCG tablet Take 1,000 mcg by mouth daily.    [provider]  fluconazole (DIFLUCAN) 150 MG tablet Take 1 tablet (150 mg total) by mouth daily. 09/25/17   Tommi RumpsSummers, Atul Delucia L, PA-C  ibuprofen (ADVIL,MOTRIN) 600 MG tablet Take 1 tablet (600 mg total) by mouth every 8 (eight) hours as needed. 01/06/16   Tommi RumpsSummers, Bodey Frizell L, PA-C  metroNIDAZOLE (FLAGYL) 500 MG tablet Take 1 tablet (500 mg total) by mouth 2 (two) times daily. 09/25/17   Tommi RumpsSummers, Ledora Delker L, PA-C    Allergies Codeine  History reviewed. No pertinent family history.  Social History Social History   Tobacco Use  . Smoking status: Current Every Day Smoker    Packs/day: 0.50    Types: Cigarettes  . Smokeless tobacco: Never Used  Substance Use Topics  . Alcohol use: No  . Drug use: No    Review of Systems Constitutional: No fever/chills Cardiovascular: Denies chest pain. Respiratory: Denies  shortness of breath. Gastrointestinal: No abdominal pain.  No nausea, no vomiting. Genitourinary: Negative for dysuria. Positive vaginal discharge and itching. Skin: Negative for rash. Neurological: Negative for headaches, focal weakness or numbness. ___________________________________________   PHYSICAL EXAM:  VITAL SIGNS: ED Triage Vitals  Enc Vitals Group     BP 09/25/17 1211 126/83     Pulse Rate 09/25/17 1211 83     Resp 09/25/17 1211 18     Temp 09/25/17 1211 98.3 F (36.8 C)     Temp Source 09/25/17 1211 Oral     SpO2 09/25/17 1211 100 %     Weight 09/25/17 1211 185 lb (83.9 kg)     Height 09/25/17 1211 5\' 3"  (1.6 m)     Head Circumference --      Peak Flow --      Pain Score 09/25/17 1224 6     Pain Loc --      Pain Edu? --      Excl. in GC? --    Constitutional: Alert and oriented. Well appearing and in no acute distress. Eyes: Conjunctivae are normal.  Head: Atraumatic. Neck: No stridor.   Cardiovascular: Normal rate, regular rhythm. Grossly normal heart sounds.  Good peripheral circulation. Respiratory: Normal respiratory effort.  No retractions. Lungs CTAB. Gastrointestinal: Soft and nontender. No distention.  No CVA tenderness. Urogenital:  No rash or vesicles noted external genitalia. There is white and a few day present along the vaginal walls.There is no  adnexal masses or tenderness noted. Swabs were obtained. Musculoskeletal: No lower extremity tenderness nor edema.  No joint effusions. Neurologic:  Normal speech and language. No gross focal neurologic deficits are appreciated.  Skin:  Skin is warm, dry and intact. No rash noted. Psychiatric: Mood and affect are normal. Speech and behavior are normal.  ____________________________________________   LABS (all labs ordered are listed, but only abnormal results are displayed)  Labs Reviewed  WET PREP, GENITAL - Abnormal; Notable for the following components:      Result Value   Clue Cells Wet Prep HPF  POC PRESENT (*)    WBC, Wet Prep HPF POC RARE (*)    All other components within normal limits  CHLAMYDIA/NGC RT PCR ([facility] ONLY)  POCT PREGNANCY, URINE    PROCEDURES  Procedure(s) performed: None  Procedures  Critical Care performed: No  ____________________________________________   INITIAL IMPRESSION / ASSESSMENT AND PLAN / ED COURSE Patient was made aware that this is bacterial vaginitis. Patient was placed on Flagyl 500 mg twice a day for 7 days. She also requested a Diflucan as she gets a yeast infection after taking antibiotics. She is to follow-up with her PCP or OB/GYN of her choice. ____________________________________________   FINAL CLINICAL IMPRESSION(S) / ED DIAGNOSES  Final diagnoses:  BV (bacterial vaginosis)     ED Discharge Orders        Ordered    fluconazole (DIFLUCAN) 150 MG tablet  Daily     09/25/17 1423    metroNIDAZOLE (FLAGYL) 500 MG tablet  2 times daily     09/25/17 1423       Note:  This document was prepared using Dragon voice recognition software and may include unintentional dictation errors.    Tommi RumpsSummers, Amiel Sharrow L, PA-C 09/25/17 1524    Emily FilbertWilliams, Jonathan E, MD 09/25/17 1537

## 2017-09-25 NOTE — ED Notes (Signed)
See triage note  States she developed vaginal itching and lower abd pain about 2 weeks ago

## 2017-12-13 ENCOUNTER — Emergency Department
Admission: EM | Admit: 2017-12-13 | Discharge: 2017-12-13 | Disposition: A | Discharge status: Home / Self Care | Payer: Self-pay | Attending: Emergency Medicine | Admitting: Emergency Medicine

## 2017-12-13 ENCOUNTER — Other Ambulatory Visit: Payer: Self-pay

## 2017-12-13 DIAGNOSIS — B9689 Other specified bacterial agents as the cause of diseases classified elsewhere: Secondary | ICD-10-CM | POA: Insufficient documentation

## 2017-12-13 DIAGNOSIS — N760 Acute vaginitis: Secondary | ICD-10-CM | POA: Insufficient documentation

## 2017-12-13 DIAGNOSIS — Z79899 Other long term (current) drug therapy: Secondary | ICD-10-CM | POA: Insufficient documentation

## 2017-12-13 DIAGNOSIS — F17210 Nicotine dependence, cigarettes, uncomplicated: Secondary | ICD-10-CM | POA: Insufficient documentation

## 2017-12-13 LAB — URINALYSIS, ROUTINE W REFLEX MICROSCOPIC
BILIRUBIN URINE: NEGATIVE
Bacteria, UA: NONE SEEN
Glucose, UA: NEGATIVE mg/dL
KETONES UR: NEGATIVE mg/dL
LEUKOCYTES UA: NEGATIVE
Nitrite: NEGATIVE
Protein, ur: NEGATIVE mg/dL
SPECIFIC GRAVITY, URINE: 1.016 (ref 1.005–1.030)
pH: 5 (ref 5.0–8.0)

## 2017-12-13 LAB — WET PREP, GENITAL
Clue Cells Wet Prep HPF POC: NONE SEEN
Sperm: NONE SEEN
Trich, Wet Prep: NONE SEEN
Yeast Wet Prep HPF POC: NONE SEEN

## 2017-12-13 LAB — POCT PREGNANCY, URINE: PREG TEST UR: NEGATIVE

## 2017-12-13 MED ORDER — METRONIDAZOLE 500 MG PO TABS
500.0000 mg | ORAL_TABLET | Freq: Once | ORAL | Status: AC
  Administered 2017-12-13: 500 mg via ORAL
  Filled 2017-12-13: qty 1

## 2017-12-13 MED ORDER — METRONIDAZOLE 500 MG PO TABS: 500.0000 mg | ORAL_TABLET | Freq: Two times a day (BID) | ORAL | 0 refills | Status: AC

## 2017-12-13 NOTE — ED Provider Notes (Signed)
[facility] Emergency Department Provider Note  ____________________________________________  Time seen: Approximately 6:37 PM  I have reviewed the triage vital signs and the nursing notes.   HISTORY  Chief Complaint Vaginal Itching    HPI Briana Lyons is a 37 y.o. female with a history of BV presents to the emergency department with vaginal pruritus and malodorous vaginal discharge for the past 2 weeks.  Patient reports no concern for STDs.  No dysuria, hematuria or increased urinary frequency.  Patient denies flank pain or dyspareunia.  No changes in vaginal discharge. No fever, nausea or abdominal pain. No alleviating measures have been attempted.  Past Medical History:  Diagnosis Date  . Herpes   . Ovarian cyst     There are no active problems to display for this patient.   Past Surgical History:  Procedure Laterality Date  . APPENDECTOMY    . CESAREAN SECTION    . TUBAL LIGATION      Prior to Admission medications   Medication Sig Start Date End Date Taking? Authorizing Provider  cyanocobalamin 1000 MCG tablet Take 1,000 mcg by mouth daily.    [provider]  fluconazole (DIFLUCAN) 150 MG tablet Take 1 tablet (150 mg total) by mouth daily. 09/25/17   Tommi Rumps, PA-C  ibuprofen (ADVIL,MOTRIN) 600 MG tablet Take 1 tablet (600 mg total) by mouth every 8 (eight) hours as needed. 01/06/16   Tommi Rumps, PA-C  metroNIDAZOLE (FLAGYL) 500 MG tablet Take 1 tablet (500 mg total) by mouth 2 (two) times daily for 7 days. 12/13/17 12/20/17  Orvil Feil, PA-C    Allergies Codeine  History reviewed. No pertinent family history.  Social History Social History   Tobacco Use  . Smoking status: Current Every Day Smoker    Packs/day: 0.50    Types: Cigarettes  . Smokeless tobacco: Never Used  Substance Use Topics  . Alcohol use: No  . Drug use: No     Review of Systems  Constitutional: No fever/chills Eyes: No  visual changes. No discharge ENT: No upper respiratory complaints. Cardiovascular: no chest pain. Respiratory: no cough. No SOB. Gastrointestinal: No abdominal pain.  No nausea, no vomiting.  No diarrhea.  No constipation. Genitourinary: Patient has vaginal itching and malodorous vaginal discharge.  Musculoskeletal: Negative for musculoskeletal pain. Skin: Negative for rash, abrasions, lacerations, ecchymosis. Neurological: Negative for headaches, focal weakness or numbness.   ____________________________________________   PHYSICAL EXAM:  VITAL SIGNS: ED Triage Vitals  Enc Vitals Group     BP 12/13/17 1421 (!) 144/79     Pulse Rate 12/13/17 1421 82     Resp 12/13/17 1421 18     Temp 12/13/17 1420 98.4 F (36.9 C)     Temp Source 12/13/17 1420 Oral     SpO2 12/13/17 1421 99 %     Weight 12/13/17 1421 185 lb (83.9 kg)     Height 12/13/17 1421 5\' 3"  (1.6 m)     Head Circumference --      Peak Flow --      Pain Score 12/13/17 1420 0     Pain Loc --      Pain Edu? --      Excl. in GC? --      Constitutional: Alert and oriented. Well appearing and in no acute distress. Eyes: Conjunctivae are normal. PERRL. EOMI. Head: Atraumatic. Cardiovascular: Normal rate, regular rhythm. Normal S1 and S2.  Good peripheral circulation. Respiratory: Normal respiratory effort without tachypnea or  retractions. Lungs CTAB. Good air entry to the bases with no decreased or absent breath sounds. Gastrointestinal: Bowel sounds 4 quadrants. Soft and nontender to palpation. No guarding or rigidity. No palpable masses. No distention. No CVA tenderness. Genitourinary: No cervical motion tenderness.  No mucopurulent discharge within the vaginal canal. Musculoskeletal: Full range of motion to all extremities. No gross deformities appreciated. Neurologic:  Normal speech and language. No gross focal neurologic deficits are appreciated.  Skin:  Skin is warm, dry and intact. No rash noted. Psychiatric: Mood  and affect are normal. Speech and behavior are normal. Patient exhibits appropriate insight and judgement.   ____________________________________________   LABS (all labs ordered are listed, but only abnormal results are displayed)  Labs Reviewed  WET PREP, GENITAL - Abnormal; Notable for the following components:      Result Value   WBC, Wet Prep HPF POC RARE (*)    All other components within normal limits  URINALYSIS, ROUTINE W REFLEX MICROSCOPIC - Abnormal; Notable for the following components:   Color, Urine YELLOW (*)    APPearance CLOUDY (*)    Hgb urine dipstick SMALL (*)    Squamous Epithelial / LPF 6-30 (*)    All other components within normal limits  POC URINE PREG, ED  POCT PREGNANCY, URINE   ____________________________________________  EKG   ____________________________________________  RADIOLOGY   No results found.  ____________________________________________    PROCEDURES  Procedure(s) performed:    Procedures    Medications  metroNIDAZOLE (FLAGYL) tablet 500 mg (500 mg Oral Given 12/13/17 1632)     ____________________________________________   INITIAL IMPRESSION / ASSESSMENT AND PLAN / ED COURSE  Pertinent labs & imaging results that were available during my care of the patient were reviewed by me and considered in my medical decision making (see chart for details).  Review of the [place] CSRS was performed in accordance of the [facility] prior to dispensing any controlled drugs.     Assessment and Plan:  BV Patient presents to the emergency department with vaginal pruritus and malodorous vaginal discharge.  Differential diagnosis includes BV, yeast vaginitis and STD.  History and physical exam findings are most consistent with BV at this time.  Patient was discharged with Flagyl.  All patient questions were answered.     ____________________________________________  FINAL CLINICAL IMPRESSION(S) / ED DIAGNOSES  Final diagnoses:   Bacterial vaginosis      NEW MEDICATIONS STARTED DURING THIS VISIT:  ED Discharge Orders        Ordered    metroNIDAZOLE (FLAGYL) 500 MG tablet  2 times daily     12/13/17 1624          This chart was dictated using voice recognition software/Dragon. Despite best efforts to proofread, errors can occur which can change the meaning. Any change was purely unintentional.    Orvil FeilWoods, Alonni Heimsoth M, PA-C 12/13/17 1845    Myrna BlazerSchaevitz, David Matthew, MD 12/13/17 2200

## 2017-12-13 NOTE — ED Triage Notes (Signed)
Pt states x 2 weeks vaginal irritation, drainage, foul odor. Sexually active. "feels wet down there." "itches on outside." denies vaginal bleeding. Alert, oriented, ambulatory. No distress noted.

## 2018-06-19 ENCOUNTER — Emergency Department: Payer: Self-pay

## 2018-06-19 ENCOUNTER — Emergency Department
Admission: EM | Admit: 2018-06-19 | Discharge: 2018-06-19 | Disposition: A | Discharge status: Home / Self Care | Payer: Self-pay | Attending: Emergency Medicine | Admitting: Emergency Medicine

## 2018-06-19 ENCOUNTER — Other Ambulatory Visit: Payer: Self-pay

## 2018-06-19 DIAGNOSIS — S20222A Contusion of left back wall of thorax, initial encounter: Secondary | ICD-10-CM | POA: Insufficient documentation

## 2018-06-19 DIAGNOSIS — S2020XA Contusion of thorax, unspecified, initial encounter: Secondary | ICD-10-CM

## 2018-06-19 DIAGNOSIS — Y999 Unspecified external cause status: Secondary | ICD-10-CM | POA: Insufficient documentation

## 2018-06-19 DIAGNOSIS — Y9352 Activity, horseback riding: Secondary | ICD-10-CM | POA: Insufficient documentation

## 2018-06-19 DIAGNOSIS — R079 Chest pain, unspecified: Secondary | ICD-10-CM | POA: Insufficient documentation

## 2018-06-19 DIAGNOSIS — F17210 Nicotine dependence, cigarettes, uncomplicated: Secondary | ICD-10-CM | POA: Insufficient documentation

## 2018-06-19 DIAGNOSIS — A609 Anogenital herpesviral infection, unspecified: Secondary | ICD-10-CM | POA: Insufficient documentation

## 2018-06-19 DIAGNOSIS — K8020 Calculus of gallbladder without cholecystitis without obstruction: Secondary | ICD-10-CM | POA: Insufficient documentation

## 2018-06-19 DIAGNOSIS — B009 Herpesviral infection, unspecified: Secondary | ICD-10-CM

## 2018-06-19 DIAGNOSIS — R1012 Left upper quadrant pain: Secondary | ICD-10-CM | POA: Insufficient documentation

## 2018-06-19 DIAGNOSIS — Y929 Unspecified place or not applicable: Secondary | ICD-10-CM | POA: Insufficient documentation

## 2018-06-19 LAB — CBC
HCT: 37.7 % (ref 35.0–47.0)
HEMOGLOBIN: 12.8 g/dL (ref 12.0–16.0)
MCH: 29.4 pg (ref 26.0–34.0)
MCHC: 33.9 g/dL (ref 32.0–36.0)
MCV: 86.8 fL (ref 80.0–100.0)
PLATELETS: 397 10*3/uL (ref 150–440)
RBC: 4.34 MIL/uL (ref 3.80–5.20)
RDW: 13.9 % (ref 11.5–14.5)
WBC: 10.7 10*3/uL (ref 3.6–11.0)

## 2018-06-19 LAB — COMPREHENSIVE METABOLIC PANEL
ALBUMIN: 4.4 g/dL (ref 3.5–5.0)
ALT: 23 U/L (ref 0–44)
ANION GAP: 7 (ref 5–15)
AST: 21 U/L (ref 15–41)
Alkaline Phosphatase: 70 U/L (ref 38–126)
BUN: 14 mg/dL (ref 6–20)
CALCIUM: 9.4 mg/dL (ref 8.9–10.3)
CO2: 25 mmol/L (ref 22–32)
Chloride: 106 mmol/L (ref 98–111)
Creatinine, Ser: 0.74 mg/dL (ref 0.44–1.00)
GFR calc Af Amer: >60 mL/min (ref >60)
GFR calc non Af Amer: >60 mL/min (ref >60)
GLUCOSE: 139 mg/dL — AB (ref 70–99)
Potassium: 3.6 mmol/L (ref 3.5–5.1)
Sodium: 138 mmol/L (ref 135–145)
TOTAL PROTEIN: 7.5 g/dL (ref 6.5–8.1)
Total Bilirubin: 0.3 mg/dL (ref 0.3–1.2)

## 2018-06-19 LAB — CHLAMYDIA/NGC RT PCR (ARMC ONLY)
CHLAMYDIA TR: NOT DETECTED
N GONORRHOEAE: NOT DETECTED

## 2018-06-19 LAB — POCT PREGNANCY, URINE: Preg Test, Ur: NEGATIVE

## 2018-06-19 LAB — WET PREP, GENITAL
CLUE CELLS WET PREP: NONE SEEN
Sperm: NONE SEEN
Trich, Wet Prep: NONE SEEN
YEAST WET PREP: NONE SEEN

## 2018-06-19 LAB — URINALYSIS, COMPLETE (UACMP) WITH MICROSCOPIC
Bacteria, UA: NONE SEEN
Bilirubin Urine: NEGATIVE
GLUCOSE, UA: NEGATIVE mg/dL
Ketones, ur: NEGATIVE mg/dL
Leukocytes, UA: NEGATIVE
NITRITE: NEGATIVE
PH: 6 (ref 5.0–8.0)
Protein, ur: NEGATIVE mg/dL
Specific Gravity, Urine: 1.014 (ref 1.005–1.030)

## 2018-06-19 LAB — LIPASE, BLOOD: Lipase: 27 U/L (ref 11–51)

## 2018-06-19 MED ORDER — OXYCODONE-ACETAMINOPHEN 5-325 MG PO TABS
1.0000 | ORAL_TABLET | Freq: Once | ORAL | Status: AC
  Administered 2018-06-19: 1 via ORAL
  Filled 2018-06-19: qty 1

## 2018-06-19 MED ORDER — FLUCONAZOLE 150 MG PO TABS: 150.0000 mg | ORAL_TABLET | Freq: Once | ORAL | 0 refills | Status: DC | PRN

## 2018-06-19 NOTE — ED Triage Notes (Addendum)
Pt to ER c/o recurrent abdominal pain to lower abdomen and itching to vagina area. Pt also states that she his having left side pain with movement since falling off her horse X 2 days ago. No LOC. Pt alert and oriented X4, active, cooperative, pt in NAD. RR even and unlabored, color WNL.

## 2018-06-19 NOTE — ED Provider Notes (Addendum)
[facility] Emergency Department Provider Note  ____________________________________________   First MD Initiated Contact with Patient 06/19/18 1659     (approximate)  I have reviewed the triage vital signs and the nursing notes.   HISTORY  Chief Complaint Abdominal Pain and Fall   HPI Briana Lyons is a 37 y.o. female with a history of herpes as well as ovarian cyst who was presented to the emergency department with vaginal itching and discharge over the past week as well as 2 days of left flank pain after falling off horse.  She thinks that something scared the horse which became startled causing her to fall off onto her left flank.  She does not report hitting her head or losing consciousness.  No headache or neck pain reported at this time.  Says the pain is worse with movement and palpation over the left flank.  Patient has a history of genital herpes and says that she has been off her Valtrex for 3 days now but has a prescription at home and says that she will be able to fill it this Friday.   Past Medical History:  Diagnosis Date  . Herpes   . Ovarian cyst     There are no active problems to display for this patient.   Past Surgical History:  Procedure Laterality Date  . APPENDECTOMY    . CESAREAN SECTION    . TUBAL LIGATION      Prior to Admission medications   Medication Sig Start Date End Date Taking? Authorizing Provider  cyanocobalamin 1000 MCG tablet Take 1,000 mcg by mouth daily.    [provider]  fluconazole (DIFLUCAN) 150 MG tablet Take 1 tablet (150 mg total) by mouth daily. 09/25/17   Tommi Rumps, PA-C  ibuprofen (ADVIL,MOTRIN) 600 MG tablet Take 1 tablet (600 mg total) by mouth every 8 (eight) hours as needed. 01/06/16   Tommi Rumps, PA-C    Allergies Codeine  No family history on file.  Social History Social History   Tobacco Use  . Smoking status: Current Every Day Smoker    Packs/day: 0.50      Types: Cigarettes  . Smokeless tobacco: Never Used  Substance Use Topics  . Alcohol use: No  . Drug use: No    Review of Systems  Constitutional: No fever/chills Eyes: No visual changes. ENT: No sore throat. Cardiovascular: Denies chest pain. Respiratory: Denies shortness of breath. Gastrointestinal: Left-sided flank pain.  No nausea, no vomiting.  No diarrhea.  No constipation. Genitourinary: Negative for dysuria. Musculoskeletal: Negative for back pain. Skin: Negative for rash. Neurological: Negative for headaches, focal weakness or numbness.   ____________________________________________   PHYSICAL EXAM:  VITAL SIGNS: ED Triage Vitals  Enc Vitals Group     BP 06/19/18 1621 130/78     Pulse Rate 06/19/18 1621 100     Resp 06/19/18 1621 20     Temp 06/19/18 1621 98.3 F (36.8 C)     Temp Source 06/19/18 1621 Oral     SpO2 06/19/18 1621 100 %     Weight 06/19/18 1622 184 lb 15.5 oz (83.9 kg)     Height 06/19/18 1622 5\' 4"  (1.626 m)     Head Circumference --      Peak Flow --      Pain Score 06/19/18 1621 8     Pain Loc --      Pain Edu? --      Excl. in GC? --  Constitutional: Alert and oriented. Well appearing and in no acute distress. Eyes: Conjunctivae are normal.  Head: Atraumatic. Nose: No congestion/rhinnorhea. Mouth/Throat: Mucous membranes are moist.  Neck: No stridor.   Cardiovascular: Normal rate, regular rhythm. Grossly normal heart sounds.  Respiratory: Normal respiratory effort.  No retractions. Lungs CTAB. Gastrointestinal: Soft and nontender. No distention.  Genitourinary:   No lesions visualized externally.  Speculum exam with a small amount of white, thick discharge.  No CMT.  No uterine or adnexal tenderness nor masses. Musculoskeletal: No lower extremity tenderness nor edema.  No joint effusions.  Tenderness palpation over the lateral ribs as well as the thoracic spine to the lower end of the thoracic spine and upper lumbar spine as  well as laterally linked to the thoracolumbar junction without any ecchymosis, crepitus.  Neurologic:  Normal speech and language. No gross focal neurologic deficits are appreciated. Skin:  Skin is warm, dry and intact. No rash noted. Psychiatric: Mood and affect are normal. Speech and behavior are normal.  ____________________________________________   LABS (all labs ordered are listed, but only abnormal results are displayed)  Labs Reviewed  WET PREP, GENITAL - Abnormal; Notable for the following components:      Result Value   WBC, Wet Prep HPF POC FEW (*)    All other components within normal limits  COMPREHENSIVE METABOLIC PANEL - Abnormal; Notable for the following components:   Glucose, Bld 139 (*)    All other components within normal limits  URINALYSIS, COMPLETE (UACMP) WITH MICROSCOPIC - Abnormal; Notable for the following components:   Color, Urine YELLOW (*)    APPearance CLEAR (*)    Hgb urine dipstick SMALL (*)    All other components within normal limits  CHLAMYDIA/NGC RT PCR ([facility] ONLY)  LIPASE, BLOOD  CBC  POC URINE PREG, ED  POCT PREGNANCY, URINE   ____________________________________________  EKG   ____________________________________________  RADIOLOGY  No acute finding found on the CT of the chest.  Cholelithiasis.____________________________________________   PROCEDURES  Procedure(s) performed:   Procedures  Critical Care performed:   ____________________________________________   INITIAL IMPRESSION / ASSESSMENT AND PLAN / ED COURSE  Pertinent labs & imaging results that were available during my care of the patient were reviewed by me and considered in my medical decision making (see chart for details).  Differential diagnosis includes, but is not limited to, biliary disease (biliary colic, acute cholecystitis, cholangitis, choledocholithiasis, etc), intrathoracic causes for epigastric abdominal pain including ACS, gastritis,  duodenitis, pancreatitis, small bowel or large bowel obstruction, abdominal aortic aneurysm, hernia, and ulcer(s). As part of my medical decision making, I reviewed the following data within the electronic MEDICAL RECORD NUMBER Notes from prior ED visits  ----------------------------------------- 6:42 PM on 06/19/2018 -----------------------------------------  Patient at this time updated about imaging results.  Likely contusion to the chest and flank.  Possible start of herpes flare.  Advised patient to fill prescription as soon as possible.  Aware of the cholelithiasis and will be following up as an outpatient.  Patient requesting Diflucan in case if symptoms persist despite the starting of her Valtrex.  She is understanding of the diagnosis as well as treatment and willing to comply.  Discussed symptomatic treatment such as over-the-counter NSAIDs and ice and heat to relieve pain. ____________________________________________   FINAL CLINICAL IMPRESSION(S) / ED DIAGNOSES  Contusion.  Genital herpes.  NEW MEDICATIONS STARTED DURING THIS VISIT:  New Prescriptions   No medications on file     Note:  This document was prepared using  Dragon [employer] engineer and may include unintentional dictation errors.      Myrna Blazer, MD 06/19/18 1843    Myrna Blazer, MD 06/19/18 [phone]

## 2018-09-27 ENCOUNTER — Emergency Department
Admission: EM | Admit: 2018-09-27 | Discharge: 2018-09-27 | Disposition: A | Discharge status: Home / Self Care | Payer: Self-pay | Attending: Emergency Medicine | Admitting: Emergency Medicine

## 2018-09-27 ENCOUNTER — Encounter: Payer: Self-pay | Admitting: Emergency Medicine

## 2018-09-27 DIAGNOSIS — Z5321 Procedure and treatment not carried out due to patient leaving prior to being seen by health care provider: Secondary | ICD-10-CM | POA: Insufficient documentation

## 2018-09-27 DIAGNOSIS — R110 Nausea: Secondary | ICD-10-CM | POA: Insufficient documentation

## 2018-09-27 LAB — COMPREHENSIVE METABOLIC PANEL
ALT: 18 U/L (ref 0–44)
AST: 19 U/L (ref 15–41)
Albumin: 4.4 g/dL (ref 3.5–5.0)
Alkaline Phosphatase: 79 U/L (ref 38–126)
Anion gap: 7 (ref 5–15)
BUN: 10 mg/dL (ref 6–20)
CO2: 27 mmol/L (ref 22–32)
Calcium: 9.2 mg/dL (ref 8.9–10.3)
Chloride: 105 mmol/L (ref 98–111)
Creatinine, Ser: 0.67 mg/dL (ref 0.44–1.00)
GFR calc non Af Amer: >60 mL/min (ref >60)
Glucose, Bld: 143 mg/dL — ABNORMAL HIGH (ref 70–99)
Potassium: 3.6 mmol/L (ref 3.5–5.1)
SODIUM: 139 mmol/L (ref 135–145)
Total Bilirubin: 0.4 mg/dL (ref 0.3–1.2)
Total Protein: 7.3 g/dL (ref 6.5–8.1)

## 2018-09-27 LAB — URINALYSIS, COMPLETE (UACMP) WITH MICROSCOPIC
Bilirubin Urine: NEGATIVE
GLUCOSE, UA: NEGATIVE mg/dL
Ketones, ur: NEGATIVE mg/dL
LEUKOCYTES UA: NEGATIVE
Nitrite: NEGATIVE
Protein, ur: NEGATIVE mg/dL
Specific Gravity, Urine: 1.009 (ref 1.005–1.030)
pH: 6 (ref 5.0–8.0)

## 2018-09-27 LAB — CBC
HCT: 38.9 % (ref 36.0–46.0)
Hemoglobin: 12.6 g/dL (ref 12.0–15.0)
MCH: 29.1 pg (ref 26.0–34.0)
MCHC: 32.4 g/dL (ref 30.0–36.0)
MCV: 89.8 fL (ref 80.0–100.0)
NRBC: 0 % (ref 0.0–0.2)
Platelets: 398 10*3/uL (ref 150–400)
RBC: 4.33 MIL/uL (ref 3.87–5.11)
RDW: 13.9 % (ref 11.5–15.5)
WBC: 8.8 10*3/uL (ref 4.0–10.5)

## 2018-09-27 LAB — LIPASE, BLOOD: Lipase: 101 U/L — ABNORMAL HIGH (ref 11–51)

## 2018-09-27 NOTE — ED Triage Notes (Signed)
Pt reports vaginal discharge for a couple of week with an odor that is causing her belly to hurt. Pt also with sinus pressure, nasal congestion and cough for the past couple of weeks. Pt reports has had her gallbladder looked at in the past and may need it removed.

## 2018-09-27 NOTE — ED Notes (Signed)
Pt called from WR to room, no response 

## 2018-09-27 NOTE — ED Notes (Signed)
No answer when called from the lobby x3. Pt not found in the restroom or any of the other waiting areas. Pt to be disposed LWBS after Triage and will removed from the waiting room list at this time.

## 2018-09-27 NOTE — ED Notes (Signed)
Attempted to reassess and recheck pt BP. No response.

## 2018-09-28 ENCOUNTER — Telehealth: Payer: Self-pay | Admitting: Emergency Medicine
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
## 2018-11-08 ENCOUNTER — Emergency Department
Admission: EM | Admit: 2018-11-08 | Discharge: 2018-11-08 | Disposition: A | Discharge status: Home / Self Care | Payer: Self-pay | Attending: Emergency Medicine | Admitting: Emergency Medicine

## 2018-11-08 ENCOUNTER — Emergency Department: Payer: Self-pay

## 2018-11-08 ENCOUNTER — Encounter: Payer: Self-pay | Admitting: Emergency Medicine

## 2018-11-08 DIAGNOSIS — S300XXA Contusion of lower back and pelvis, initial encounter: Secondary | ICD-10-CM | POA: Insufficient documentation

## 2018-11-08 DIAGNOSIS — T07XXXA Unspecified multiple injuries, initial encounter: Secondary | ICD-10-CM

## 2018-11-08 DIAGNOSIS — S20212A Contusion of left front wall of thorax, initial encounter: Secondary | ICD-10-CM | POA: Insufficient documentation

## 2018-11-08 DIAGNOSIS — Y929 Unspecified place or not applicable: Secondary | ICD-10-CM | POA: Insufficient documentation

## 2018-11-08 DIAGNOSIS — Y9389 Activity, other specified: Secondary | ICD-10-CM | POA: Insufficient documentation

## 2018-11-08 DIAGNOSIS — Y998 Other external cause status: Secondary | ICD-10-CM | POA: Insufficient documentation

## 2018-11-08 DIAGNOSIS — F17210 Nicotine dependence, cigarettes, uncomplicated: Secondary | ICD-10-CM | POA: Insufficient documentation

## 2018-11-08 DIAGNOSIS — Z23 Encounter for immunization: Secondary | ICD-10-CM | POA: Insufficient documentation

## 2018-11-08 DIAGNOSIS — Z79899 Other long term (current) drug therapy: Secondary | ICD-10-CM | POA: Insufficient documentation

## 2018-11-08 DIAGNOSIS — R51 Headache: Secondary | ICD-10-CM | POA: Insufficient documentation

## 2018-11-08 DIAGNOSIS — S0083XA Contusion of other part of head, initial encounter: Secondary | ICD-10-CM | POA: Insufficient documentation

## 2018-11-08 MED ORDER — TRAMADOL HCL 50 MG PO TABS
50.0000 mg | ORAL_TABLET | Freq: Once | ORAL | Status: AC
  Administered 2018-11-08: 50 mg via ORAL
  Filled 2018-11-08: qty 1

## 2018-11-08 MED ORDER — MELOXICAM 15 MG PO TABS: 15.0000 mg | ORAL_TABLET | Freq: Every day | ORAL | 2 refills | Status: AC

## 2018-11-08 MED ORDER — BACLOFEN 10 MG PO TABS: 10.0000 mg | ORAL_TABLET | Freq: Three times a day (TID) | ORAL | 1 refills | Status: AC

## 2018-11-08 MED ORDER — TETANUS-DIPHTH-ACELL PERTUSSIS 5-2.5-18.5 LF-MCG/0.5 IM SUSP
0.5000 mL | Freq: Once | INTRAMUSCULAR | Status: AC
  Administered 2018-11-08: 0.5 mL via INTRAMUSCULAR
  Filled 2018-11-08: qty 0.5
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
## 2018-11-08 NOTE — Discharge Instructions (Addendum)
Follow-up with the acute care and return to the emergency department if worsening.  Put ice on all areas that hurt.  Take the medication as needed for muscle type pain and contusions.  Stay in a safe place.  Call 911 if you feel that you are in danger.
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
## 2018-12-24 ENCOUNTER — Other Ambulatory Visit: Payer: Self-pay

## 2018-12-24 ENCOUNTER — Encounter: Payer: Self-pay | Admitting: Emergency Medicine

## 2018-12-24 ENCOUNTER — Emergency Department
Admission: EM | Admit: 2018-12-24 | Discharge: 2018-12-24 | Disposition: A | Discharge status: Home / Self Care | Payer: Self-pay | Attending: Emergency Medicine | Admitting: Emergency Medicine

## 2018-12-24 DIAGNOSIS — N390 Urinary tract infection, site not specified: Secondary | ICD-10-CM | POA: Insufficient documentation

## 2018-12-24 DIAGNOSIS — Z79899 Other long term (current) drug therapy: Secondary | ICD-10-CM | POA: Insufficient documentation

## 2018-12-24 DIAGNOSIS — F17210 Nicotine dependence, cigarettes, uncomplicated: Secondary | ICD-10-CM | POA: Insufficient documentation

## 2018-12-24 LAB — URINALYSIS, COMPLETE (UACMP) WITH MICROSCOPIC
Bacteria, UA: NONE SEEN
Bilirubin Urine: NEGATIVE
Glucose, UA: NEGATIVE mg/dL
KETONES UR: NEGATIVE mg/dL
Nitrite: POSITIVE — AB
Protein, ur: NEGATIVE mg/dL
Specific Gravity, Urine: 1.013 (ref 1.005–1.030)
pH: 6 (ref 5.0–8.0)

## 2018-12-24 LAB — POCT PREGNANCY, URINE: Preg Test, Ur: NEGATIVE

## 2018-12-24 MED ORDER — CEPHALEXIN 500 MG PO CAPS: 500.0000 mg | ORAL_CAPSULE | Freq: Two times a day (BID) | ORAL | 0 refills | Status: DC
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
## 2018-12-24 NOTE — ED Notes (Signed)
ED Provider at bedside. 

## 2018-12-24 NOTE — ED Provider Notes (Signed)
[facility] Emergency Department Provider Note   ____________________________________________    I have reviewed the triage vital signs and the nursing notes.   HISTORY  Chief Complaint Abdominal Pain     HPI Briana Lyons is a 38 y.o. female who presents with complaints of mild right flank pain some abdominal cramping and nausea.  Patient reports symptoms have been ongoing for about a week, she has had urinary frequency and urgency during that time.  Denies fevers or chills some nausea but no vomiting.  Normal stools.  Has taken Azo with little improvement.  History of kidney stones  Past Medical History:  Diagnosis Date  . Herpes   . Ovarian cyst     There are no active problems to display for this patient.   Past Surgical History:  Procedure Laterality Date  . APPENDECTOMY    . CESAREAN SECTION    . TUBAL LIGATION      Prior to Admission medications   Medication Sig Start Date End Date Taking? Authorizing Provider  baclofen (LIORESAL) 10 MG tablet Take 1 tablet (10 mg total) by mouth 3 (three) times daily. 11/08/18 11/08/19  Fisher, Roselyn Bering, PA-C  cephALEXin (KEFLEX) 500 MG capsule Take 1 capsule (500 mg total) by mouth 2 (two) times daily. 12/24/18   Jene Every, MD  cyanocobalamin 1000 MCG tablet Take 1,000 mcg by mouth daily.    [provider]  meloxicam (MOBIC) 15 MG tablet Take 1 tablet (15 mg total) by mouth daily. 11/08/18 11/08/19  Faythe Ghee, PA-C     Allergies Codeine  No family history on file.  Social History Social History   Tobacco Use  . Smoking status: Current Every Day Smoker    Packs/day: 0.50    Types: Cigarettes  . Smokeless tobacco: Never Used  Substance Use Topics  . Alcohol use: No  . Drug use: No    Review of Systems  Constitutional: No fever/chills Eyes: No visual changes.  ENT: No sore throat. Cardiovascular: Denies chest pain. Respiratory: Denies shortness of  breath. Gastrointestinal: As above Genitourinary: Negative for dysuria.  As above.  Denies vaginal discharge today Musculoskeletal: Negative for back pain. Skin: Negative for rash. Neurological: Negative for headaches or weakness   ____________________________________________   PHYSICAL EXAM:  VITAL SIGNS: ED Triage Vitals  Enc Vitals Group     BP 12/24/18 1112 (!) 138/94     Pulse Rate 12/24/18 1112 95     Resp 12/24/18 1112 16     Temp 12/24/18 1112 98.6 F (37 C)     Temp Source 12/24/18 1112 Oral     SpO2 12/24/18 1112 100 %     Weight 12/24/18 1117 74.8 kg (165 lb)     Height 12/24/18 1117 1.626 m (5\' 4" )     Head Circumference --      Peak Flow --      Pain Score 12/24/18 1117 8     Pain Loc --      Pain Edu? --      Excl. in GC? --     Constitutional: Alert and oriented. No acute distress. Pleasant and interactive  Nose: No congestion/rhinnorhea. Mouth/Throat: Mucous membranes are moist.    Cardiovascular: Normal rate, regular rhythm.  Good peripheral circulation. Respiratory: Normal respiratory effort.  No retractions. Lungs CTAB. Gastrointestinal: Soft and nontender. No distention.  No CVA tenderness.  Reassuring exam Genitourinary: deferred Musculoskeletal:   Warm and well perfused Neurologic:  Normal speech  and language. No gross focal neurologic deficits are appreciated.  Skin:  Skin is warm, dry and intact. No rash noted. Psychiatric: Mood and affect are normal. Speech and behavior are normal.  ____________________________________________   LABS (all labs ordered are listed, but only abnormal results are displayed)  Labs Reviewed  URINALYSIS, COMPLETE (UACMP) WITH MICROSCOPIC - Abnormal; Notable for the following components:      Result Value   Color, Urine AMBER (*)    APPearance CLEAR (*)    Hgb urine dipstick SMALL (*)    Nitrite POSITIVE (*)    Leukocytes,Ua TRACE (*)    All other components within normal limits  POCT PREGNANCY, URINE    ____________________________________________  EKG  None ____________________________________________  RADIOLOGY  None ____________________________________________   PROCEDURES  Procedure(s) performed: No  Procedures   Critical Care performed: No ____________________________________________   INITIAL IMPRESSION / ASSESSMENT AND PLAN / ED COURSE  Pertinent labs & imaging results that were available during my care of the patient were reviewed by me and considered in my medical decision making (see chart for details).  Patient presents with complaints of urinary frequency, urgency, some nausea no fevers.  Overall very well-appearing reassuring exam, normal vitals, suspicious for urinary tract infection, pending urinalysis.  Urinalysis consistent with urinary tract infection, will treat with Keflex, outpatient follow-up, return precautions discussed    ____________________________________________   FINAL CLINICAL IMPRESSION(S) / ED DIAGNOSES  Final diagnoses:  Lower urinary tract infectious disease        Note:  This document was prepared using Dragon voice recognition software and may include unintentional dictation errors.   Jene Every, MD 12/24/18 1228

## 2019-02-04 ENCOUNTER — Other Ambulatory Visit: Payer: Self-pay

## 2019-02-04 ENCOUNTER — Emergency Department
Admission: EM | Admit: 2019-02-04 | Discharge: 2019-02-04 | Disposition: A | Discharge status: Home / Self Care | Payer: Self-pay | Attending: Emergency Medicine | Admitting: Emergency Medicine

## 2019-02-04 ENCOUNTER — Emergency Department: Payer: Self-pay

## 2019-02-04 ENCOUNTER — Encounter: Payer: Self-pay | Admitting: Radiology

## 2019-02-04 DIAGNOSIS — R3129 Other microscopic hematuria: Secondary | ICD-10-CM | POA: Insufficient documentation

## 2019-02-04 DIAGNOSIS — Z791 Long term (current) use of non-steroidal anti-inflammatories (NSAID): Secondary | ICD-10-CM | POA: Insufficient documentation

## 2019-02-04 DIAGNOSIS — B9689 Other specified bacterial agents as the cause of diseases classified elsewhere: Secondary | ICD-10-CM

## 2019-02-04 DIAGNOSIS — N760 Acute vaginitis: Secondary | ICD-10-CM | POA: Insufficient documentation

## 2019-02-04 DIAGNOSIS — K8000 Calculus of gallbladder with acute cholecystitis without obstruction: Secondary | ICD-10-CM | POA: Insufficient documentation

## 2019-02-04 DIAGNOSIS — Z79899 Other long term (current) drug therapy: Secondary | ICD-10-CM | POA: Insufficient documentation

## 2019-02-04 DIAGNOSIS — F17210 Nicotine dependence, cigarettes, uncomplicated: Secondary | ICD-10-CM | POA: Insufficient documentation

## 2019-02-04 DIAGNOSIS — E860 Dehydration: Secondary | ICD-10-CM | POA: Insufficient documentation

## 2019-02-04 DIAGNOSIS — K8080 Other cholelithiasis without obstruction: Secondary | ICD-10-CM

## 2019-02-04 LAB — WET PREP, GENITAL
Sperm: NONE SEEN
Trich, Wet Prep: NONE SEEN
Yeast Wet Prep HPF POC: NONE SEEN

## 2019-02-04 LAB — COMPREHENSIVE METABOLIC PANEL
ALT: 14 U/L (ref 0–44)
AST: 15 U/L (ref 15–41)
Albumin: 4 g/dL (ref 3.5–5.0)
Alkaline Phosphatase: 63 U/L (ref 38–126)
Anion gap: 9 (ref 5–15)
BUN: 18 mg/dL (ref 6–20)
CO2: 27 mmol/L (ref 22–32)
Calcium: 9.3 mg/dL (ref 8.9–10.3)
Chloride: 103 mmol/L (ref 98–111)
Creatinine, Ser: 0.7 mg/dL (ref 0.44–1.00)
GFR calc Af Amer: >60 mL/min (ref >60)
GFR calc non Af Amer: >60 mL/min (ref >60)
Glucose, Bld: 83 mg/dL (ref 70–99)
Potassium: 4.2 mmol/L (ref 3.5–5.1)
Sodium: 139 mmol/L (ref 135–145)
Total Bilirubin: 0.4 mg/dL (ref 0.3–1.2)
Total Protein: 7 g/dL (ref 6.5–8.1)

## 2019-02-04 LAB — URINALYSIS, COMPLETE (UACMP) WITH MICROSCOPIC
Bacteria, UA: NONE SEEN
Bilirubin Urine: NEGATIVE
Glucose, UA: NEGATIVE mg/dL
Ketones, ur: NEGATIVE mg/dL
Leukocytes,Ua: NEGATIVE
Nitrite: NEGATIVE
Protein, ur: NEGATIVE mg/dL
Specific Gravity, Urine: 1.019 (ref 1.005–1.030)
pH: 6 (ref 5.0–8.0)

## 2019-02-04 LAB — CHLAMYDIA/NGC RT PCR (ARMC ONLY): N gonorrhoeae: NOT DETECTED

## 2019-02-04 LAB — CBC
HCT: 35.1 % — ABNORMAL LOW (ref 36.0–46.0)
Hemoglobin: 11.3 g/dL — ABNORMAL LOW (ref 12.0–15.0)
MCH: 28.7 pg (ref 26.0–34.0)
MCHC: 32.2 g/dL (ref 30.0–36.0)
MCV: 89.1 fL (ref 80.0–100.0)
Platelets: 398 10*3/uL (ref 150–400)
RBC: 3.94 MIL/uL (ref 3.87–5.11)
RDW: 13.2 % (ref 11.5–15.5)
WBC: 9.4 10*3/uL (ref 4.0–10.5)
nRBC: 0 % (ref 0.0–0.2)

## 2019-02-04 LAB — PREGNANCY, URINE: Preg Test, Ur: NEGATIVE

## 2019-02-04 LAB — LIPASE, BLOOD: Lipase: 28 U/L (ref 11–51)

## 2019-02-04 LAB — CHLAMYDIA/NGC RT PCR (ARMC ONLY)??????????: Chlamydia Tr: NOT DETECTED

## 2019-02-04 MED ORDER — METRONIDAZOLE 500 MG PO TABS
500.0000 mg | ORAL_TABLET | Freq: Once | ORAL | Status: AC
  Administered 2019-02-04: 500 mg via ORAL
  Filled 2019-02-04: qty 1

## 2019-02-04 MED ORDER — METRONIDAZOLE 500 MG PO TABS: 500.0000 mg | ORAL_TABLET | Freq: Two times a day (BID) | ORAL | 0 refills | Status: DC

## 2019-02-04 MED ORDER — METRONIDAZOLE 500 MG PO TABS: 500.0000 mg | ORAL_TABLET | Freq: Two times a day (BID) | ORAL | 0 refills | Status: AC

## 2019-02-04 MED ORDER — IOHEXOL 300 MG/ML  SOLN
100.0000 mL | Freq: Once | INTRAMUSCULAR | Status: AC | PRN
  Administered 2019-02-04: 100 mL via INTRAVENOUS
  Filled 2019-02-04: qty 100

## 2019-02-04 MED ORDER — SODIUM CHLORIDE 0.9 % IV BOLUS
1000.0000 mL | Freq: Once | INTRAVENOUS | Status: AC
  Administered 2019-02-04: 1000 mL via INTRAVENOUS

## 2019-02-04 NOTE — Discharge Instructions (Addendum)
Please drink plenty of water and reduce your [facility] intake.
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
## 2019-02-04 NOTE — ED Notes (Signed)
See triage note  Presents with lower back pain  States pain is mainly to right flank area which started couple of days ago   States she was able to feel slightly better after taking AZO and cranberry juice  States developed some weakness again this am   States pain is now moving up into right upper back

## 2019-02-04 NOTE — ED Notes (Signed)
Pt given soda and graham crackers.

## 2019-02-04 NOTE — ED Provider Notes (Signed)
[facility] Emergency Department Provider Note  ____________________________________________  Time seen: Approximately 1:41 PM  I have reviewed the triage vital signs and the nursing notes.   HISTORY  Chief Complaint Recurrent UTI    HPI Briana Lyons is a 38 y.o. female presents emergency department for evaluation of back pain, low mid abdominal pain, white vaginal discharge, vaginal pain, urinary frequency for 1 week.  She states that last week she felt weak but this improved over the weekend.  She was given antibiotics for urinary tract infection about 1 month ago and never finished prescription.  She has been taking Azo because she thought her urinary tract infection never resolved.  She discontinued Azo 2 days ago and started to feel worse again.  Pain currently is to her lower back.  Earlier yesterday she had pain to her mid back pain as well.  Patient states that she only drinks [employer] and no water.  She is concerned that she may be dehydrated.  No recent URI.  No fever, nausea, vomiting.   Past Medical History:  Diagnosis Date  . Herpes   . Ovarian cyst     There are no active problems to display for this patient.   Past Surgical History:  Procedure Laterality Date  . APPENDECTOMY    . CESAREAN SECTION    . TUBAL LIGATION      Prior to Admission medications   Medication Sig Start Date End Date Taking? Authorizing Provider  baclofen (LIORESAL) 10 MG tablet Take 1 tablet (10 mg total) by mouth 3 (three) times daily. 11/08/18 11/08/19  Fisher, Roselyn BeringSusan W, PA-C  cyanocobalamin 1000 MCG tablet Take 1,000 mcg by mouth daily.    [provider]  meloxicam (MOBIC) 15 MG tablet Take 1 tablet (15 mg total) by mouth daily. 11/08/18 11/08/19  Fisher, Roselyn BeringSusan W, PA-C  metroNIDAZOLE (FLAGYL) 500 MG tablet Take 1 tablet (500 mg total) by mouth 2 (two) times daily for 7 days. 02/04/19 02/11/19  Enid DerryWagner, Luismanuel Corman, PA-C    Allergies Codeine  No family  history on file.  Social History Social History   Tobacco Use  . Smoking status: Current Every Day Smoker    Packs/day: 0.50    Types: Cigarettes  . Smokeless tobacco: Never Used  Substance Use Topics  . Alcohol use: No  . Drug use: No     Review of Systems  Constitutional: No fever/chills Cardiovascular: No chest pain. Respiratory:  No SOB. Gastrointestinal: Positive for abdominal pain.  No nausea, no vomiting.  Musculoskeletal: Positive for back pain. Skin: Negative for rash, abrasions, lacerations, ecchymosis. Neurological: Negative for headaches, numbness or tingling   ____________________________________________   PHYSICAL EXAM:  VITAL SIGNS: ED Triage Vitals  Enc Vitals Group     BP 02/04/19 1148 (!) 144/80     Pulse Rate 02/04/19 1148 (!) 107     Resp 02/04/19 1148 14     Temp 02/04/19 1148 98.2 F (36.8 C)     Temp Source 02/04/19 1148 Oral     SpO2 02/04/19 1148 98 %     Weight 02/04/19 1149 175 lb (79.4 kg)     Height 02/04/19 1149 5\' 3"  (1.6 m)     Head Circumference --      Peak Flow --      Pain Score 02/04/19 1148 8     Pain Loc --      Pain Edu? --      Excl. in GC? --  Constitutional: Alert and oriented. Well appearing and in no acute distress. Eyes: Conjunctivae are normal. PERRL. EOMI. Head: Atraumatic. ENT:      Ears:      Nose: No congestion/rhinnorhea.      Mouth/Throat: Mucous membranes are moist.  Neck: No stridor.   Cardiovascular: Normal rate, regular rhythm.  Good peripheral circulation. Respiratory: Normal respiratory effort without tachypnea or retractions. Lungs CTAB. Good air entry to the bases with no decreased or absent breath sounds. Gastrointestinal: Bowel sounds 4 quadrants. Soft and nontender to palpation. No guarding or rigidity. No palpable masses. No distention. No CVA tenderness. Genitourinary: No external rashes or lesions.  White thin vaginal discharge.  No cervical motion tenderness. Musculoskeletal: Full  range of motion to all extremities. No gross deformities appreciated. Neurologic:  Normal speech and language. No gross focal neurologic deficits are appreciated.  Skin:  Skin is warm, dry and intact. No rash noted. Psychiatric: Mood and affect are normal. Speech and behavior are normal. Patient exhibits appropriate insight and judgement.   ____________________________________________   LABS (all labs ordered are listed, but only abnormal results are displayed)  Labs Reviewed  WET PREP, GENITAL - Abnormal; Notable for the following components:      Result Value   Clue Cells Wet Prep HPF POC PRESENT (*)    WBC, Wet Prep HPF POC FEW (*)    All other components within normal limits  URINALYSIS, COMPLETE (UACMP) WITH MICROSCOPIC - Abnormal; Notable for the following components:   Color, Urine YELLOW (*)    APPearance HAZY (*)    Hgb urine dipstick MODERATE (*)    All other components within normal limits  CBC - Abnormal; Notable for the following components:   Hemoglobin 11.3 (*)    HCT 35.1 (*)    All other components within normal limits  CHLAMYDIA/NGC RT PCR ([facility] ONLY)  URINE CULTURE  PREGNANCY, URINE  COMPREHENSIVE METABOLIC PANEL  LIPASE, BLOOD   ____________________________________________  EKG   ____________________________________________  RADIOLOGY Lexine Baton, personally viewed and evaluated these images (plain radiographs) as part of my medical decision making, as well as reviewing the written report by the radiologist.  Ct Abdomen Pelvis W Contrast  Result Date: 02/04/2019 CLINICAL DATA:  38 year old female with right flank/lower back pain EXAM: CT ABDOMEN AND PELVIS WITH CONTRAST TECHNIQUE: Multidetector CT imaging of the abdomen and pelvis was performed using the standard protocol following bolus administration of intravenous contrast. CONTRAST:  OMNIPAQUE IOHEXOL 300 MG/ML  SOLN COMPARISON:  Prior CT scan of the abdomen and pelvis 02/23/2011  FINDINGS: Lower chest: The lung bases are clear. Visualized cardiac structures are within normal limits for size. No pericardial effusion. Unremarkable visualized distal thoracic esophagus. Hepatobiliary: Geographic hypoattenuation in the left hemi-liver adjacent to the fissure for the falciform ligament is nonspecific but most suggestive of benign focal fatty infiltration. High attenuation material layers within the gallbladder consistent with cholelithiasis. No evidence of intra or extrahepatic biliary ductal dilatation or acute cholecystitis. No focal liver lesion. Pancreas: Unremarkable. No pancreatic ductal dilatation or surrounding inflammatory changes. Spleen: Normal in size without focal abnormality. Adrenals/Urinary Tract: Adrenal glands are unremarkable. Kidneys are normal, without renal calculi, focal lesion, or hydronephrosis. Bladder is unremarkable. Stomach/Bowel: No evidence of obstruction or focal bowel wall thickening. The appendix is surgically absent. The terminal ileum is unremarkable. Vascular/Lymphatic: No significant vascular findings are present. No enlarged abdominal or pelvic lymph nodes. Reproductive: Uterus and bilateral adnexa are unremarkable. Other: No abdominal wall hernia or abnormality. No  abdominopelvic ascites. Musculoskeletal: No acute or significant osseous findings. Degenerative disc disease is evident at L4-L5 and L5-S1 with suspected posterior disc bulges. IMPRESSION: 1. No acute abnormality within the abdomen or pelvis. 2. Lower lumbar degenerative disc disease. 3. Cholelithiasis. Electronically Signed   By: Malachy Moan M.D.   On: 02/04/2019 15:19    ____________________________________________    PROCEDURES  Procedure(s) performed:    Procedures    Medications  sodium chloride 0.9 % bolus 1,000 mL (0 mLs Intravenous Stopped 02/04/19 1633)  iohexol (OMNIPAQUE) 300 MG/ML solution 100 mL (100 mLs Intravenous Contrast Given 02/04/19 1504)  metroNIDAZOLE  (FLAGYL) tablet 500 mg (500 mg Oral Given 02/04/19 1657)     ____________________________________________   INITIAL IMPRESSION / ASSESSMENT AND PLAN / ED COURSE  Pertinent labs & imaging results that were available during my care of the patient were reviewed by me and considered in my medical decision making (see chart for details).  Review of the [place] CSRS was performed in accordance of the [facility] prior to dispensing any controlled drugs.     Patient's diagnosis is consistent with bacterial vaginosis, microscopic hematuria, dehydration.  Vital signs and exam are reassuring.  Patient appears well and is asking for food.  She is eating graham crackers and juice in the emergency department.  She is also requesting to go to the vending machine and a sandwich.  Urinalysis shows hemoglobin and red blood cells.  This is likely due to patient not drinking any water and only drinking [employer].  Urine will be sent for culture.  She will increase fluids.  Wet prep consistent with bacterial vaginosis.  CT negative for acute abnormalities.  Findings of cholelithiasis and osteoarthritis were discussed with patient.  Patient will be discharged home with prescriptions for Flagyl. Patient is to follow up with primary care, gynecology, general surgery as directed.  She was given referrals to each.  Patient is given ED precautions to return to the ED for any worsening or new symptoms.     ____________________________________________  FINAL CLINICAL IMPRESSION(S) / ED DIAGNOSES  Final diagnoses:  Other microscopic hematuria  Bacterial vaginosis  Dehydration  Biliary calculus of other site without obstruction      NEW MEDICATIONS STARTED DURING THIS VISIT:  ED Discharge Orders         Ordered    metroNIDAZOLE (FLAGYL) 500 MG tablet  2 times daily,   Status:  Discontinued     02/04/19 1614    metroNIDAZOLE (FLAGYL) 500 MG tablet  2 times daily     02/04/19 1654              This chart  was dictated using voice recognition software/Dragon. Despite best efforts to proofread, errors can occur which can change the meaning. Any change was purely unintentional.    Enid Derry, PA-C 02/04/19 1926    Don Perking, [place], MD 02/06/19 1550

## 2019-02-06 LAB — URINE CULTURE: Culture: NO GROWTH

## 2019-02-28 ENCOUNTER — Encounter: Payer: Self-pay | Admitting: Emergency Medicine

## 2019-02-28 ENCOUNTER — Emergency Department
Admission: EM | Admit: 2019-02-28 | Discharge: 2019-02-28 | Disposition: A | Discharge status: Home / Self Care | Payer: Self-pay | Attending: Emergency Medicine | Admitting: Emergency Medicine

## 2019-02-28 ENCOUNTER — Other Ambulatory Visit: Payer: Self-pay

## 2019-02-28 DIAGNOSIS — L03811 Cellulitis of head [any part, except face]: Secondary | ICD-10-CM | POA: Insufficient documentation

## 2019-02-28 MED ORDER — CLINDAMYCIN PHOSPHATE 600 MG/4ML IJ SOLN
600.0000 mg | Freq: Once | INTRAMUSCULAR | Status: AC
  Administered 2019-02-28: 600 mg via INTRAMUSCULAR
  Filled 2019-02-28: qty 4

## 2019-02-28 MED ORDER — IBUPROFEN 600 MG PO TABS
600.0000 mg | ORAL_TABLET | Freq: Once | ORAL | Status: AC
  Administered 2019-02-28: 600 mg via ORAL
  Filled 2019-02-28: qty 1

## 2019-02-28 MED ORDER — CLINDAMYCIN HCL 300 MG PO CAPS: 300.0000 mg | ORAL_CAPSULE | Freq: Four times a day (QID) | ORAL | 0 refills | Status: DC

## 2019-02-28 MED ORDER — CLINDAMYCIN HCL 300 MG PO CAPS: 300.0000 mg | ORAL_CAPSULE | Freq: Four times a day (QID) | ORAL | 0 refills | Status: AC

## 2019-02-28 NOTE — ED Provider Notes (Signed)
[facility] Emergency Department Provider Note  ____________________________________________  Time seen: Approximately 3:58 PM  I have reviewed the triage vital signs and the nursing notes.   HISTORY  Chief Complaint Abscess    HPI Briana [facility]ons is a 38 y.o. female that presents to the emergency department for evaluation of red painful area to left hairline that started 2 days ago.  Patient states that there is a spot that she is concerned could be an insect bite.  Her family member looked at it and thought maybe it was a mole.  There is some redness around this spot.  She did not see any bug, spider, tick.  Area is sore.  No fever, body aches.   Past Medical History:  Diagnosis Date  . Herpes   . Ovarian cyst     There are no active problems to display for this patient.   Past Surgical History:  Procedure Laterality Date  . APPENDECTOMY    . CESAREAN SECTION    . TUBAL LIGATION      Prior to Admission medications   Medication Sig Start Date End Date Taking? Authorizing Provider  baclofen (LIORESAL) 10 MG tablet Take 1 tablet (10 mg total) by mouth 3 (three) times daily. 11/08/18 11/08/19  Fisher, Roselyn Bering, PA-C  clindamycin (CLEOCIN) 300 MG capsule Take 1 capsule (300 mg total) by mouth 4 (four) times daily for 10 days. 02/28/19 03/10/19  Enid Derry, PA-C  cyanocobalamin 1000 MCG tablet Take 1,000 mcg by mouth daily.    [provider]  meloxicam (MOBIC) 15 MG tablet Take 1 tablet (15 mg total) by mouth daily. 11/08/18 11/08/19  Faythe Ghee, PA-C    Allergies Codeine  No family history on file.  Social History Social History   Tobacco Use  . Smoking status: Current Every Day Smoker    Packs/day: 0.50    Types: Cigarettes  . Smokeless tobacco: Never Used  Substance Use Topics  . Alcohol use: No  . Drug use: No     Review of Systems  Constitutional: No fever/chills Cardiovascular: No chest pain. Respiratory:  No  SOB. Gastrointestinal: No abdominal pain.  No nausea, no vomiting.  Musculoskeletal: Negative for musculoskeletal pain. Skin: Negative for abrasions, lacerations, ecchymosis. Positive for rash.  Neurological: Negative for headaches, numbness or tingling   ____________________________________________   PHYSICAL EXAM:  VITAL SIGNS: ED Triage Vitals  Enc Vitals Group     BP 02/28/19 1330 (!) 130/95     Pulse Rate 02/28/19 1330 100     Resp 02/28/19 1330 20     Temp 02/28/19 1330 98.4 F (36.9 C)     Temp Source 02/28/19 1330 Oral     SpO2 02/28/19 1330 99 %     Weight 02/28/19 1313 170 lb (77.1 kg)     Height 02/28/19 1313 5\' 3"  (1.6 m)     Head Circumference --      Peak Flow --      Pain Score 02/28/19 1313 9     Pain Loc --      Pain Edu? --      Excl. in GC? --      Constitutional: Alert and oriented. Well appearing and in no acute distress. Eyes: Conjunctivae are normal. PERRL. EOMI. Head: Atraumatic. ENT:      Ears:      Nose: No congestion/rhinnorhea.      Mouth/Throat: Mucous membranes are moist.  Neck: No stridor.   Cardiovascular: Normal rate,  regular rhythm.  Good peripheral circulation. Respiratory: Normal respiratory effort without tachypnea or retractions. Lungs CTAB. Good air entry to the bases with no decreased or absent breath sounds. Musculoskeletal: Full range of motion to all extremities. No gross deformities appreciated. Neurologic:  Normal speech and language. No gross focal neurologic deficits are appreciated.  Skin:  Skin is warm, dry. 1mm defect in skin to hairline behind left ear with 1cm surrounding induration and 2 cm surrounding erythema. No fluctuance. No drainage. Minimally tender to palpation.   Psychiatric: Mood and affect are normal. Speech and behavior are normal. Patient exhibits appropriate insight and judgement.   ____________________________________________   LABS (all labs ordered are listed, but only abnormal results are  displayed)  Labs Reviewed - No data to display ____________________________________________  EKG   ____________________________________________  RADIOLOGY   No results found.  ____________________________________________    PROCEDURES  Procedure(s) performed:    Procedures    Medications  clindamycin (CLEOCIN) injection 600 mg (600 mg Intramuscular Given 02/28/19 1503)  ibuprofen (ADVIL) tablet 600 mg (600 mg Oral Given 02/28/19 1502)     ____________________________________________   INITIAL IMPRESSION / ASSESSMENT AND PLAN / ED COURSE  Pertinent labs & imaging results that were available during my care of the patient were reviewed by me and considered in my medical decision making (see chart for details).  Review of the [place] CSRS was performed in accordance of the [facility] prior to dispensing any controlled drugs.     Patient's diagnosis is consistent with cellulitis.  Patient has a spot that looks like very well could be an insect bite.  Patient did not see an insect spider, tick.  She has some surrounding cellulitis.  No palpable abscess at this time.  Patient will be discharged home with prescriptions for clindamycin. Patient is to follow up with PCP as directed. Patient is given ED precautions to return to the ED for any worsening or new symptoms.     ____________________________________________  FINAL CLINICAL IMPRESSION(S) / ED DIAGNOSES  Final diagnoses:  Cellulitis of head except face      NEW MEDICATIONS STARTED DURING THIS VISIT:  ED Discharge Orders         Ordered    clindamycin (CLEOCIN) 300 MG capsule  4 times daily,   Status:  Discontinued     02/28/19 1527    clindamycin (CLEOCIN) 300 MG capsule  4 times daily     02/28/19 1551              This chart was dictated using voice recognition software/Dragon. Despite best efforts to proofread, errors can occur which can change the meaning. Any change was purely unintentional.     Enid DerryWagner, Zymir Napoli, PA-C 02/28/19 1602    Sharman CheekStafford, Phillip, MD 03/01/19 [phone]

## 2019-02-28 NOTE — ED Triage Notes (Signed)
Pt reports abscess behind left ear since yesterday. {t states she thinks a spider bit her.

## 2019-02-28 NOTE — Discharge Instructions (Addendum)
You have what looks like could be a bite to your head.  I do not feel any abscess that we can drain at this time.  Please use heating pads to encourage any possible abscess to surface.  Begin antibiotics for infection.  You may take 2 more doses today.  Return to the emergency department if symptoms worsen.

## 2019-02-28 NOTE — ED Notes (Signed)
Patient reports concern regarding reddened swollen area behind her left ear.  Patient states, "I think it might be a spider bite."  Patient states she noticed reddened area yesterday but it appears worse today.  Patient is in no obvious distress at this time.

## 2019-03-27 LAB — HM HIV SCREENING LAB: HM HIV Screening: NEGATIVE

## 2019-05-02 ENCOUNTER — Emergency Department
Admission: EM | Admit: 2019-05-02 | Discharge: 2019-05-02 | Disposition: A | Discharge status: Home / Self Care | Payer: Self-pay | Attending: Emergency Medicine | Admitting: Emergency Medicine

## 2019-05-02 ENCOUNTER — Other Ambulatory Visit: Payer: Self-pay

## 2019-05-02 DIAGNOSIS — H66002 Acute suppurative otitis media without spontaneous rupture of ear drum, left ear: Secondary | ICD-10-CM | POA: Insufficient documentation

## 2019-05-02 DIAGNOSIS — Z79899 Other long term (current) drug therapy: Secondary | ICD-10-CM | POA: Insufficient documentation

## 2019-05-02 DIAGNOSIS — F17210 Nicotine dependence, cigarettes, uncomplicated: Secondary | ICD-10-CM | POA: Insufficient documentation

## 2019-05-02 DIAGNOSIS — R0981 Nasal congestion: Secondary | ICD-10-CM | POA: Insufficient documentation

## 2019-05-02 MED ORDER — AMOXICILLIN 500 MG PO CAPS
1000.0000 mg | ORAL_CAPSULE | Freq: Once | ORAL | Status: AC
  Administered 2019-05-02: 1000 mg via ORAL
  Filled 2019-05-02: qty 2

## 2019-05-02 MED ORDER — CLARITIN-D 12 HOUR 5-120 MG PO TB12: 1.0000 | ORAL_TABLET | Freq: Two times a day (BID) | ORAL | 0 refills | Status: DC

## 2019-05-02 MED ORDER — AMOXICILLIN 875 MG PO TABS: 875.0000 mg | ORAL_TABLET | Freq: Two times a day (BID) | ORAL | 0 refills | Status: DC

## 2019-05-02 NOTE — Discharge Instructions (Addendum)
Please take medications as prescribed.  Return to the ER for any fevers increasing pain difficulty hearing worsening symptoms or urgent changes in your health.

## 2019-05-02 NOTE — ED Triage Notes (Signed)
Pt to the er for bilateral ear pain. Pt has a hx of ear infections. Pt states she has been dizzy with because she feels off.

## 2019-05-02 NOTE — ED Provider Notes (Signed)
[facility] Provider Note   CSN: [id number] Arrival date & time: 05/02/19  [place]     History   Chief Complaint Chief Complaint  Patient presents with  . Otalgia    HPI Briana Lyons is a 38 y.o. female presents to the emergency [facility] for evaluation of bilateral ear pain.  She describes fullness and pain in both ears is been increasing over the last week.  She has had some sinus pain pressure and congestion.  No cough, chest pain, shortness of breath.  No loss of appetite nausea, vomiting abdominal pain or diarrhea.  She denies any loss of taste or smell.  No known contacts with COVID.  Her pain is moderate.  She denies any dizziness lightheadedness.     HPI  Past Medical History:  Diagnosis Date  . Herpes   . Ovarian cyst     There are no active problems to display for this patient.   Past Surgical History:  Procedure Laterality Date  . APPENDECTOMY    . CESAREAN SECTION    . TUBAL LIGATION       OB History   No obstetric history on file.      Home Medications    Prior to Admission medications   Medication Sig Start Date End Date Taking? Authorizing Provider  amoxicillin (AMOXIL) 875 MG tablet Take 1 tablet (875 mg total) by mouth 2 (two) times daily. X 10 days 05/02/19   Duanne Guess, PA-C  baclofen (LIORESAL) 10 MG tablet Take 1 tablet (10 mg total) by mouth 3 (three) times daily. Patient not taking: Reported on 05/02/2019 11/08/18 11/08/19  Caryn Section Linden Dolin, PA-C  cyanocobalamin 1000 MCG tablet Take 1,000 mcg by mouth daily.    [provider]  loratadine-pseudoephedrine (CLARITIN-D 12 HOUR) 5-120 MG tablet Take 1 tablet by mouth 2 (two) times daily. 05/02/19 05/01/20  Duanne Guess, PA-C  meloxicam (MOBIC) 15 MG tablet Take 1 tablet (15 mg total) by mouth daily. Patient not taking: Reported on 05/02/2019 11/08/18 11/08/19  Versie Starks, PA-C    Family History No family history on file.  Social  History Social History   Tobacco Use  . Smoking status: Current Every Day Smoker    Packs/day: 0.50    Types: Cigarettes  . Smokeless tobacco: Never Used  Substance Use Topics  . Alcohol use: No  . Drug use: No     Allergies   Codeine   Review of Systems Review of Systems  Constitutional: Negative for chills, fatigue and fever.  HENT: Positive for ear pain, postnasal drip and sinus pain. Negative for drooling, ear discharge, facial swelling, hearing loss, trouble swallowing and voice change.   Respiratory: Negative for cough, choking, chest tightness and shortness of breath.   Cardiovascular: Negative for chest pain and leg swelling.  Gastrointestinal: Negative for diarrhea, nausea and vomiting.  Skin: Negative for rash and wound.  Neurological: Negative for dizziness, numbness and headaches.     Physical Exam Updated Vital Signs BP 129/90   Pulse 98   Temp 98.3 F (36.8 C) (Oral)   Resp 18   Ht 5\' 3"  (1.6 m)   Wt 77.1 kg   SpO2 98%   BMI 30.11 kg/m   Physical Exam Constitutional:      Appearance: She is well-developed.  HENT:     Head: Normocephalic and atraumatic.     Comments: Both canals normal, left greater than right TM erythema with fluid behind both  TMs with slight bulging.  TMs are intact.  No drainage.  No significant cerumen accumulation.    Nose:     Comments: Left greater than right maxillary sinus tenderness to palpation.    Mouth/Throat:     Pharynx: No oropharyngeal exudate or posterior oropharyngeal erythema.  Eyes:     Conjunctiva/sclera: Conjunctivae normal.  Neck:     Musculoskeletal: Normal range of motion.  Cardiovascular:     Rate and Rhythm: Normal rate.     Heart sounds: No murmur.  Pulmonary:     Effort: Pulmonary effort is normal. No respiratory distress.  Musculoskeletal: Normal range of motion.  Skin:    General: Skin is warm.     Findings: No rash.  Neurological:     Mental Status: She is alert and oriented to person,  place, and time.  Psychiatric:        Behavior: Behavior normal.        Thought Content: Thought content normal.      ED Treatments / Results  Labs (all labs ordered are listed, but only abnormal results are displayed) Labs Reviewed - No data to display  EKG None  Radiology No results found.  Procedures Procedures (including critical care time)  Medications Ordered in ED Medications  amoxicillin (AMOXIL) capsule 1,000 mg (has no administration in time range)     Initial Impression / Assessment and Plan / ED Course  I have reviewed the triage vital signs and the nursing notes.  Pertinent labs & imaging results that were available during my care of the patient were reviewed by me and considered in my medical decision making (see chart for details).    [name] was evaluated in Emergency [facility] on 05/02/2019 for the symptoms described in the history of present illness. She was evaluated in the context of the global COVID-19 pandemic, which necessitated consideration that the patient might be at risk for infection with the SARS-CoV-2 virus that causes COVID-19. Institutional protocols and algorithms that pertain to the evaluation of patients at risk for COVID-19 are in a state of rapid change based on information released by regulatory bodies including the CDC and federal and state organizations. These policies and algorithms were followed during the patient's care in the ED.     38 year old female with left ear otitis media.  Significant fluid noted in both middle ears.  No signs of TM ruptures.  Patient also has some sinus pressure and congestion, tenderness of the left maxillary sinus.  She is started on amoxicillin and given decongestant with antihistamine.  Vital signs are stable, afebrile.  Tolerating p.o. well.  No known contacts with COVID.  Final Clinical Impressions(s) / ED Diagnoses   Final diagnoses:  Non-recurrent acute suppurative otitis media of left  ear without spontaneous rupture of tympanic membrane  Sinus congestion    ED Discharge Orders         Ordered    amoxicillin (AMOXIL) 875 MG tablet  2 times daily     05/02/19 2054    loratadine-pseudoephedrine (CLARITIN-D 12 HOUR) 5-120 MG tablet  2 times daily     05/02/19 2054           Ronnette JuniperGaines,  C, PA-C 05/02/19 2059    Shaune PollackIsaacs, [place], MD 05/05/19 0530

## 2019-08-26 DIAGNOSIS — Z653 Problems related to other legal circumstances: Secondary | ICD-10-CM

## 2019-08-27 ENCOUNTER — Encounter: Payer: Self-pay | Admitting: Family Medicine

## 2019-08-27 ENCOUNTER — Other Ambulatory Visit: Payer: Self-pay

## 2019-08-27 ENCOUNTER — Ambulatory Visit: Payer: Self-pay | Admitting: Family Medicine

## 2019-08-27 DIAGNOSIS — Z113 Encounter for screening for infections with a predominantly sexual mode of transmission: Secondary | ICD-10-CM

## 2019-08-27 DIAGNOSIS — Z8619 Personal history of other infectious and parasitic diseases: Secondary | ICD-10-CM

## 2019-08-27 LAB — WET PREP FOR TRICH, YEAST, CLUE
Trichomonas Exam: NEGATIVE
Yeast Exam: NEGATIVE

## 2019-08-27 MED ORDER — ACYCLOVIR 400 MG PO TABS: 400.0000 mg | ORAL_TABLET | Freq: Two times a day (BID) | ORAL | 11 refills | Status: AC
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
## 2019-08-27 NOTE — Addendum Note (Signed)
Addended by: Hassell Done on: 08/27/2019 05:43 PM   Modules accepted: Orders

## 2019-08-27 NOTE — Progress Notes (Signed)
Wet mount reviewed and is negative today, no treatment needed per standing order and per Hassell Done, FNP verbal order. Provider orders completed.Ronny Bacon, RN

## 2019-08-30 LAB — HEPATITIS B SURFACE ANTIGEN

## 2019-09-01 LAB — GONOCOCCUS CULTURE

## 2019-09-02 LAB — HM HEPATITIS C SCREENING LAB: HM Hepatitis Screen: NEGATIVE

## 2019-09-02 LAB — HM HIV SCREENING LAB: HM HIV Screening: NEGATIVE

## 2019-10-21 ENCOUNTER — Other Ambulatory Visit: Payer: Self-pay

## 2019-10-21 DIAGNOSIS — F17210 Nicotine dependence, cigarettes, uncomplicated: Secondary | ICD-10-CM | POA: Insufficient documentation

## 2019-10-21 DIAGNOSIS — N10 Acute tubulo-interstitial nephritis: Secondary | ICD-10-CM | POA: Insufficient documentation

## 2019-10-21 LAB — CBC
HCT: 33.8 % — ABNORMAL LOW (ref 36.0–46.0)
Hemoglobin: 11.1 g/dL — ABNORMAL LOW (ref 12.0–15.0)
MCH: 26.7 pg (ref 26.0–34.0)
MCHC: 32.8 g/dL (ref 30.0–36.0)
MCV: 81.3 fL (ref 80.0–100.0)
Platelets: 322 10*3/uL (ref 150–400)
RBC: 4.16 MIL/uL (ref 3.87–5.11)
RDW: 14.1 % (ref 11.5–15.5)
WBC: 14.3 10*3/uL — ABNORMAL HIGH (ref 4.0–10.5)
nRBC: 0 % (ref 0.0–0.2)

## 2019-10-21 LAB — URINALYSIS, COMPLETE (UACMP) WITH MICROSCOPIC
Bilirubin Urine: NEGATIVE
Glucose, UA: NEGATIVE mg/dL
Ketones, ur: NEGATIVE mg/dL
Nitrite: POSITIVE — AB
Protein, ur: 100 mg/dL — AB
Specific Gravity, Urine: 1.002 — ABNORMAL LOW (ref 1.005–1.030)
WBC, UA: >50 WBC/hpf — ABNORMAL HIGH (ref 0–5)
pH: 6 (ref 5.0–8.0)

## 2019-10-21 LAB — COMPREHENSIVE METABOLIC PANEL
ALT: 16 U/L (ref 0–44)
AST: 19 U/L (ref 15–41)
Albumin: 3.9 g/dL (ref 3.5–5.0)
Alkaline Phosphatase: 66 U/L (ref 38–126)
Anion gap: 10 (ref 5–15)
BUN: 6 mg/dL (ref 6–20)
CO2: 21 mmol/L — ABNORMAL LOW (ref 22–32)
Calcium: 9 mg/dL (ref 8.9–10.3)
Chloride: 104 mmol/L (ref 98–111)
Creatinine, Ser: 0.79 mg/dL (ref 0.44–1.00)
GFR calc Af Amer: >60 mL/min (ref >60)
GFR calc non Af Amer: >60 mL/min (ref >60)
Glucose, Bld: 147 mg/dL — ABNORMAL HIGH (ref 70–99)
Potassium: 3.2 mmol/L — ABNORMAL LOW (ref 3.5–5.1)
Sodium: 135 mmol/L (ref 135–145)
Total Bilirubin: 0.7 mg/dL (ref 0.3–1.2)
Total Protein: 7 g/dL (ref 6.5–8.1)

## 2019-10-21 LAB — POCT PREGNANCY, URINE: Preg Test, Ur: NEGATIVE

## 2019-10-21 LAB — LIPASE, BLOOD: Lipase: 27 U/L (ref 11–51)

## 2019-10-21 LAB — LACTIC ACID, PLASMA: Lactic Acid, Venous: 1.9 mmol/L (ref 0.5–1.9)

## 2019-10-21 MED ORDER — ONDANSETRON 4 MG PO TBDP: 4.0000 mg | ORAL_TABLET | Freq: Once | ORAL | Status: AC | PRN

## 2019-10-21 MED ORDER — ONDANSETRON 4 MG PO TBDP
ORAL_TABLET | ORAL | Status: AC
  Administered 2019-10-21: 22:00:00 4 mg via ORAL
  Filled 2019-10-21: qty 1

## 2019-10-21 MED ORDER — FENTANYL CITRATE (PF) 100 MCG/2ML IJ SOLN
50.0000 ug | INTRAMUSCULAR | Status: DC | PRN
  Administered 2019-10-21: 22:00:00 50 ug via NASAL
  Filled 2019-10-21: qty 2

## 2019-10-21 NOTE — ED Notes (Signed)
Pt sitting in lobby with no distress noted; updated on wait time and vs retaken; ice chips give to pt at request

## 2019-10-21 NOTE — ED Triage Notes (Signed)
Patient c/o lower abdominal pain, pain with urination, difficulty initiating urinary stream. Patient reports urinating immediately prior to coming to this ED.

## 2019-10-22 ENCOUNTER — Emergency Department
Admission: EM | Admit: 2019-10-22 | Discharge: 2019-10-22 | Disposition: A | Discharge status: Home / Self Care | Payer: Self-pay | Attending: Emergency Medicine | Admitting: Emergency Medicine

## 2019-10-22 ENCOUNTER — Encounter: Payer: Self-pay | Admitting: Radiology

## 2019-10-22 ENCOUNTER — Emergency Department: Payer: Self-pay

## 2019-10-22 DIAGNOSIS — N12 Tubulo-interstitial nephritis, not specified as acute or chronic: Secondary | ICD-10-CM

## 2019-10-22 LAB — PROCALCITONIN: Procalcitonin: 0.2 ng/mL

## 2019-10-22 MED ORDER — IOHEXOL 300 MG/ML  SOLN
100.0000 mL | Freq: Once | INTRAMUSCULAR | Status: AC | PRN
  Administered 2019-10-22: 02:00:00 100 mL via INTRAVENOUS

## 2019-10-22 MED ORDER — PHENAZOPYRIDINE HCL 95 MG PO TABS: 190.0000 mg | ORAL_TABLET | Freq: Three times a day (TID) | ORAL | 0 refills | Status: DC | PRN

## 2019-10-22 MED ORDER — SODIUM CHLORIDE 0.9 % IV BOLUS
1000.0000 mL | Freq: Once | INTRAVENOUS | Status: AC
  Administered 2019-10-22: 1000 mL via INTRAVENOUS

## 2019-10-22 MED ORDER — ONDANSETRON HCL 4 MG/2ML IJ SOLN
4.0000 mg | INTRAMUSCULAR | Status: AC
  Administered 2019-10-22: 4 mg via INTRAVENOUS
  Filled 2019-10-22: qty 2

## 2019-10-22 MED ORDER — SODIUM CHLORIDE 0.9 % IV SOLN
1.0000 g | INTRAVENOUS | Status: DC
  Administered 2019-10-22: 1 g via INTRAVENOUS

## 2019-10-22 MED ORDER — CEPHALEXIN 500 MG PO CAPS: 500.0000 mg | ORAL_CAPSULE | Freq: Four times a day (QID) | ORAL | 0 refills | Status: AC

## 2019-10-22 MED ORDER — HYDROCODONE-ACETAMINOPHEN 5-325 MG PO TABS: 2.0000 | ORAL_TABLET | Freq: Four times a day (QID) | ORAL | 0 refills | Status: DC | PRN

## 2019-10-22 MED ORDER — KETOROLAC TROMETHAMINE 30 MG/ML IJ SOLN
15.0000 mg | Freq: Once | INTRAMUSCULAR | Status: AC
  Administered 2019-10-22: 15 mg via INTRAVENOUS
  Filled 2019-10-22: qty 1

## 2019-10-22 MED ORDER — ONDANSETRON 4 MG PO TBDP: ORAL_TABLET | ORAL | 0 refills | Status: DC

## 2019-10-22 NOTE — Discharge Instructions (Addendum)
Your workup today suggests that you have a urinary tract infection (UTI) which has spread to your kidneys.  Please take your antibiotic as prescribed and over-the-counter pain medication (Tylenol or Motrin) as needed, but no more than recommended on the label instructions.  It is VERY important that you take the full 2-week course of antibiotics, and that you take it four times a day.  DO NOT STOP TAKING IT EVEN AFTER YOU FEEL BETTER!!!  Drink PLENTY of fluids.    Take Norco as prescribed ONLY for severe pain. Do not drink alcohol, drive or participate in any other potentially dangerous activities while taking this medication as it may make you sleepy. Do not take this medication with any other sedating medications, either prescription or over-the-counter. If you were prescribed Percocet or Vicodin, do not take these with acetaminophen (Tylenol) as it is already contained within these medications.   This medication is an opiate (or narcotic) pain medication and can be habit forming.  Use it as little as possible to achieve adequate pain control.  Do not use or use it with extreme caution if you have a history of opiate abuse or dependence.  If you are on a pain contract with your primary care doctor or a pain specialist, be sure to let them know you were prescribed this medication today from the [facility] Emergency Department.  This medication is intended for your use only - do not give any to anyone else and keep it in a secure place where nobody else, especially children, have access to it.  It will also cause or worsen constipation, so you may want to consider taking an over-the-counter stool softener while you are taking this medication.  Call your regular doctor to schedule the next available appointment to follow up on today's ED visit, or return immediately to the ED if your pain worsens, you have decreased urine production, develop fever, persistent vomiting, or other symptoms that concern  you.
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
## 2019-10-23 LAB — URINE CULTURE: Culture: >=100000 — AB

## 2019-12-28 ENCOUNTER — Other Ambulatory Visit: Payer: Self-pay

## 2019-12-28 ENCOUNTER — Emergency Department: Payer: Self-pay

## 2019-12-28 ENCOUNTER — Emergency Department
Admission: EM | Admit: 2019-12-28 | Discharge: 2019-12-28 | Disposition: A | Discharge status: Home / Self Care | Payer: Self-pay | Attending: Student in an Organized Health Care Education/Training Program | Admitting: Student in an Organized Health Care Education/Training Program

## 2019-12-28 ENCOUNTER — Encounter: Payer: Self-pay | Admitting: Emergency Medicine

## 2019-12-28 DIAGNOSIS — Y939 Activity, unspecified: Secondary | ICD-10-CM | POA: Insufficient documentation

## 2019-12-28 DIAGNOSIS — S0083XA Contusion of other part of head, initial encounter: Secondary | ICD-10-CM | POA: Insufficient documentation

## 2019-12-28 DIAGNOSIS — Y999 Unspecified external cause status: Secondary | ICD-10-CM | POA: Insufficient documentation

## 2019-12-28 DIAGNOSIS — S51851A Open bite of right forearm, initial encounter: Secondary | ICD-10-CM | POA: Insufficient documentation

## 2019-12-28 DIAGNOSIS — M791 Myalgia, unspecified site: Secondary | ICD-10-CM | POA: Insufficient documentation

## 2019-12-28 DIAGNOSIS — Z79899 Other long term (current) drug therapy: Secondary | ICD-10-CM | POA: Insufficient documentation

## 2019-12-28 DIAGNOSIS — M7918 Myalgia, other site: Secondary | ICD-10-CM

## 2019-12-28 DIAGNOSIS — Y929 Unspecified place or not applicable: Secondary | ICD-10-CM | POA: Insufficient documentation

## 2019-12-28 DIAGNOSIS — F17210 Nicotine dependence, cigarettes, uncomplicated: Secondary | ICD-10-CM | POA: Insufficient documentation

## 2019-12-28 LAB — URINE DRUG SCREEN, QUALITATIVE (ARMC ONLY)
Amphetamines, Ur Screen: POSITIVE — AB
Barbiturates, Ur Screen: NOT DETECTED
Benzodiazepine, Ur Scrn: NOT DETECTED
Cannabinoid 50 Ng, Ur ~~LOC~~: POSITIVE — AB
Cocaine Metabolite,Ur ~~LOC~~: POSITIVE — AB
MDMA (Ecstasy)Ur Screen: NOT DETECTED
Methadone Scn, Ur: NOT DETECTED
Opiate, Ur Screen: NOT DETECTED
Phencyclidine (PCP) Ur S: NOT DETECTED
Tricyclic, Ur Screen: NOT DETECTED

## 2019-12-28 LAB — PREGNANCY, URINE: Preg Test, Ur: NEGATIVE

## 2019-12-28 LAB — POCT PREGNANCY, URINE: Preg Test, Ur: NEGATIVE

## 2019-12-28 MED ORDER — IBUPROFEN 600 MG PO TABS
600.0000 mg | ORAL_TABLET | Freq: Once | ORAL | Status: AC
  Administered 2019-12-28: 600 mg via ORAL
  Filled 2019-12-28: qty 1

## 2019-12-28 MED ORDER — OXYCODONE-ACETAMINOPHEN 7.5-325 MG PO TABS: 1.0000 | ORAL_TABLET | Freq: Four times a day (QID) | ORAL | 0 refills | Status: AC | PRN

## 2019-12-28 MED ORDER — CYCLOBENZAPRINE HCL 10 MG PO TABS
10.0000 mg | ORAL_TABLET | Freq: Once | ORAL | Status: AC
  Administered 2019-12-28: 10 mg via ORAL
  Filled 2019-12-28: qty 1

## 2019-12-28 MED ORDER — IBUPROFEN 600 MG PO TABS: 600.0000 mg | ORAL_TABLET | Freq: Three times a day (TID) | ORAL | 0 refills | Status: DC | PRN

## 2019-12-28 MED ORDER — NEOSPORIN PLUS PAIN RELIEF MS 3.5-10000-10 EX CREA: TOPICAL_CREAM | Freq: Two times a day (BID) | CUTANEOUS | 0 refills | Status: DC

## 2019-12-28 MED ORDER — AMOXICILLIN-POT CLAVULANATE 875-125 MG PO TABS: 1.0000 | ORAL_TABLET | Freq: Two times a day (BID) | ORAL | 0 refills | Status: DC

## 2019-12-28 MED ORDER — BACITRACIN-NEOMYCIN-POLYMYXIN 400-5-5000 EX OINT
TOPICAL_OINTMENT | Freq: Once | CUTANEOUS | Status: AC
  Administered 2019-12-28: 1 via TOPICAL
  Filled 2019-12-28: qty 1

## 2019-12-28 MED ORDER — CYCLOBENZAPRINE HCL 10 MG PO TABS: 10.0000 mg | ORAL_TABLET | Freq: Three times a day (TID) | ORAL | 0 refills | Status: DC | PRN
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
## 2019-12-28 NOTE — ED Notes (Signed)
SANE nurse in room with pt. Pt reminded about urine sample after SANE nurse is done.
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
## 2019-12-28 NOTE — ED Notes (Signed)
Pt states she did call friend and that friend is coming to pick her up. Pt asking to wait in waiting room. Pt asked if she feels safe waiting there, pt states she feels safe. Pt states her landlord is picking her up and she will go to friend [employer]
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
## 2019-12-28 NOTE — ED Notes (Signed)
[facility] officer in room with pt.
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
## 2019-12-28 NOTE — Consult Note (Signed)
The SANE/FNE Teacher, music) consult has been completed. The primary RN and PA have been notified. Please contact the SANE/FNE nurse on call (listed in Amion) with any further concerns.

## 2019-12-28 NOTE — ED Notes (Signed)
Pt unable to give urine sample at this time, pt will try again in a few minutes.

## 2019-12-28 NOTE — ED Notes (Signed)
Social worker Systems developer called and notifed RN that she will see patient about 200 pm for information r/t safe place to stay
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
## 2019-12-28 NOTE — ED Notes (Signed)
Per Vikki Ports, RN social work states they will call back in a few minutes with resource options for pt.
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
## 2019-12-28 NOTE — ED Notes (Signed)
Pt appearing anxious, walking out to nurses station, pt asked to return to room to wait for [place]
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
## 2020-01-31 ENCOUNTER — Other Ambulatory Visit: Payer: Self-pay

## 2020-01-31 ENCOUNTER — Encounter: Payer: Self-pay | Admitting: Emergency Medicine

## 2020-01-31 DIAGNOSIS — Z5321 Procedure and treatment not carried out due to patient leaving prior to being seen by health care provider: Secondary | ICD-10-CM | POA: Insufficient documentation

## 2020-01-31 DIAGNOSIS — R109 Unspecified abdominal pain: Secondary | ICD-10-CM | POA: Insufficient documentation

## 2020-01-31 LAB — COMPREHENSIVE METABOLIC PANEL
ALT: 13 U/L (ref 0–44)
AST: 17 U/L (ref 15–41)
Albumin: 4.2 g/dL (ref 3.5–5.0)
Alkaline Phosphatase: 53 U/L (ref 38–126)
Anion gap: 10 (ref 5–15)
BUN: 8 mg/dL (ref 6–20)
CO2: 24 mmol/L (ref 22–32)
Calcium: 9.1 mg/dL (ref 8.9–10.3)
Chloride: 104 mmol/L (ref 98–111)
Creatinine, Ser: 0.87 mg/dL (ref 0.44–1.00)
GFR calc Af Amer: >60 mL/min (ref >60)
GFR calc non Af Amer: >60 mL/min (ref >60)
Glucose, Bld: 112 mg/dL — ABNORMAL HIGH (ref 70–99)
Potassium: 3.3 mmol/L — ABNORMAL LOW (ref 3.5–5.1)
Sodium: 138 mmol/L (ref 135–145)
Total Bilirubin: 0.4 mg/dL (ref 0.3–1.2)
Total Protein: 7.1 g/dL (ref 6.5–8.1)

## 2020-01-31 LAB — URINALYSIS, COMPLETE (UACMP) WITH MICROSCOPIC
Bacteria, UA: NONE SEEN
Bilirubin Urine: NEGATIVE
Glucose, UA: NEGATIVE mg/dL
Hgb urine dipstick: NEGATIVE
Ketones, ur: NEGATIVE mg/dL
Leukocytes,Ua: NEGATIVE
Nitrite: NEGATIVE
Protein, ur: NEGATIVE mg/dL
Specific Gravity, Urine: 1.004 — ABNORMAL LOW (ref 1.005–1.030)
pH: 5 (ref 5.0–8.0)

## 2020-01-31 LAB — CBC
HCT: 36.5 % (ref 36.0–46.0)
Hemoglobin: 11.7 g/dL — ABNORMAL LOW (ref 12.0–15.0)
MCH: 27.5 pg (ref 26.0–34.0)
MCHC: 32.1 g/dL (ref 30.0–36.0)
MCV: 85.7 fL (ref 80.0–100.0)
Platelets: 455 10*3/uL — ABNORMAL HIGH (ref 150–400)
RBC: 4.26 MIL/uL (ref 3.87–5.11)
RDW: 15 % (ref 11.5–15.5)
WBC: 9.7 10*3/uL (ref 4.0–10.5)
nRBC: 0 % (ref 0.0–0.2)

## 2020-01-31 LAB — LIPASE, BLOOD: Lipase: 103 U/L — ABNORMAL HIGH (ref 11–51)

## 2020-01-31 MED ORDER — SODIUM CHLORIDE 0.9% FLUSH: 3.0000 mL | Freq: Once | INTRAVENOUS | Status: DC

## 2020-01-31 NOTE — ED Triage Notes (Signed)
Pt to ED via POV c/o nausea and being "bloated". Pt states that she feels like the "infection is going up in my body". Pt states that she has not been diagnosed with an infection but knows she has one because nothing is right. Pt is in NAD.

## 2020-02-01 ENCOUNTER — Emergency Department
Admission: EM | Admit: 2020-02-01 | Discharge: 2020-02-01 | Disposition: A | Discharge status: Home / Self Care | Payer: Self-pay | Attending: Emergency Medicine | Admitting: Emergency Medicine

## 2020-02-03 ENCOUNTER — Telehealth: Payer: Self-pay | Admitting: Emergency Medicine
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
## 2020-02-06 ENCOUNTER — Other Ambulatory Visit: Payer: Self-pay

## 2020-02-06 ENCOUNTER — Ambulatory Visit: Payer: Self-pay | Admitting: Physician Assistant

## 2020-02-06 ENCOUNTER — Encounter: Payer: Self-pay | Admitting: Physician Assistant

## 2020-02-06 DIAGNOSIS — Z113 Encounter for screening for infections with a predominantly sexual mode of transmission: Secondary | ICD-10-CM

## 2020-02-06 DIAGNOSIS — B373 Candidiasis of vulva and vagina: Secondary | ICD-10-CM

## 2020-02-06 DIAGNOSIS — B3731 Acute candidiasis of vulva and vagina: Secondary | ICD-10-CM

## 2020-02-06 LAB — WET PREP FOR TRICH, YEAST, CLUE
Trichomonas Exam: NEGATIVE
Yeast Exam: NEGATIVE

## 2020-02-06 MED ORDER — CLOTRIMAZOLE 1 % VA CREA: 1.0000 | TOPICAL_CREAM | Freq: Every day | VAGINAL | 0 refills | Status: AC
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
## 2020-02-11 LAB — GONOCOCCUS CULTURE

## 2020-02-20 ENCOUNTER — Telehealth: Payer: Self-pay | Admitting: Family Medicine

## 2020-02-20 NOTE — Telephone Encounter (Signed)
TC to patient. Verified ID via password/SS#. Informed of neg GC/Chlamydia Richmond Campbell, RN

## 2020-02-20 NOTE — Telephone Encounter (Signed)
WANTS TEST RESULTS 

## 2020-03-20 ENCOUNTER — Other Ambulatory Visit: Payer: Self-pay

## 2020-03-20 ENCOUNTER — Encounter: Payer: Self-pay | Admitting: Advanced Practice Midwife

## 2020-03-20 ENCOUNTER — Ambulatory Visit: Payer: Self-pay | Admitting: Advanced Practice Midwife

## 2020-03-20 DIAGNOSIS — T7421XA Adult sexual abuse, confirmed, initial encounter: Secondary | ICD-10-CM | POA: Insufficient documentation

## 2020-03-20 DIAGNOSIS — T7411XS Adult physical abuse, confirmed, sequela: Secondary | ICD-10-CM

## 2020-03-20 DIAGNOSIS — A64 Unspecified sexually transmitted disease: Secondary | ICD-10-CM

## 2020-03-20 DIAGNOSIS — T7421XS Adult sexual abuse, confirmed, sequela: Secondary | ICD-10-CM

## 2020-03-20 DIAGNOSIS — Z9851 Tubal ligation status: Secondary | ICD-10-CM

## 2020-03-20 DIAGNOSIS — F17200 Nicotine dependence, unspecified, uncomplicated: Secondary | ICD-10-CM | POA: Insufficient documentation

## 2020-03-20 DIAGNOSIS — T7411XA Adult physical abuse, confirmed, initial encounter: Secondary | ICD-10-CM | POA: Insufficient documentation

## 2020-03-20 LAB — WET PREP FOR TRICH, YEAST, CLUE
Trichomonas Exam: NEGATIVE
Yeast Exam: NEGATIVE

## 2020-03-20 MED ORDER — CEFTRIAXONE SODIUM 250 MG IJ SOLR
500.0000 mg | Freq: Once | INTRAMUSCULAR | Status: AC
  Administered 2020-03-20: 500 mg via INTRAMUSCULAR

## 2020-03-20 MED ORDER — AZITHROMYCIN 500 MG PO TABS
1000.0000 mg | ORAL_TABLET | Freq: Once | ORAL | Status: AC
  Administered 2020-03-20: 1000 mg via ORAL

## 2020-03-20 MED ORDER — METRONIDAZOLE 500 MG PO TABS: 500.0000 mg | ORAL_TABLET | Freq: Two times a day (BID) | ORAL | 0 refills | Status: AC
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
## 2020-03-26 LAB — GONOCOCCUS CULTURE

## 2020-04-03 ENCOUNTER — Telehealth: Payer: Self-pay | Admitting: Physician Assistant

## 2020-04-03 ENCOUNTER — Ambulatory Visit: Payer: Self-pay | Admitting: Physician Assistant

## 2020-04-03 DIAGNOSIS — N760 Acute vaginitis: Secondary | ICD-10-CM

## 2020-04-03 MED ORDER — METRONIDAZOLE 500 MG PO TABS: 500.0000 mg | ORAL_TABLET | Freq: Two times a day (BID) | ORAL | 0 refills | Status: DC
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
## 2020-04-06 ENCOUNTER — Other Ambulatory Visit: Payer: Self-pay

## 2020-04-06 MED ORDER — METRONIDAZOLE 500 MG PO TABS: 500.0000 mg | ORAL_TABLET | Freq: Two times a day (BID) | ORAL | 0 refills | Status: AC
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
## 2020-04-23 ENCOUNTER — Emergency Department
Admission: EM | Admit: 2020-04-23 | Discharge: 2020-04-23 | Disposition: A | Discharge status: Home / Self Care | Payer: Self-pay | Attending: Emergency Medicine | Admitting: Emergency Medicine

## 2020-04-23 ENCOUNTER — Other Ambulatory Visit: Payer: Self-pay

## 2020-04-23 ENCOUNTER — Emergency Department: Payer: Self-pay

## 2020-04-23 ENCOUNTER — Encounter: Payer: Self-pay | Admitting: Emergency Medicine

## 2020-04-23 DIAGNOSIS — K0889 Other specified disorders of teeth and supporting structures: Secondary | ICD-10-CM | POA: Insufficient documentation

## 2020-04-23 DIAGNOSIS — F17210 Nicotine dependence, cigarettes, uncomplicated: Secondary | ICD-10-CM | POA: Insufficient documentation

## 2020-04-23 MED ORDER — AMOXICILLIN 875 MG PO TABS: 875.0000 mg | ORAL_TABLET | Freq: Two times a day (BID) | ORAL | 0 refills | Status: AC
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
## 2020-04-23 NOTE — Discharge Instructions (Signed)
OPTIONS FOR DENTAL FOLLOW UP CARE ° °[place] Department of Health and Human Services - Local Safety Net Dental Clinics °http://www.ncdhhs.gov/dph/oralhealth/services/safetynetclinics.htm °  °[facility] (336-562-3123) ° °[facility] ([phone]) ° °[facility] ([phone] ext [phone]) ° °[facility] ([phone]) ° °[facility] ([phone]) °This clinic caters to the indigent population and is on a lottery system. °Location: °[facility], [address], [place] °Clinic Hours: °Wednesdays from 6pm - 9pm, patients seen by a lottery system. °For dates, call or go to www.med.[facility].edu/[facility]/patients/Dental-[facility] °Services: °Cleanings, fillings and simple extractions. °Payment Options: °DENTAL WORK IS FREE OF CHARGE. Bring proof of income or support. °Best way to get seen: °Arrive at 5:15 pm - this is a lottery, NOT first come/first serve, so arriving earlier will not increase your chances of being seen. °  °  °[facility] °[phone] °Select option 1 for emergencies °  °Location: °[facility], [address], [place] °Clinic Hours: °No walk-ins accepted - call the day before to schedule an appointment. °Check in times are 9:30 am and 1:30 pm. °Services: °Simple extractions, temporary fillings, pulpectomy/pulp debridement, uncomplicated abscess drainage. °Payment Options: °PAYMENT IS DUE AT THE TIME OF SERVICE.  Fee is usually $100-200, additional surgical procedures (e.g. abscess drainage) may be extra. °Cash, checks, Visa/MasterCard accepted.  Can file Medicaid if patient is covered for dental - patient should call case worker to check. °No discount for [facility] patients. °Best way to get seen: °MUST call the day before and get onto the schedule. Can usually be seen the next 1-2 days. No walk-ins accepted. °  °  °[facility] °[phone] °   °Location: °[facility], [address], [place] °Clinic Hours: °M, W, Th, F 8am or 1:30pm, Tues 9a or 1:30 - first come/first served. °Services: °Simple extractions, temporary fillings, uncomplicated abscess drainage.  You do not need to be an [place] resident. °Payment Options: °PAYMENT IS DUE AT THE TIME OF SERVICE. °Dental insurance, otherwise sliding scale - bring proof of income or support. °Depending on income and treatment needed, cost is usually $50-200. °Best way to get seen: °Arrive early as it is first come/first served. °  °  °[facility] °[phone] °  °Location: °[address] °Clinic Hours: °Mon-Thu 8a-5p °Services: °Most basic dental services including extractions and fillings. °Payment Options: °PAYMENT IS DUE AT THE TIME OF SERVICE. °Sliding scale, up to 50% off - bring proof if income or support. °Medicaid with dental option accepted. °Best way to get seen: °Call to schedule an appointment, can usually be seen within 2 weeks OR they will try to see walk-ins - show up at 8a or 2p (you may have to wait). °  °  °[facility] °919-245-2435 °[place] RESIDENTS ONLY °  °Location: °[facility], [address], [place], [place] [address] °Clinic Hours: By appointment only. °Monday - Thursday 8am-5pm, Friday 8am-12pm °Services: Cleanings, fillings, extractions. °Payment Options: °PAYMENT IS DUE AT THE TIME OF SERVICE. °Cash, Visa or MasterCard. Sliding scale - $30 minimum per service. °Best way to get seen: °Come in to office, complete packet and make an appointment - need proof of income °or support monies for each household member and proof of [place] residence. °Usually takes about a month to get in. °  °  °[facility] °[phone] °  °Location: °[address]   [place] °Clinic Hours: Walk-in Urgent Care Dental Services are offered Monday-Friday  mornings only. °The numbers of emergencies accepted daily is limited to the number of °providers available. °Maximum 15 - Mondays, Wednesdays & Thursdays °Maximum 10 - Tuesdays & Fridays °Services: °You do not need to be a [place] resident to be seen for a dental emergency. °Emergencies are defined as pain, swelling, abnormal bleeding, or dental trauma. Walkins will receive x-rays if needed. °NOTE: Dental cleaning is not an emergency. °Payment Options: °PAYMENT IS DUE AT THE TIME OF SERVICE. °Minimum co-pay is $40.00 for uninsured patients. °Minimum co-pay is $3.00 for Medicaid with dental coverage. °Dental Insurance is accepted and must be presented at time of visit. °Medicare does not cover dental. °Forms of payment: Cash, credit card, checks. °Best way to get seen: °If not previously registered with the clinic, walk-in dental registration begins at 7:15 am and is on a first come/first serve basis. °If previously registered with the clinic, call to make an appointment. °  °  °[facility] °[phone] °[place] RESIDENTS ONLY °  °Location: °[address], [place], [place] °Clinic Hours: °Mon-Thu 10a-2p °Services: Extractions only! °Payment Options: °FREE (donations accepted) - bring proof of income or support °Best way to get seen: °Call and schedule an appointment OR come at 8am on the 1st Monday of every month (except for holidays) when it is first come/first served. °  °  °[facility] °[phone] °  °Location: °[address], [place] °Clinic Hours: °Friday mornings °Services, Payment Options, Best way to get seen: °Call for info °
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
## 2020-04-30 ENCOUNTER — Other Ambulatory Visit: Payer: Self-pay

## 2020-04-30 ENCOUNTER — Emergency Department
Admission: EM | Admit: 2020-04-30 | Discharge: 2020-04-30 | Disposition: A | Discharge status: Home / Self Care | Payer: Self-pay | Attending: Emergency Medicine | Admitting: Emergency Medicine

## 2020-04-30 ENCOUNTER — Encounter: Payer: Self-pay | Admitting: Emergency Medicine

## 2020-04-30 DIAGNOSIS — N3090 Cystitis, unspecified without hematuria: Secondary | ICD-10-CM | POA: Insufficient documentation

## 2020-04-30 DIAGNOSIS — F17210 Nicotine dependence, cigarettes, uncomplicated: Secondary | ICD-10-CM | POA: Insufficient documentation

## 2020-04-30 DIAGNOSIS — R300 Dysuria: Secondary | ICD-10-CM | POA: Insufficient documentation

## 2020-04-30 DIAGNOSIS — Z3202 Encounter for pregnancy test, result negative: Secondary | ICD-10-CM | POA: Insufficient documentation

## 2020-04-30 LAB — CBC
HCT: 36.1 % (ref 36.0–46.0)
Hemoglobin: 12.1 g/dL (ref 12.0–15.0)
MCH: 28.1 pg (ref 26.0–34.0)
MCHC: 33.5 g/dL (ref 30.0–36.0)
MCV: 84 fL (ref 80.0–100.0)
Platelets: 411 10*3/uL — ABNORMAL HIGH (ref 150–400)
RBC: 4.3 MIL/uL (ref 3.87–5.11)
RDW: 14.2 % (ref 11.5–15.5)
WBC: 16.3 10*3/uL — ABNORMAL HIGH (ref 4.0–10.5)
nRBC: 0 % (ref 0.0–0.2)

## 2020-04-30 LAB — COMPREHENSIVE METABOLIC PANEL
ALT: 13 U/L (ref 0–44)
AST: 15 U/L (ref 15–41)
Albumin: 4.2 g/dL (ref 3.5–5.0)
Alkaline Phosphatase: 57 U/L (ref 38–126)
Anion gap: 8 (ref 5–15)
BUN: 12 mg/dL (ref 6–20)
CO2: 27 mmol/L (ref 22–32)
Calcium: 9.2 mg/dL (ref 8.9–10.3)
Chloride: 103 mmol/L (ref 98–111)
Creatinine, Ser: 0.91 mg/dL (ref 0.44–1.00)
GFR calc Af Amer: >60 mL/min (ref >60)
GFR calc non Af Amer: >60 mL/min (ref >60)
Glucose, Bld: 113 mg/dL — ABNORMAL HIGH (ref 70–99)
Potassium: 4 mmol/L (ref 3.5–5.1)
Sodium: 138 mmol/L (ref 135–145)
Total Bilirubin: 0.4 mg/dL (ref 0.3–1.2)
Total Protein: 7.3 g/dL (ref 6.5–8.1)

## 2020-04-30 LAB — URINALYSIS, COMPLETE (UACMP) WITH MICROSCOPIC
Bilirubin Urine: NEGATIVE
Glucose, UA: NEGATIVE mg/dL
Ketones, ur: NEGATIVE mg/dL
Nitrite: NEGATIVE
Protein, ur: NEGATIVE mg/dL
Specific Gravity, Urine: 1.012 (ref 1.005–1.030)
WBC, UA: >50 WBC/hpf — ABNORMAL HIGH (ref 0–5)
pH: 6 (ref 5.0–8.0)

## 2020-04-30 LAB — LIPASE, BLOOD: Lipase: 36 U/L (ref 11–51)

## 2020-04-30 LAB — PREGNANCY, URINE: Preg Test, Ur: NEGATIVE

## 2020-04-30 MED ORDER — SULFAMETHOXAZOLE-TRIMETHOPRIM 800-160 MG PO TABS: 1.0000 | ORAL_TABLET | Freq: Two times a day (BID) | ORAL | 0 refills | Status: AC

## 2020-04-30 MED ORDER — FLUCONAZOLE 100 MG PO TABS: 200.0000 mg | ORAL_TABLET | Freq: Once | ORAL | 0 refills | Status: AC
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
## 2020-04-30 NOTE — ED Notes (Signed)
Pt updated in WR. Pt reports walking outside and will return to allow for VS to be reassessed.

## 2020-04-30 NOTE — ED Notes (Signed)
E-signature not working at this time. Pt verbalized understanding of D/C instructions, prescriptions and follow up care with no further questions at this time. Pt in NAD and ambulatory at time of D/C.  

## 2020-04-30 NOTE — ED Triage Notes (Signed)
Patient states she has had lower abdominal pain and dysuria for several days. States her period ended 2 weeks ago but yesterday she started having vaginal bleeding. States she had her tubes tied but is scared she may be pregnant d/t history of bleeding in previous pregnancies.

## 2020-05-02 LAB — URINE CULTURE: Culture: <10000 — AB

## 2020-06-13 ENCOUNTER — Emergency Department
Admission: EM | Admit: 2020-06-13 | Discharge: 2020-06-13 | Disposition: A | Discharge status: Home / Self Care | Payer: Self-pay | Attending: Emergency Medicine | Admitting: Emergency Medicine

## 2020-06-13 DIAGNOSIS — K5900 Constipation, unspecified: Secondary | ICD-10-CM | POA: Insufficient documentation

## 2020-06-13 DIAGNOSIS — Z5321 Procedure and treatment not carried out due to patient leaving prior to being seen by health care provider: Secondary | ICD-10-CM | POA: Insufficient documentation

## 2020-06-13 NOTE — ED Notes (Signed)
Pt LWBS 

## 2020-06-16 ENCOUNTER — Emergency Department
Admission: EM | Admit: 2020-06-16 | Discharge: 2020-06-16 | Disposition: A | Discharge status: Home / Self Care | Payer: Self-pay | Attending: Emergency Medicine | Admitting: Emergency Medicine

## 2020-06-16 ENCOUNTER — Encounter: Payer: Self-pay | Admitting: Emergency Medicine

## 2020-06-16 ENCOUNTER — Other Ambulatory Visit: Payer: Self-pay

## 2020-06-16 DIAGNOSIS — Z79899 Other long term (current) drug therapy: Secondary | ICD-10-CM | POA: Insufficient documentation

## 2020-06-16 DIAGNOSIS — Z20822 Contact with and (suspected) exposure to covid-19: Secondary | ICD-10-CM | POA: Insufficient documentation

## 2020-06-16 DIAGNOSIS — R300 Dysuria: Secondary | ICD-10-CM | POA: Insufficient documentation

## 2020-06-16 DIAGNOSIS — R350 Frequency of micturition: Secondary | ICD-10-CM | POA: Insufficient documentation

## 2020-06-16 DIAGNOSIS — F17210 Nicotine dependence, cigarettes, uncomplicated: Secondary | ICD-10-CM | POA: Insufficient documentation

## 2020-06-16 LAB — CHLAMYDIA/NGC RT PCR (ARMC ONLY)
Chlamydia Tr: NOT DETECTED
N gonorrhoeae: NOT DETECTED

## 2020-06-16 LAB — WET PREP, GENITAL
Clue Cells Wet Prep HPF POC: NONE SEEN
Sperm: NONE SEEN
Trich, Wet Prep: NONE SEEN
Yeast Wet Prep HPF POC: NONE SEEN

## 2020-06-16 LAB — SARS CORONAVIRUS 2 BY RT PCR (HOSPITAL ORDER, PERFORMED IN ~~LOC~~ HOSPITAL LAB): SARS Coronavirus 2: NEGATIVE

## 2020-06-16 LAB — URINALYSIS, COMPLETE (UACMP) WITH MICROSCOPIC
Bacteria, UA: NONE SEEN
Bilirubin Urine: NEGATIVE
Glucose, UA: NEGATIVE mg/dL
Hgb urine dipstick: NEGATIVE
Ketones, ur: NEGATIVE mg/dL
Leukocytes,Ua: NEGATIVE
Nitrite: NEGATIVE
Protein, ur: NEGATIVE mg/dL
Specific Gravity, Urine: 1.003 — ABNORMAL LOW (ref 1.005–1.030)
pH: 6 (ref 5.0–8.0)

## 2020-06-16 NOTE — ED Triage Notes (Signed)
Pt reports dysuria, urinary frequency and urgency for the past week. Pt reports feels like a UTI, hx of the same

## 2020-06-16 NOTE — Discharge Instructions (Addendum)
Follow-up with your regular doctor or [facility] if not improving in 3 to 4 days.  Return emergency department if worsening.  Your Covid and STD testing will result later in the day.  I will call you with your results.  If your test is positive you will need to quarantine for 10 to 14 days. I ordered a urine culture on your urine to see if it grows out any bacteria.  If so they will call you and prescribe you an antibiotic.
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
## 2020-06-18 LAB — URINE CULTURE: Culture: <10000 — AB

## 2020-08-05 ENCOUNTER — Other Ambulatory Visit: Payer: Self-pay

## 2020-08-05 ENCOUNTER — Emergency Department
Admission: EM | Admit: 2020-08-05 | Discharge: 2020-08-05 | Disposition: A | Discharge status: Home / Self Care | Payer: Self-pay | Attending: Emergency Medicine | Admitting: Emergency Medicine

## 2020-08-05 ENCOUNTER — Other Ambulatory Visit: Payer: Self-pay | Admitting: Emergency Medicine

## 2020-08-05 DIAGNOSIS — F17210 Nicotine dependence, cigarettes, uncomplicated: Secondary | ICD-10-CM | POA: Insufficient documentation

## 2020-08-05 DIAGNOSIS — K029 Dental caries, unspecified: Secondary | ICD-10-CM | POA: Insufficient documentation

## 2020-08-05 MED ORDER — AMOXICILLIN 875 MG PO TABS: 875.0000 mg | ORAL_TABLET | Freq: Two times a day (BID) | ORAL | 0 refills | Status: DC
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
## 2020-08-05 NOTE — Discharge Instructions (Signed)
Begin taking antibiotics as directed.  Complete the entire course of antibiotics.  You may take Tylenol or ibuprofen as needed for pain.  Also call the dental clinic for continued dental care.  OPTIONS FOR DENTAL FOLLOW UP CARE  [place] Department of Health and Human Services - Local Safety Net Dental Clinics [email or web address]   [facility] [phone])  [facility] [phone])  [place] [phone] ext [phone])  [facility] [phone])  [facility] [phone]) This clinic caters to the indigent population and is on a lottery system. Location: [employer] [facility], [employer], [address] [place] Clinic Hours: Wednesdays from 6pm - 9pm, patients seen by a lottery system. For dates, call or go to [email or web address] Services: Cleanings, fillings and simple extractions. Payment Options: DENTAL WORK IS FREE OF CHARGE. Bring proof of income or support. Best way to get seen: Arrive at 5:15 pm - this is a lottery, NOT first come/first serve, so arriving earlier will not increase your chances of being seen.     [facility] [phone] Select option 1 for emergencies   Location: [facility], [place], [address] [place] Clinic Hours: No walk-ins accepted - call the day before to schedule an appointment. Check in times are 9:30 am and 1:30 pm. Services: Simple extractions, temporary fillings, pulpectomy/pulp debridement, uncomplicated abscess drainage. Payment Options: PAYMENT IS DUE AT THE TIME OF SERVICE.  Fee is usually $100-200, additional surgical procedures (e.g. abscess drainage) may be extra. Cash, checks, Visa/MasterCard accepted.  Can file Medicaid if patient is covered for dental - patient should call case worker to check. No discount for [facility]  patients. Best way to get seen: MUST call the day before and get onto the schedule. Can usually be seen the next 1-2 days. No walk-ins accepted.     [facility] [phone]   Location: [facility], [address] [place] Clinic Hours: M, W, Th, F 8am or 1:30pm, Tues 9a or 1:30 - first come/first served. Services: Simple extractions, temporary fillings, uncomplicated abscess drainage.  You do not need to be an [facility] resident. Payment Options: PAYMENT IS DUE AT THE TIME OF SERVICE. Dental insurance, otherwise sliding scale - bring proof of income or support. Depending on income and treatment needed, cost is usually $50-200. Best way to get seen: Arrive early as it is first come/first served.     [facility] [phone]   Location: [address] Clinic Hours: Mon-Thu 8a-5p Services: Most basic dental services including extractions and fillings. Payment Options: PAYMENT IS DUE AT THE TIME OF SERVICE. Sliding scale, up to 50% off - bring proof if income or support. Medicaid with dental option accepted. Best way to get seen: Call to schedule an appointment, can usually be seen within 2 weeks OR they will try to see walk-ins - show up at 8a or 2p (you may have to wait).     [facility] [phone] [place] RESIDENTS ONLY   Location: [facility], [address], [place], [place] [address] Clinic Hours: By appointment only. Monday - Thursday 8am-5pm, Friday 8am-12pm Services: Cleanings, fillings, extractions. Payment Options: PAYMENT IS DUE AT THE TIME OF SERVICE. Cash, Visa or MasterCard. Sliding scale - $30 minimum per service. Best way to get seen: Come in to office, complete packet and make an appointment - need proof of income or support monies  for each household member and proof of [facility] residence. Usually takes about a month to  get in.     [facility] [phone]   Location: [address] [facility] Clinic Hours: Walk-in Urgent Care Dental Services are offered Monday-Friday mornings only. The numbers of emergencies accepted daily is limited to the number of providers available. Maximum 15 - Mondays, Wednesdays & Thursdays Maximum 10 - Tuesdays & Fridays Services: You do not need to be a [facility] resident to be seen for a dental emergency. Emergencies are defined as pain, swelling, abnormal bleeding, or dental trauma. Walkins will receive x-rays if needed. NOTE: Dental cleaning is not an emergency. Payment Options: PAYMENT IS DUE AT THE TIME OF SERVICE. Minimum co-pay is $40.00 for uninsured patients. Minimum co-pay is $3.00 for Medicaid with dental coverage. Dental Insurance is accepted and must be presented at time of visit. Medicare does not cover dental. Forms of payment: Cash, credit card, checks. Best way to get seen: If not previously registered with the clinic, walk-in dental registration begins at 7:15 am and is on a first come/first serve basis. If previously registered with the clinic, call to make an appointment.     [facility] [phone] [place] RESIDENTS ONLY   Location: [address] [place], [place] Clinic Hours: Mon-Thu 10a-2p Services: Extractions only! Payment Options: FREE (donations accepted) - bring proof of income or support Best way to get seen: Call and schedule an appointment OR come at 8am on the 1st Monday of every month (except for holidays) when it is first come/first served.     [facility] [phone]   Location: [address] [place], [place] Clinic Hours: Friday mornings Services, Payment Options, Best way to get seen: Call for info

## 2020-08-05 NOTE — ED Triage Notes (Signed)
Pt here with c/o dental pain. Pt states pain for 2 weeks.

## 2020-08-05 NOTE — ED Notes (Signed)
See triage note, pt states she thinks she has a tooth infection to left side that started last week. Reports chills, denies fevers.  No swelling noted.  Pt in NAD, RR Even and unlabored, ambulatory to treatment room.

## 2020-08-06 ENCOUNTER — Emergency Department
Admission: EM | Admit: 2020-08-06 | Discharge: 2020-08-06 | Disposition: A | Discharge status: Home / Self Care | Payer: Self-pay | Attending: Emergency Medicine | Admitting: Emergency Medicine

## 2020-08-06 ENCOUNTER — Encounter: Payer: Self-pay | Admitting: Emergency Medicine

## 2020-08-06 ENCOUNTER — Other Ambulatory Visit: Payer: Self-pay

## 2020-08-06 DIAGNOSIS — Z5321 Procedure and treatment not carried out due to patient leaving prior to being seen by health care provider: Secondary | ICD-10-CM | POA: Insufficient documentation

## 2020-08-06 DIAGNOSIS — R519 Headache, unspecified: Secondary | ICD-10-CM | POA: Insufficient documentation

## 2020-08-06 DIAGNOSIS — R0981 Nasal congestion: Secondary | ICD-10-CM | POA: Insufficient documentation

## 2020-08-06 DIAGNOSIS — K0889 Other specified disorders of teeth and supporting structures: Secondary | ICD-10-CM | POA: Insufficient documentation

## 2020-08-06 NOTE — ED Triage Notes (Signed)
Pt here with c/o of headache. Pt states took 200 mg ibuprofen prior to arrival  Pt states pain for 2 weeks.   Pt states was seen yesterday for dental pain and was given antibiotic.

## 2020-08-06 NOTE — ED Notes (Signed)
Pt called no answer 

## 2020-08-06 NOTE — ED Notes (Signed)
Pt called with no answer

## 2020-08-06 NOTE — ED Notes (Addendum)
Pt called no answer 

## 2020-09-04 ENCOUNTER — Other Ambulatory Visit: Payer: Self-pay

## 2020-09-04 ENCOUNTER — Ambulatory Visit: Payer: Self-pay | Admitting: Physician Assistant

## 2020-09-04 DIAGNOSIS — Z299 Encounter for prophylactic measures, unspecified: Secondary | ICD-10-CM

## 2020-09-04 DIAGNOSIS — B009 Herpesviral infection, unspecified: Secondary | ICD-10-CM

## 2020-09-04 MED ORDER — ACYCLOVIR 400 MG PO TABS: 400.0000 mg | ORAL_TABLET | Freq: Three times a day (TID) | ORAL | 0 refills | Status: AC

## 2020-09-04 MED ORDER — ACYCLOVIR 800 MG PO TABS: 800.0000 mg | ORAL_TABLET | Freq: Two times a day (BID) | ORAL | 12 refills | Status: DC

## 2020-09-06 ENCOUNTER — Encounter: Payer: Self-pay | Admitting: Physician Assistant

## 2020-09-06 NOTE — Progress Notes (Signed)
S:  Patient walks into clinic requesting medicine for HSV.  Reports that she has a current outbreak and no Rx.  States that she also has no money to pay for medicine today.  Reports allergy to Codeine only. O:  WDWN female in NAD, A&O x 3, normal work of breathing. A/P:  1.  Patient with h/o HSV and having current outbreak. 2.  Patient given Acyclovir 400 mg #15 1 po TID for 5 days for current outbreak and Rx for Acyclovir 800 mg #10 1 po BID for 5 days with refills for 1 year to use in future. The patient was dispensed Acyclovir today. I provided counseling today regarding the medication. We discussed the medication, the side effects and when to call clinic. Patient given the opportunity to ask questions. Questions answered.  3.  No sex until after outbreak healed. 4.  Condoms with all sex. 5.  RTC prn.

## 2020-09-06 NOTE — Progress Notes (Signed)
Chart reviewed by Pharmacist  Suzanne Walker PharmD, Contract Pharmacist at [facility]  

## 2020-09-08 ENCOUNTER — Emergency Department: Payer: Self-pay

## 2020-09-08 ENCOUNTER — Emergency Department
Admission: EM | Admit: 2020-09-08 | Discharge: 2020-09-08 | Disposition: A | Discharge status: Home / Self Care | Payer: Self-pay | Attending: Emergency Medicine | Admitting: Emergency Medicine

## 2020-09-08 ENCOUNTER — Other Ambulatory Visit: Payer: Self-pay

## 2020-09-08 DIAGNOSIS — M545 Low back pain, unspecified: Secondary | ICD-10-CM

## 2020-09-08 DIAGNOSIS — M549 Dorsalgia, unspecified: Secondary | ICD-10-CM | POA: Insufficient documentation

## 2020-09-08 DIAGNOSIS — H938X3 Other specified disorders of ear, bilateral: Secondary | ICD-10-CM

## 2020-09-08 DIAGNOSIS — R109 Unspecified abdominal pain: Secondary | ICD-10-CM | POA: Insufficient documentation

## 2020-09-08 DIAGNOSIS — H9203 Otalgia, bilateral: Secondary | ICD-10-CM | POA: Insufficient documentation

## 2020-09-08 DIAGNOSIS — Z20822 Contact with and (suspected) exposure to covid-19: Secondary | ICD-10-CM | POA: Insufficient documentation

## 2020-09-08 DIAGNOSIS — R0981 Nasal congestion: Secondary | ICD-10-CM | POA: Insufficient documentation

## 2020-09-08 DIAGNOSIS — F17210 Nicotine dependence, cigarettes, uncomplicated: Secondary | ICD-10-CM | POA: Insufficient documentation

## 2020-09-08 DIAGNOSIS — J069 Acute upper respiratory infection, unspecified: Secondary | ICD-10-CM | POA: Insufficient documentation

## 2020-09-08 DIAGNOSIS — R590 Localized enlarged lymph nodes: Secondary | ICD-10-CM | POA: Insufficient documentation

## 2020-09-08 LAB — URINALYSIS, COMPLETE (UACMP) WITH MICROSCOPIC
Bacteria, UA: NONE SEEN
Bilirubin Urine: NEGATIVE
Glucose, UA: NEGATIVE mg/dL
Hgb urine dipstick: NEGATIVE
Ketones, ur: NEGATIVE mg/dL
Leukocytes,Ua: NEGATIVE
Nitrite: NEGATIVE
Protein, ur: NEGATIVE mg/dL
Specific Gravity, Urine: 1.015 (ref 1.005–1.030)
pH: 5 (ref 5.0–8.0)

## 2020-09-08 LAB — RESPIRATORY PANEL BY RT PCR (FLU A&B, COVID)
Influenza A by PCR: NEGATIVE
Influenza B by PCR: NEGATIVE
SARS Coronavirus 2 by RT PCR: NEGATIVE

## 2020-09-08 LAB — CBC
HCT: 35.8 % — ABNORMAL LOW (ref 36.0–46.0)
Hemoglobin: 11.4 g/dL — ABNORMAL LOW (ref 12.0–15.0)
MCH: 28.1 pg (ref 26.0–34.0)
MCHC: 31.8 g/dL (ref 30.0–36.0)
MCV: 88.4 fL (ref 80.0–100.0)
Platelets: 471 10*3/uL — ABNORMAL HIGH (ref 150–400)
RBC: 4.05 MIL/uL (ref 3.87–5.11)
RDW: 15.1 % (ref 11.5–15.5)
WBC: 9.9 10*3/uL (ref 4.0–10.5)
nRBC: 0 % (ref 0.0–0.2)

## 2020-09-08 LAB — COMPREHENSIVE METABOLIC PANEL
ALT: 21 U/L (ref 0–44)
AST: 21 U/L (ref 15–41)
Albumin: 4.3 g/dL (ref 3.5–5.0)
Alkaline Phosphatase: 59 U/L (ref 38–126)
Anion gap: 10 (ref 5–15)
BUN: 10 mg/dL (ref 6–20)
CO2: 27 mmol/L (ref 22–32)
Calcium: 9.1 mg/dL (ref 8.9–10.3)
Chloride: 104 mmol/L (ref 98–111)
Creatinine, Ser: 0.81 mg/dL (ref 0.44–1.00)
GFR, Estimated: >60 mL/min (ref >60)
Glucose, Bld: 109 mg/dL — ABNORMAL HIGH (ref 70–99)
Potassium: 3.9 mmol/L (ref 3.5–5.1)
Sodium: 141 mmol/L (ref 135–145)
Total Bilirubin: 0.6 mg/dL (ref 0.3–1.2)
Total Protein: 7.6 g/dL (ref 6.5–8.1)

## 2020-09-08 LAB — POC URINE PREG, ED: Preg Test, Ur: NEGATIVE

## 2020-09-08 LAB — LIPASE, BLOOD: Lipase: 28 U/L (ref 11–51)

## 2020-09-08 MED ORDER — KETOROLAC TROMETHAMINE 60 MG/2ML IM SOLN
60.0000 mg | Freq: Once | INTRAMUSCULAR | Status: AC
  Administered 2020-09-08: 60 mg via INTRAMUSCULAR
  Filled 2020-09-08: qty 2

## 2020-09-08 NOTE — ED Triage Notes (Signed)
Pt states her symptoms have been going on for months, states issues with her kidneys since march, states that she started drinking more water and cranberry juice and is having frequency, states that her bilat lower back hurts and that her abd is distended

## 2020-09-08 NOTE — ED Notes (Signed)
Pt provided with graham crackers and sprite per request.
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
## 2020-09-08 NOTE — ED Notes (Signed)
Pt reports pain in lower back bilaterally and kidney problems for several months. Pt denies urinary symptoms. Pt reports drinking more water and cranberry juice lately to help with kidney problems. No pain upon palpation to abdomen. Pt reports nausea without vomiting. Pain described as a soreness and rated 6/10. Pt also reports discomfort in ears and believes it may be from having 'stuffy' sinuses yesterday but would like it to be checked out as well.

## 2020-09-10 ENCOUNTER — Encounter: Payer: Self-pay | Admitting: Emergency Medicine

## 2020-09-10 ENCOUNTER — Emergency Department: Payer: Self-pay

## 2020-09-10 ENCOUNTER — Other Ambulatory Visit: Payer: Self-pay

## 2020-09-10 ENCOUNTER — Emergency Department
Admission: EM | Admit: 2020-09-10 | Discharge: 2020-09-11 | Disposition: A | Discharge status: Home / Self Care | Payer: Self-pay | Attending: Emergency Medicine | Admitting: Emergency Medicine

## 2020-09-10 DIAGNOSIS — F17210 Nicotine dependence, cigarettes, uncomplicated: Secondary | ICD-10-CM | POA: Insufficient documentation

## 2020-09-10 DIAGNOSIS — R1084 Generalized abdominal pain: Secondary | ICD-10-CM | POA: Insufficient documentation

## 2020-09-10 DIAGNOSIS — Z202 Contact with and (suspected) exposure to infections with a predominantly sexual mode of transmission: Secondary | ICD-10-CM | POA: Insufficient documentation

## 2020-09-10 LAB — URINALYSIS, COMPLETE (UACMP) WITH MICROSCOPIC
Bacteria, UA: NONE SEEN
Bilirubin Urine: NEGATIVE
Glucose, UA: NEGATIVE mg/dL
Ketones, ur: NEGATIVE mg/dL
Leukocytes,Ua: NEGATIVE
Nitrite: NEGATIVE
Protein, ur: NEGATIVE mg/dL
Specific Gravity, Urine: 1.023 (ref 1.005–1.030)
pH: 5 (ref 5.0–8.0)

## 2020-09-10 LAB — WET PREP, GENITAL
Clue Cells Wet Prep HPF POC: NONE SEEN
Sperm: NONE SEEN
Trich, Wet Prep: NONE SEEN
Yeast Wet Prep HPF POC: NONE SEEN

## 2020-09-10 LAB — COMPREHENSIVE METABOLIC PANEL
ALT: 17 U/L (ref 0–44)
AST: 17 U/L (ref 15–41)
Albumin: 4.4 g/dL (ref 3.5–5.0)
Alkaline Phosphatase: 61 U/L (ref 38–126)
Anion gap: 11 (ref 5–15)
BUN: 13 mg/dL (ref 6–20)
CO2: 25 mmol/L (ref 22–32)
Calcium: 9.3 mg/dL (ref 8.9–10.3)
Chloride: 104 mmol/L (ref 98–111)
Creatinine, Ser: 1 mg/dL (ref 0.44–1.00)
GFR, Estimated: >60 mL/min (ref >60)
Glucose, Bld: 85 mg/dL (ref 70–99)
Potassium: 3.5 mmol/L (ref 3.5–5.1)
Sodium: 140 mmol/L (ref 135–145)
Total Bilirubin: 0.4 mg/dL (ref 0.3–1.2)
Total Protein: 7.4 g/dL (ref 6.5–8.1)

## 2020-09-10 LAB — CBC WITH DIFFERENTIAL/PLATELET
Abs Immature Granulocytes: 0.02 10*3/uL (ref 0.00–0.07)
Basophils Absolute: 0 10*3/uL (ref 0.0–0.1)
Basophils Relative: 0 %
Eosinophils Absolute: 0.2 10*3/uL (ref 0.0–0.5)
Eosinophils Relative: 2 %
HCT: 34 % — ABNORMAL LOW (ref 36.0–46.0)
Hemoglobin: 11.1 g/dL — ABNORMAL LOW (ref 12.0–15.0)
Immature Granulocytes: 0 %
Lymphocytes Relative: 26 %
Lymphs Abs: 2.7 10*3/uL (ref 0.7–4.0)
MCH: 28.5 pg (ref 26.0–34.0)
MCHC: 32.6 g/dL (ref 30.0–36.0)
MCV: 87.4 fL (ref 80.0–100.0)
Monocytes Absolute: 0.9 10*3/uL (ref 0.1–1.0)
Monocytes Relative: 8 %
Neutro Abs: 6.6 10*3/uL (ref 1.7–7.7)
Neutrophils Relative %: 64 %
Platelets: 451 10*3/uL — ABNORMAL HIGH (ref 150–400)
RBC: 3.89 MIL/uL (ref 3.87–5.11)
RDW: 14.9 % (ref 11.5–15.5)
WBC: 10.4 10*3/uL (ref 4.0–10.5)
nRBC: 0 % (ref 0.0–0.2)

## 2020-09-10 LAB — CHLAMYDIA/NGC RT PCR (ARMC ONLY)
Chlamydia Tr: NOT DETECTED
N gonorrhoeae: NOT DETECTED

## 2020-09-10 MED ORDER — AZITHROMYCIN 500 MG PO TABS
1000.0000 mg | ORAL_TABLET | Freq: Once | ORAL | Status: AC
  Administered 2020-09-11: 1000 mg via ORAL
  Filled 2020-09-10: qty 2

## 2020-09-10 MED ORDER — CEFTRIAXONE SODIUM 1 G IJ SOLR
500.0000 mg | Freq: Once | INTRAMUSCULAR | Status: AC
  Administered 2020-09-11: 500 mg via INTRAMUSCULAR
  Filled 2020-09-10: qty 10

## 2020-09-10 MED ORDER — DOXYCYCLINE HYCLATE 100 MG PO TABS
100.0000 mg | ORAL_TABLET | Freq: Once | ORAL | Status: AC
  Administered 2020-09-11: 100 mg via ORAL
  Filled 2020-09-10: qty 1

## 2020-09-10 MED ORDER — IOHEXOL 300 MG/ML  SOLN
100.0000 mL | Freq: Once | INTRAMUSCULAR | Status: AC | PRN
  Administered 2020-09-10: 100 mL via INTRAVENOUS
  Filled 2020-09-10: qty 100

## 2020-09-10 MED ORDER — ONDANSETRON 4 MG PO TBDP
4.0000 mg | ORAL_TABLET | Freq: Once | ORAL | Status: AC
  Administered 2020-09-11: 4 mg via ORAL
  Filled 2020-09-10: qty 1

## 2020-09-10 NOTE — ED Notes (Signed)
Pt reports some mild itching in umbilical area, states "doesn't really itch anywhere, else, mostly just there."

## 2020-09-10 NOTE — ED Triage Notes (Signed)
Pt arrived via POV with reports of possible exposure to STD, pt states has had some abnormal vaginal discharge but denies any foul odor. Seen 2 days ago for back pain.

## 2020-09-10 NOTE — ED Notes (Addendum)
PT DOES NOT WANT ANYONE CALLED, NOTIFIED, OR INFORMATION GIVEN OUT.   Will inform registration to protect EHR access
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
## 2020-09-25 ENCOUNTER — Emergency Department: Payer: Self-pay

## 2020-09-25 ENCOUNTER — Other Ambulatory Visit: Payer: Self-pay

## 2020-09-25 DIAGNOSIS — Z9851 Tubal ligation status: Secondary | ICD-10-CM | POA: Insufficient documentation

## 2020-09-25 DIAGNOSIS — N898 Other specified noninflammatory disorders of vagina: Secondary | ICD-10-CM | POA: Insufficient documentation

## 2020-09-25 DIAGNOSIS — F17210 Nicotine dependence, cigarettes, uncomplicated: Secondary | ICD-10-CM | POA: Insufficient documentation

## 2020-09-25 DIAGNOSIS — Z9049 Acquired absence of other specified parts of digestive tract: Secondary | ICD-10-CM | POA: Insufficient documentation

## 2020-09-25 DIAGNOSIS — R102 Pelvic and perineal pain: Secondary | ICD-10-CM | POA: Insufficient documentation

## 2020-09-25 LAB — COMPREHENSIVE METABOLIC PANEL
ALT: 14 U/L (ref 0–44)
AST: 14 U/L — ABNORMAL LOW (ref 15–41)
Albumin: 4.4 g/dL (ref 3.5–5.0)
Alkaline Phosphatase: 69 U/L (ref 38–126)
Anion gap: 12 (ref 5–15)
BUN: 11 mg/dL (ref 6–20)
CO2: 26 mmol/L (ref 22–32)
Calcium: 9.3 mg/dL (ref 8.9–10.3)
Chloride: 102 mmol/L (ref 98–111)
Creatinine, Ser: 0.86 mg/dL (ref 0.44–1.00)
GFR, Estimated: >60 mL/min (ref >60)
Glucose, Bld: 118 mg/dL — ABNORMAL HIGH (ref 70–99)
Potassium: 3.6 mmol/L (ref 3.5–5.1)
Sodium: 140 mmol/L (ref 135–145)
Total Bilirubin: 0.5 mg/dL (ref 0.3–1.2)
Total Protein: 7.7 g/dL (ref 6.5–8.1)

## 2020-09-25 LAB — URINALYSIS, COMPLETE (UACMP) WITH MICROSCOPIC
Bacteria, UA: NONE SEEN
Bilirubin Urine: NEGATIVE
Glucose, UA: NEGATIVE mg/dL
Hgb urine dipstick: NEGATIVE
Ketones, ur: NEGATIVE mg/dL
Nitrite: NEGATIVE
Protein, ur: NEGATIVE mg/dL
Specific Gravity, Urine: 1.021 (ref 1.005–1.030)
pH: 5 (ref 5.0–8.0)

## 2020-09-25 LAB — CBC
HCT: 37.5 % (ref 36.0–46.0)
Hemoglobin: 12 g/dL (ref 12.0–15.0)
MCH: 28.1 pg (ref 26.0–34.0)
MCHC: 32 g/dL (ref 30.0–36.0)
MCV: 87.8 fL (ref 80.0–100.0)
Platelets: 430 10*3/uL — ABNORMAL HIGH (ref 150–400)
RBC: 4.27 MIL/uL (ref 3.87–5.11)
RDW: 14.7 % (ref 11.5–15.5)
WBC: 13.9 10*3/uL — ABNORMAL HIGH (ref 4.0–10.5)
nRBC: 0 % (ref 0.0–0.2)

## 2020-09-25 NOTE — ED Triage Notes (Addendum)
Pt to ED via POV from home with c/o lower abdominal pain x3 days. Pt stating possible UTI or STD exposure. Pt stating pain with urination, abnormal vaginal discharge and foul odor. Pt with hx kidney stones and ovarian cyst.

## 2020-09-26 ENCOUNTER — Emergency Department
Admission: EM | Admit: 2020-09-26 | Discharge: 2020-09-26 | Disposition: A | Discharge status: Home / Self Care | Payer: Self-pay | Attending: Emergency Medicine | Admitting: Emergency Medicine

## 2020-09-26 DIAGNOSIS — R52 Pain, unspecified: Secondary | ICD-10-CM

## 2020-09-26 DIAGNOSIS — R102 Pelvic and perineal pain: Secondary | ICD-10-CM

## 2020-09-26 LAB — CHLAMYDIA/NGC RT PCR (ARMC ONLY)
Chlamydia Tr: NOT DETECTED
N gonorrhoeae: NOT DETECTED

## 2020-09-26 MED ORDER — FLUCONAZOLE 150 MG PO TABS: 150.0000 mg | ORAL_TABLET | Freq: Once | ORAL | 0 refills | Status: AC

## 2020-09-26 NOTE — ED Notes (Signed)
No answer when pt called from lobby.

## 2020-09-26 NOTE — ED Notes (Signed)
Pt not in lobby for vital sign recheck.
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
## 2020-09-28 ENCOUNTER — Ambulatory Visit: Payer: Self-pay

## 2020-10-19 ENCOUNTER — Encounter: Payer: Self-pay | Admitting: Emergency Medicine

## 2020-10-19 ENCOUNTER — Emergency Department
Admission: EM | Admit: 2020-10-19 | Discharge: 2020-10-19 | Disposition: A | Discharge status: Home / Self Care | Payer: Self-pay | Attending: Emergency Medicine | Admitting: Emergency Medicine

## 2020-10-19 ENCOUNTER — Other Ambulatory Visit: Payer: Self-pay

## 2020-10-19 DIAGNOSIS — B9689 Other specified bacterial agents as the cause of diseases classified elsewhere: Secondary | ICD-10-CM

## 2020-10-19 DIAGNOSIS — K640 First degree hemorrhoids: Secondary | ICD-10-CM

## 2020-10-19 DIAGNOSIS — F17210 Nicotine dependence, cigarettes, uncomplicated: Secondary | ICD-10-CM | POA: Insufficient documentation

## 2020-10-19 DIAGNOSIS — K529 Noninfective gastroenteritis and colitis, unspecified: Secondary | ICD-10-CM | POA: Insufficient documentation

## 2020-10-19 DIAGNOSIS — K649 Unspecified hemorrhoids: Secondary | ICD-10-CM | POA: Insufficient documentation

## 2020-10-19 DIAGNOSIS — N760 Acute vaginitis: Secondary | ICD-10-CM | POA: Insufficient documentation

## 2020-10-19 LAB — LIPASE, BLOOD: Lipase: 65 U/L — ABNORMAL HIGH (ref 11–51)

## 2020-10-19 LAB — WET PREP, GENITAL
Sperm: NONE SEEN
Trich, Wet Prep: NONE SEEN
Yeast Wet Prep HPF POC: NONE SEEN

## 2020-10-19 LAB — COMPREHENSIVE METABOLIC PANEL
ALT: 12 U/L (ref 0–44)
AST: 15 U/L (ref 15–41)
Albumin: 4.3 g/dL (ref 3.5–5.0)
Alkaline Phosphatase: 63 U/L (ref 38–126)
Anion gap: 10 (ref 5–15)
BUN: 12 mg/dL (ref 6–20)
CO2: 24 mmol/L (ref 22–32)
Calcium: 9.3 mg/dL (ref 8.9–10.3)
Chloride: 105 mmol/L (ref 98–111)
Creatinine, Ser: 0.77 mg/dL (ref 0.44–1.00)
GFR, Estimated: >60 mL/min (ref >60)
Glucose, Bld: 100 mg/dL — ABNORMAL HIGH (ref 70–99)
Potassium: 3.6 mmol/L (ref 3.5–5.1)
Sodium: 139 mmol/L (ref 135–145)
Total Bilirubin: 0.4 mg/dL (ref 0.3–1.2)
Total Protein: 7.9 g/dL (ref 6.5–8.1)

## 2020-10-19 LAB — URINALYSIS, COMPLETE (UACMP) WITH MICROSCOPIC
Bilirubin Urine: NEGATIVE
Glucose, UA: NEGATIVE mg/dL
Ketones, ur: NEGATIVE mg/dL
Leukocytes,Ua: NEGATIVE
Nitrite: NEGATIVE
Protein, ur: NEGATIVE mg/dL
Specific Gravity, Urine: 1.013 (ref 1.005–1.030)
pH: 5 (ref 5.0–8.0)

## 2020-10-19 LAB — CBC
HCT: 38.3 % (ref 36.0–46.0)
Hemoglobin: 12.6 g/dL (ref 12.0–15.0)
MCH: 28.5 pg (ref 26.0–34.0)
MCHC: 32.9 g/dL (ref 30.0–36.0)
MCV: 86.7 fL (ref 80.0–100.0)
Platelets: 441 10*3/uL — ABNORMAL HIGH (ref 150–400)
RBC: 4.42 MIL/uL (ref 3.87–5.11)
RDW: 15 % (ref 11.5–15.5)
WBC: 10.3 10*3/uL (ref 4.0–10.5)
nRBC: 0 % (ref 0.0–0.2)

## 2020-10-19 LAB — POC URINE PREG, ED: Preg Test, Ur: NEGATIVE

## 2020-10-19 MED ORDER — METRONIDAZOLE 500 MG PO TABS: 500.0000 mg | ORAL_TABLET | Freq: Two times a day (BID) | ORAL | 0 refills | Status: AC

## 2020-10-19 MED ORDER — METRONIDAZOLE 500 MG PO TABS: 500.0000 mg | ORAL_TABLET | Freq: Two times a day (BID) | ORAL | 0 refills | Status: DC

## 2020-10-19 NOTE — ED Notes (Addendum)
Pt has black eye (left). Pt's abd tender at LUQ. Nausea/diarrhea x3-4 days. Itching/discharge noted per pt at rectum and vagina. Pt denies specific mechanism of injury for abd pain. Skin dry. Resp reg/unlabored. Pt resting calmly in bed. Pt reports sometimes starts to vomit after trying to eat something.

## 2020-10-19 NOTE — ED Triage Notes (Signed)
C/O nausea and diarrhea x 4 days.  AAOx3.  Skin warm and dry. NAD

## 2020-10-19 NOTE — ED Notes (Signed)
Pelvic cart to bedside 
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
## 2020-10-20 ENCOUNTER — Emergency Department: Payer: Self-pay

## 2020-10-20 ENCOUNTER — Emergency Department
Admission: EM | Admit: 2020-10-20 | Discharge: 2020-10-20 | Disposition: A | Discharge status: Home / Self Care | Payer: Self-pay | Attending: Emergency Medicine | Admitting: Emergency Medicine

## 2020-10-20 ENCOUNTER — Other Ambulatory Visit: Payer: Self-pay

## 2020-10-20 DIAGNOSIS — F17210 Nicotine dependence, cigarettes, uncomplicated: Secondary | ICD-10-CM | POA: Insufficient documentation

## 2020-10-20 DIAGNOSIS — R079 Chest pain, unspecified: Secondary | ICD-10-CM | POA: Insufficient documentation

## 2020-10-20 DIAGNOSIS — R197 Diarrhea, unspecified: Secondary | ICD-10-CM | POA: Insufficient documentation

## 2020-10-20 DIAGNOSIS — R059 Cough, unspecified: Secondary | ICD-10-CM | POA: Insufficient documentation

## 2020-10-20 DIAGNOSIS — R112 Nausea with vomiting, unspecified: Secondary | ICD-10-CM | POA: Insufficient documentation

## 2020-10-20 DIAGNOSIS — Z20822 Contact with and (suspected) exposure to covid-19: Secondary | ICD-10-CM | POA: Insufficient documentation

## 2020-10-20 DIAGNOSIS — R1032 Left lower quadrant pain: Secondary | ICD-10-CM | POA: Insufficient documentation

## 2020-10-20 LAB — HEPATIC FUNCTION PANEL
ALT: 11 U/L (ref 0–44)
AST: 15 U/L (ref 15–41)
Albumin: 4.4 g/dL (ref 3.5–5.0)
Alkaline Phosphatase: 62 U/L (ref 38–126)
Bilirubin, Direct: <0.1 mg/dL (ref 0.0–0.2)
Total Bilirubin: 0.5 mg/dL (ref 0.3–1.2)
Total Protein: 7.8 g/dL (ref 6.5–8.1)

## 2020-10-20 LAB — CBC
HCT: 37.7 % (ref 36.0–46.0)
Hemoglobin: 12.3 g/dL (ref 12.0–15.0)
MCH: 28 pg (ref 26.0–34.0)
MCHC: 32.6 g/dL (ref 30.0–36.0)
MCV: 85.7 fL (ref 80.0–100.0)
Platelets: 431 10*3/uL — ABNORMAL HIGH (ref 150–400)
RBC: 4.4 MIL/uL (ref 3.87–5.11)
RDW: 15 % (ref 11.5–15.5)
WBC: 10.1 10*3/uL (ref 4.0–10.5)
nRBC: 0 % (ref 0.0–0.2)

## 2020-10-20 LAB — BASIC METABOLIC PANEL
Anion gap: 8 (ref 5–15)
BUN: 13 mg/dL (ref 6–20)
CO2: 23 mmol/L (ref 22–32)
Calcium: 9.2 mg/dL (ref 8.9–10.3)
Chloride: 105 mmol/L (ref 98–111)
Creatinine, Ser: 0.73 mg/dL (ref 0.44–1.00)
GFR, Estimated: >60 mL/min (ref >60)
Glucose, Bld: 91 mg/dL (ref 70–99)
Potassium: 3.8 mmol/L (ref 3.5–5.1)
Sodium: 136 mmol/L (ref 135–145)

## 2020-10-20 LAB — CHLAMYDIA/NGC RT PCR (ARMC ONLY)
Chlamydia Tr: NOT DETECTED
N gonorrhoeae: NOT DETECTED

## 2020-10-20 LAB — TROPONIN I (HIGH SENSITIVITY)
Troponin I (High Sensitivity): 6 ng/L (ref <18)
Troponin I (High Sensitivity): 3 ng/L (ref <18)

## 2020-10-20 LAB — LIPASE, BLOOD: Lipase: 38 U/L (ref 11–51)

## 2020-10-20 LAB — POC URINE PREG, ED: Preg Test, Ur: NEGATIVE

## 2020-10-20 LAB — POC SARS CORONAVIRUS 2 AG -  ED: SARS Coronavirus 2 Ag: NEGATIVE

## 2020-10-20 MED ORDER — ONDANSETRON HCL 4 MG PO TABS: 4.0000 mg | ORAL_TABLET | Freq: Three times a day (TID) | ORAL | 0 refills | Status: DC | PRN

## 2020-10-20 MED ORDER — IOHEXOL 300 MG/ML  SOLN
100.0000 mL | Freq: Once | INTRAMUSCULAR | Status: AC | PRN
  Administered 2020-10-20: 100 mL via INTRAVENOUS
  Filled 2020-10-20: qty 100

## 2020-10-20 NOTE — ED Notes (Signed)
Pt presents with c/o "not feeling well" for a few days. Pt states she was seen here yesterday and dxd w/ bacterial vaginosis. Pt states she is still feeling malaise and fatigue so she returned to the ED. Pt A&O x 4 and ambulatory on arrival. Pt appears to be in NAD at this time.
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
## 2020-10-20 NOTE — ED Triage Notes (Signed)
Reports chest pain ongoing X 1 week; was in ED last night for bacterial vaginosis. Pt reports that she now needs a COVID test. Reports cough, congestion, and URI sx.

## 2020-10-21 LAB — URINE CULTURE: Culture: <10000 — AB

## 2020-10-21 LAB — RESP PANEL BY RT-PCR (FLU A&B, COVID) ARPGX2
Influenza A by PCR: NEGATIVE
Influenza B by PCR: NEGATIVE
SARS Coronavirus 2 by RT PCR: NEGATIVE

## 2020-10-24 ENCOUNTER — Encounter: Payer: Self-pay | Admitting: Emergency Medicine

## 2020-10-24 ENCOUNTER — Emergency Department
Admission: EM | Admit: 2020-10-24 | Discharge: 2020-10-24 | Disposition: A | Discharge status: Home / Self Care | Payer: Self-pay | Attending: Emergency Medicine | Admitting: Emergency Medicine

## 2020-10-24 DIAGNOSIS — W57XXXA Bitten or stung by nonvenomous insect and other nonvenomous arthropods, initial encounter: Secondary | ICD-10-CM | POA: Insufficient documentation

## 2020-10-24 DIAGNOSIS — Z5321 Procedure and treatment not carried out due to patient leaving prior to being seen by health care provider: Secondary | ICD-10-CM | POA: Insufficient documentation

## 2020-10-24 DIAGNOSIS — Y9355 Activity, bike riding: Secondary | ICD-10-CM | POA: Insufficient documentation

## 2020-10-24 DIAGNOSIS — S1096XA Insect bite of unspecified part of neck, initial encounter: Secondary | ICD-10-CM | POA: Insufficient documentation

## 2020-10-24 NOTE — ED Notes (Signed)
Attempted to call patient x 2, no answer on cell phone, unable to leave voicemail.

## 2020-10-24 NOTE — ED Notes (Signed)
Attempted to call pt's cell phone for rooming, no answer.

## 2020-10-24 NOTE — ED Notes (Signed)
Attempted to call pt's cell phone listed by registration in comments [phone], called for patient in lobby, pt not visualized in lobby at this time.
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
## 2020-10-30 ENCOUNTER — Other Ambulatory Visit: Payer: Self-pay

## 2020-10-30 ENCOUNTER — Encounter: Payer: Self-pay | Admitting: Emergency Medicine

## 2020-10-30 ENCOUNTER — Other Ambulatory Visit: Payer: Self-pay | Admitting: Emergency Medicine

## 2020-10-30 ENCOUNTER — Emergency Department
Admission: EM | Admit: 2020-10-30 | Discharge: 2020-10-30 | Disposition: A | Discharge status: Home / Self Care | Payer: Self-pay | Attending: Emergency Medicine | Admitting: Emergency Medicine

## 2020-10-30 DIAGNOSIS — K0889 Other specified disorders of teeth and supporting structures: Secondary | ICD-10-CM | POA: Insufficient documentation

## 2020-10-30 DIAGNOSIS — F17210 Nicotine dependence, cigarettes, uncomplicated: Secondary | ICD-10-CM | POA: Insufficient documentation

## 2020-10-30 MED ORDER — AMOXICILLIN 500 MG PO TABS: 500.0000 mg | ORAL_TABLET | Freq: Three times a day (TID) | ORAL | 0 refills | Status: DC

## 2020-10-30 NOTE — ED Notes (Signed)
NAD noted at time of D/C. Pt denies questions or concerns. Pt ambulatory to the lobby at this time.  
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
## 2020-10-30 NOTE — ED Triage Notes (Signed)
Tooth abscess to left lower jaw

## 2020-11-10 ENCOUNTER — Other Ambulatory Visit: Payer: Self-pay

## 2020-11-10 ENCOUNTER — Encounter: Payer: Self-pay | Admitting: Emergency Medicine

## 2020-11-10 ENCOUNTER — Other Ambulatory Visit: Payer: Self-pay | Admitting: Emergency Medicine

## 2020-11-10 ENCOUNTER — Emergency Department
Admission: EM | Admit: 2020-11-10 | Discharge: 2020-11-10 | Disposition: A | Discharge status: Home / Self Care | Payer: Self-pay | Attending: Emergency Medicine | Admitting: Emergency Medicine

## 2020-11-10 DIAGNOSIS — F17210 Nicotine dependence, cigarettes, uncomplicated: Secondary | ICD-10-CM | POA: Insufficient documentation

## 2020-11-10 DIAGNOSIS — M545 Low back pain, unspecified: Secondary | ICD-10-CM

## 2020-11-10 DIAGNOSIS — S39012A Strain of muscle, fascia and tendon of lower back, initial encounter: Secondary | ICD-10-CM | POA: Insufficient documentation

## 2020-11-10 DIAGNOSIS — Y990 Civilian activity done for income or pay: Secondary | ICD-10-CM | POA: Insufficient documentation

## 2020-11-10 DIAGNOSIS — X500XXA Overexertion from strenuous movement or load, initial encounter: Secondary | ICD-10-CM | POA: Insufficient documentation

## 2020-11-10 MED ORDER — LIDOCAINE 5 % EX PTCH: 1.0000 | MEDICATED_PATCH | Freq: Two times a day (BID) | CUTANEOUS | 0 refills | Status: DC

## 2020-11-10 MED ORDER — CYCLOBENZAPRINE HCL 5 MG PO TABS: ORAL_TABLET | ORAL | 0 refills | Status: DC

## 2020-11-10 MED ORDER — IBUPROFEN 600 MG PO TABS: 600.0000 mg | ORAL_TABLET | Freq: Four times a day (QID) | ORAL | 0 refills | Status: DC | PRN

## 2020-11-10 NOTE — ED Triage Notes (Signed)
Pt to ed with c/o lower back pain since Thursday. 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 Pt ambulates without difficulty. Pt states severe lower back pain and right groin pain. States laying down makes it worse when she tries to get up.
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
## 2020-12-17 ENCOUNTER — Other Ambulatory Visit: Payer: Self-pay

## 2020-12-17 ENCOUNTER — Emergency Department
Admission: EM | Admit: 2020-12-17 | Discharge: 2020-12-17 | Disposition: A | Discharge status: Home / Self Care | Payer: Self-pay | Attending: Emergency Medicine | Admitting: Emergency Medicine

## 2020-12-17 DIAGNOSIS — N760 Acute vaginitis: Secondary | ICD-10-CM

## 2020-12-17 DIAGNOSIS — R350 Frequency of micturition: Secondary | ICD-10-CM

## 2020-12-17 DIAGNOSIS — F17210 Nicotine dependence, cigarettes, uncomplicated: Secondary | ICD-10-CM | POA: Insufficient documentation

## 2020-12-17 LAB — URINALYSIS, COMPLETE (UACMP) WITH MICROSCOPIC
Bacteria, UA: NONE SEEN
Bilirubin Urine: NEGATIVE
Glucose, UA: NEGATIVE mg/dL
Ketones, ur: NEGATIVE mg/dL
Leukocytes,Ua: NEGATIVE
Nitrite: NEGATIVE
Protein, ur: NEGATIVE mg/dL
Specific Gravity, Urine: 1.006 (ref 1.005–1.030)
pH: 6 (ref 5.0–8.0)

## 2020-12-17 LAB — BASIC METABOLIC PANEL
Anion gap: 8 (ref 5–15)
BUN: 9 mg/dL (ref 6–20)
CO2: 25 mmol/L (ref 22–32)
Calcium: 9.3 mg/dL (ref 8.9–10.3)
Chloride: 104 mmol/L (ref 98–111)
Creatinine, Ser: 0.75 mg/dL (ref 0.44–1.00)
GFR, Estimated: >60 mL/min (ref >60)
Glucose, Bld: 102 mg/dL — ABNORMAL HIGH (ref 70–99)
Potassium: 3.6 mmol/L (ref 3.5–5.1)
Sodium: 137 mmol/L (ref 135–145)

## 2020-12-17 LAB — WET PREP, GENITAL
Clue Cells Wet Prep HPF POC: NONE SEEN
Sperm: NONE SEEN
Trich, Wet Prep: NONE SEEN
Yeast Wet Prep HPF POC: NONE SEEN

## 2020-12-17 LAB — CBC
HCT: 39.9 % (ref 36.0–46.0)
Hemoglobin: 12.8 g/dL (ref 12.0–15.0)
MCH: 27.9 pg (ref 26.0–34.0)
MCHC: 32.1 g/dL (ref 30.0–36.0)
MCV: 86.9 fL (ref 80.0–100.0)
Platelets: 369 10*3/uL (ref 150–400)
RBC: 4.59 MIL/uL (ref 3.87–5.11)
RDW: 14.7 % (ref 11.5–15.5)
WBC: 9.8 10*3/uL (ref 4.0–10.5)
nRBC: 0 % (ref 0.0–0.2)

## 2020-12-17 LAB — POC URINE PREG, ED: Preg Test, Ur: NEGATIVE

## 2020-12-17 NOTE — ED Triage Notes (Signed)
Pt c/o "lower abd pain with feeling bloated, feel like I always having to go the bathroom to urinate", having some painful urination. Denies N/V/D.Marland Kitchen

## 2020-12-17 NOTE — ED Triage Notes (Signed)
First Nurse Note:  Arrives c/o 'stomach problems for a while".  AAOx3.  Skin warm and dry.  Posture upright and relaxed.  Ambulates with easy and steady gait.  NAD
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
## 2021-01-24 ENCOUNTER — Encounter: Payer: Self-pay | Admitting: Intensive Care

## 2021-01-24 ENCOUNTER — Emergency Department
Admission: EM | Admit: 2021-01-24 | Discharge: 2021-01-24 | Discharge status: Against Medical Advice | Payer: Self-pay | Attending: Emergency Medicine | Admitting: Emergency Medicine

## 2021-01-24 ENCOUNTER — Other Ambulatory Visit: Payer: Self-pay

## 2021-01-24 ENCOUNTER — Emergency Department: Payer: Self-pay

## 2021-01-24 DIAGNOSIS — R0789 Other chest pain: Secondary | ICD-10-CM | POA: Insufficient documentation

## 2021-01-24 DIAGNOSIS — F17210 Nicotine dependence, cigarettes, uncomplicated: Secondary | ICD-10-CM | POA: Insufficient documentation

## 2021-01-24 HISTORY — DX: Depression, unspecified: F32.A

## 2021-01-24 LAB — BASIC METABOLIC PANEL
Anion gap: 6 (ref 5–15)
BUN: <5 mg/dL — ABNORMAL LOW (ref 6–20)
CO2: 26 mmol/L (ref 22–32)
Calcium: 9 mg/dL (ref 8.9–10.3)
Chloride: 105 mmol/L (ref 98–111)
Creatinine, Ser: 0.74 mg/dL (ref 0.44–1.00)
GFR, Estimated: >60 mL/min (ref >60)
Glucose, Bld: 100 mg/dL — ABNORMAL HIGH (ref 70–99)
Potassium: 4.2 mmol/L (ref 3.5–5.1)
Sodium: 137 mmol/L (ref 135–145)

## 2021-01-24 LAB — TROPONIN I (HIGH SENSITIVITY)
Troponin I (High Sensitivity): 6 ng/L (ref <18)
Troponin I (High Sensitivity): 7 ng/L (ref <18)

## 2021-01-24 LAB — CBC
HCT: 36.9 % (ref 36.0–46.0)
Hemoglobin: 11.7 g/dL — ABNORMAL LOW (ref 12.0–15.0)
MCH: 28.3 pg (ref 26.0–34.0)
MCHC: 31.7 g/dL (ref 30.0–36.0)
MCV: 89.1 fL (ref 80.0–100.0)
Platelets: 321 10*3/uL (ref 150–400)
RBC: 4.14 MIL/uL (ref 3.87–5.11)
RDW: 15.1 % (ref 11.5–15.5)
WBC: 9 10*3/uL (ref 4.0–10.5)
nRBC: 0 % (ref 0.0–0.2)

## 2021-01-24 LAB — D-DIMER, QUANTITATIVE: D-Dimer, Quant: 0.62 ug/mL-FEU — ABNORMAL HIGH (ref 0.00–0.50)

## 2021-01-24 NOTE — ED Notes (Signed)
Pt asked to use the bathroom and did not return. MD notified.
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
## 2021-01-24 NOTE — ED Notes (Signed)
Patient is resting comfortably. 

## 2021-01-24 NOTE — ED Triage Notes (Signed)
Patient c/o sob X2-3 days. Also reports "Tired, cp, weakness, dizziness, and congestion."

## 2021-01-24 NOTE — ED Notes (Signed)
Pt to desk a ask about wait - informed, pt drinking water and wants directions to cafeteria to eat; informed to please wait on PO intake; no apparent resp distresss

## 2021-02-12 ENCOUNTER — Other Ambulatory Visit: Payer: Self-pay

## 2021-02-12 ENCOUNTER — Emergency Department
Admission: EM | Admit: 2021-02-12 | Discharge: 2021-02-12 | Disposition: A | Discharge status: Home / Self Care | Payer: Self-pay | Attending: Emergency Medicine | Admitting: Emergency Medicine

## 2021-02-12 ENCOUNTER — Emergency Department: Payer: Self-pay

## 2021-02-12 DIAGNOSIS — T07XXXA Unspecified multiple injuries, initial encounter: Secondary | ICD-10-CM

## 2021-02-12 DIAGNOSIS — S301XXA Contusion of abdominal wall, initial encounter: Secondary | ICD-10-CM | POA: Insufficient documentation

## 2021-02-12 DIAGNOSIS — R0789 Other chest pain: Secondary | ICD-10-CM | POA: Insufficient documentation

## 2021-02-12 DIAGNOSIS — F17210 Nicotine dependence, cigarettes, uncomplicated: Secondary | ICD-10-CM | POA: Insufficient documentation

## 2021-02-12 MED ORDER — CEPHALEXIN 500 MG PO CAPS: 500.0000 mg | ORAL_CAPSULE | Freq: Two times a day (BID) | ORAL | 0 refills | Status: DC

## 2021-02-12 MED ORDER — NAPROXEN 500 MG PO TABS
500.0000 mg | ORAL_TABLET | Freq: Once | ORAL | Status: AC
  Administered 2021-02-12: 500 mg via ORAL
  Filled 2021-02-12: qty 1
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
## 2021-02-12 NOTE — ED Notes (Signed)
Pt given lunch tray and drink 
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
## 2021-04-30 ENCOUNTER — Other Ambulatory Visit: Payer: Self-pay

## 2021-04-30 ENCOUNTER — Emergency Department
Admission: EM | Admit: 2021-04-30 | Discharge: 2021-04-30 | Disposition: A | Discharge status: Home / Self Care | Payer: Self-pay | Attending: Emergency Medicine | Admitting: Emergency Medicine

## 2021-04-30 DIAGNOSIS — F17210 Nicotine dependence, cigarettes, uncomplicated: Secondary | ICD-10-CM | POA: Insufficient documentation

## 2021-04-30 DIAGNOSIS — K047 Periapical abscess without sinus: Secondary | ICD-10-CM | POA: Insufficient documentation

## 2021-04-30 DIAGNOSIS — Z20822 Contact with and (suspected) exposure to covid-19: Secondary | ICD-10-CM | POA: Insufficient documentation

## 2021-04-30 DIAGNOSIS — N926 Irregular menstruation, unspecified: Secondary | ICD-10-CM | POA: Insufficient documentation

## 2021-04-30 DIAGNOSIS — N3001 Acute cystitis with hematuria: Secondary | ICD-10-CM | POA: Insufficient documentation

## 2021-04-30 LAB — URINALYSIS, COMPLETE (UACMP) WITH MICROSCOPIC
Bilirubin Urine: NEGATIVE
Glucose, UA: NEGATIVE mg/dL
Ketones, ur: NEGATIVE mg/dL
Nitrite: NEGATIVE
Protein, ur: NEGATIVE mg/dL
Specific Gravity, Urine: 1.018 (ref 1.005–1.030)
pH: 5 (ref 5.0–8.0)

## 2021-04-30 LAB — RESP PANEL BY RT-PCR (FLU A&B, COVID) ARPGX2
Influenza A by PCR: NEGATIVE
Influenza B by PCR: NEGATIVE
SARS Coronavirus 2 by RT PCR: NEGATIVE

## 2021-04-30 LAB — POC URINE PREG, ED: Preg Test, Ur: NEGATIVE

## 2021-04-30 MED ORDER — CEPHALEXIN 500 MG PO CAPS
500.0000 mg | ORAL_CAPSULE | Freq: Once | ORAL | Status: AC
  Administered 2021-04-30: 500 mg via ORAL
  Filled 2021-04-30: qty 1

## 2021-04-30 MED ORDER — CEPHALEXIN 500 MG PO CAPS: 500.0000 mg | ORAL_CAPSULE | Freq: Three times a day (TID) | ORAL | 0 refills | Status: AC

## 2021-04-30 NOTE — Discharge Instructions (Addendum)
Continue Tylenol or ibuprofen.  Follow-up with the dentist as well as primary care.  Return to the emergency department for symptoms of change or worsen if you are unable to schedule an appointment.

## 2021-04-30 NOTE — ED Triage Notes (Addendum)
Pt states she felt hot this afternoon and may have possibly had a fever, began to have a rash on chest. Pt states she also has not had a period in two months and would like to be checked for pregnancy. Pt states she also thinks she has an abscessed tooth on left lower side with associated headaches. Pt states she has also been "emotional" lately as well. Pt has multiple medical complaints.
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
## 2021-05-31 ENCOUNTER — Other Ambulatory Visit: Payer: Self-pay

## 2021-05-31 ENCOUNTER — Encounter: Payer: Self-pay | Admitting: Emergency Medicine

## 2021-05-31 ENCOUNTER — Emergency Department
Admission: EM | Admit: 2021-05-31 | Discharge: 2021-05-31 | Disposition: A | Discharge status: Home / Self Care | Payer: Self-pay | Attending: Student in an Organized Health Care Education/Training Program | Admitting: Student in an Organized Health Care Education/Training Program

## 2021-05-31 ENCOUNTER — Emergency Department: Payer: Self-pay

## 2021-05-31 DIAGNOSIS — W230XXA Caught, crushed, jammed, or pinched between moving objects, initial encounter: Secondary | ICD-10-CM | POA: Insufficient documentation

## 2021-05-31 DIAGNOSIS — S60132A Contusion of left middle finger with damage to nail, initial encounter: Secondary | ICD-10-CM | POA: Insufficient documentation

## 2021-05-31 DIAGNOSIS — R350 Frequency of micturition: Secondary | ICD-10-CM | POA: Insufficient documentation

## 2021-05-31 DIAGNOSIS — S6010XA Contusion of unspecified finger with damage to nail, initial encounter: Secondary | ICD-10-CM

## 2021-05-31 DIAGNOSIS — F17210 Nicotine dependence, cigarettes, uncomplicated: Secondary | ICD-10-CM | POA: Insufficient documentation

## 2021-05-31 DIAGNOSIS — R300 Dysuria: Secondary | ICD-10-CM | POA: Insufficient documentation

## 2021-05-31 LAB — URINALYSIS, COMPLETE (UACMP) WITH MICROSCOPIC
Bacteria, UA: NONE SEEN
Bilirubin Urine: NEGATIVE
Glucose, UA: NEGATIVE mg/dL
Ketones, ur: NEGATIVE mg/dL
Leukocytes,Ua: NEGATIVE
Nitrite: NEGATIVE
Protein, ur: NEGATIVE mg/dL
Specific Gravity, Urine: 1.006 (ref 1.005–1.030)
pH: 7 (ref 5.0–8.0)

## 2021-05-31 LAB — CBC WITH DIFFERENTIAL/PLATELET
Abs Immature Granulocytes: 0.03 10*3/uL (ref 0.00–0.07)
Basophils Absolute: 0 10*3/uL (ref 0.0–0.1)
Basophils Relative: 0 %
Eosinophils Absolute: 0.2 10*3/uL (ref 0.0–0.5)
Eosinophils Relative: 1 %
HCT: 37.9 % (ref 36.0–46.0)
Hemoglobin: 12.8 g/dL (ref 12.0–15.0)
Immature Granulocytes: 0 %
Lymphocytes Relative: 20 %
Lymphs Abs: 2.4 10*3/uL (ref 0.7–4.0)
MCH: 29.6 pg (ref 26.0–34.0)
MCHC: 33.8 g/dL (ref 30.0–36.0)
MCV: 87.5 fL (ref 80.0–100.0)
Monocytes Absolute: 0.7 10*3/uL (ref 0.1–1.0)
Monocytes Relative: 6 %
Neutro Abs: 8.5 10*3/uL — ABNORMAL HIGH (ref 1.7–7.7)
Neutrophils Relative %: 73 %
Platelets: 384 10*3/uL (ref 150–400)
RBC: 4.33 MIL/uL (ref 3.87–5.11)
RDW: 14.1 % (ref 11.5–15.5)
WBC: 11.8 10*3/uL — ABNORMAL HIGH (ref 4.0–10.5)
nRBC: 0 % (ref 0.0–0.2)

## 2021-05-31 LAB — POC URINE PREG, ED: Preg Test, Ur: NEGATIVE

## 2021-05-31 LAB — BASIC METABOLIC PANEL
Anion gap: 7 (ref 5–15)
BUN: 9 mg/dL (ref 6–20)
CO2: 26 mmol/L (ref 22–32)
Calcium: 9.4 mg/dL (ref 8.9–10.3)
Chloride: 105 mmol/L (ref 98–111)
Creatinine, Ser: 0.72 mg/dL (ref 0.44–1.00)
GFR, Estimated: >60 mL/min (ref >60)
Glucose, Bld: 94 mg/dL (ref 70–99)
Potassium: 3.8 mmol/L (ref 3.5–5.1)
Sodium: 138 mmol/L (ref 135–145)

## 2021-05-31 MED ORDER — CEPHALEXIN 500 MG PO CAPS
500.0000 mg | ORAL_CAPSULE | Freq: Once | ORAL | Status: AC
  Administered 2021-05-31: 500 mg via ORAL
  Filled 2021-05-31: qty 1

## 2021-05-31 MED ORDER — BACITRACIN ZINC 500 UNIT/GM EX OINT
TOPICAL_OINTMENT | Freq: Once | CUTANEOUS | Status: AC
  Filled 2021-05-31: qty 0.9

## 2021-05-31 MED ORDER — CEPHALEXIN 500 MG PO CAPS: 500.0000 mg | ORAL_CAPSULE | Freq: Three times a day (TID) | ORAL | 0 refills | Status: AC
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
## 2021-06-02 ENCOUNTER — Emergency Department
Admission: EM | Admit: 2021-06-02 | Discharge: 2021-06-02 | Disposition: A | Discharge status: Home / Self Care | Payer: Medicaid Other | Attending: Emergency Medicine | Admitting: Emergency Medicine

## 2021-06-02 ENCOUNTER — Other Ambulatory Visit: Payer: Self-pay

## 2021-06-02 ENCOUNTER — Encounter: Payer: Self-pay | Admitting: Emergency Medicine

## 2021-06-02 DIAGNOSIS — F17210 Nicotine dependence, cigarettes, uncomplicated: Secondary | ICD-10-CM | POA: Insufficient documentation

## 2021-06-02 DIAGNOSIS — T7491XA Unspecified adult maltreatment, confirmed, initial encounter: Secondary | ICD-10-CM

## 2021-06-02 DIAGNOSIS — T7411XA Adult physical abuse, confirmed, initial encounter: Secondary | ICD-10-CM | POA: Insufficient documentation

## 2021-06-02 DIAGNOSIS — I10 Essential (primary) hypertension: Secondary | ICD-10-CM | POA: Insufficient documentation

## 2021-06-02 LAB — BASIC METABOLIC PANEL
Anion gap: 6 (ref 5–15)
BUN: 13 mg/dL (ref 6–20)
CO2: 26 mmol/L (ref 22–32)
Calcium: 9.3 mg/dL (ref 8.9–10.3)
Chloride: 107 mmol/L (ref 98–111)
Creatinine, Ser: 0.72 mg/dL (ref 0.44–1.00)
GFR, Estimated: >60 mL/min (ref >60)
Glucose, Bld: 107 mg/dL — ABNORMAL HIGH (ref 70–99)
Potassium: 3.8 mmol/L (ref 3.5–5.1)
Sodium: 139 mmol/L (ref 135–145)

## 2021-06-02 LAB — CBC
HCT: 39.4 % (ref 36.0–46.0)
Hemoglobin: 13.1 g/dL (ref 12.0–15.0)
MCH: 29 pg (ref 26.0–34.0)
MCHC: 33.2 g/dL (ref 30.0–36.0)
MCV: 87.4 fL (ref 80.0–100.0)
Platelets: 394 10*3/uL (ref 150–400)
RBC: 4.51 MIL/uL (ref 3.87–5.11)
RDW: 14.1 % (ref 11.5–15.5)
WBC: 11.1 10*3/uL — ABNORMAL HIGH (ref 4.0–10.5)
nRBC: 0 % (ref 0.0–0.2)

## 2021-06-02 LAB — TROPONIN I (HIGH SENSITIVITY)
Troponin I (High Sensitivity): 6 ng/L (ref <18)
Troponin I (High Sensitivity): 5 ng/L (ref <18)

## 2021-06-02 LAB — POC URINE PREG, ED: Preg Test, Ur: NEGATIVE
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
## 2021-06-02 NOTE — ED Triage Notes (Signed)
Pt comes into the ED via POV c/o hypertension.  Pt states that she had a headache yesterday and just didn't "feel right" so she stopped by [facility] and noticed her BP up.  Pt denies any CP, SHOB, or dizziness.  Pt ambulatory to triage and in NAD with even and unlabored respirations.

## 2021-06-02 NOTE — Discharge Instructions (Signed)
You are not pregnant

## 2021-06-25 ENCOUNTER — Other Ambulatory Visit: Payer: Self-pay

## 2021-06-25 ENCOUNTER — Emergency Department
Admission: EM | Admit: 2021-06-25 | Discharge: 2021-06-25 | Disposition: A | Discharge status: Home / Self Care | Payer: Medicaid Other | Attending: Emergency Medicine | Admitting: Emergency Medicine

## 2021-06-25 DIAGNOSIS — F17210 Nicotine dependence, cigarettes, uncomplicated: Secondary | ICD-10-CM | POA: Insufficient documentation

## 2021-06-25 DIAGNOSIS — N898 Other specified noninflammatory disorders of vagina: Secondary | ICD-10-CM | POA: Insufficient documentation

## 2021-06-25 DIAGNOSIS — K047 Periapical abscess without sinus: Secondary | ICD-10-CM | POA: Insufficient documentation

## 2021-06-25 LAB — COMPREHENSIVE METABOLIC PANEL
ALT: 16 U/L (ref 0–44)
AST: 15 U/L (ref 15–41)
Albumin: 4.3 g/dL (ref 3.5–5.0)
Alkaline Phosphatase: 63 U/L (ref 38–126)
Anion gap: 6 (ref 5–15)
BUN: 8 mg/dL (ref 6–20)
CO2: 29 mmol/L (ref 22–32)
Calcium: 9.4 mg/dL (ref 8.9–10.3)
Chloride: 103 mmol/L (ref 98–111)
Creatinine, Ser: 0.67 mg/dL (ref 0.44–1.00)
GFR, Estimated: >60 mL/min (ref >60)
Glucose, Bld: 106 mg/dL — ABNORMAL HIGH (ref 70–99)
Potassium: 3.8 mmol/L (ref 3.5–5.1)
Sodium: 138 mmol/L (ref 135–145)
Total Bilirubin: 0.4 mg/dL (ref 0.3–1.2)
Total Protein: 7.3 g/dL (ref 6.5–8.1)

## 2021-06-25 LAB — CBC WITH DIFFERENTIAL/PLATELET
Abs Immature Granulocytes: 0.02 10*3/uL (ref 0.00–0.07)
Basophils Absolute: 0.1 10*3/uL (ref 0.0–0.1)
Basophils Relative: 1 %
Eosinophils Absolute: 0.2 10*3/uL (ref 0.0–0.5)
Eosinophils Relative: 2 %
HCT: 36.5 % (ref 36.0–46.0)
Hemoglobin: 12.1 g/dL (ref 12.0–15.0)
Immature Granulocytes: 0 %
Lymphocytes Relative: 24 %
Lymphs Abs: 2.2 10*3/uL (ref 0.7–4.0)
MCH: 29.2 pg (ref 26.0–34.0)
MCHC: 33.2 g/dL (ref 30.0–36.0)
MCV: 88.2 fL (ref 80.0–100.0)
Monocytes Absolute: 0.6 10*3/uL (ref 0.1–1.0)
Monocytes Relative: 7 %
Neutro Abs: 6.1 10*3/uL (ref 1.7–7.7)
Neutrophils Relative %: 66 %
Platelets: 406 10*3/uL — ABNORMAL HIGH (ref 150–400)
RBC: 4.14 MIL/uL (ref 3.87–5.11)
RDW: 14.1 % (ref 11.5–15.5)
WBC: 9.2 10*3/uL (ref 4.0–10.5)
nRBC: 0 % (ref 0.0–0.2)

## 2021-06-25 LAB — URINALYSIS, COMPLETE (UACMP) WITH MICROSCOPIC
Bilirubin Urine: NEGATIVE
Glucose, UA: NEGATIVE mg/dL
Ketones, ur: NEGATIVE mg/dL
Leukocytes,Ua: NEGATIVE
Nitrite: NEGATIVE
Protein, ur: NEGATIVE mg/dL
Specific Gravity, Urine: 1.01 (ref 1.005–1.030)
pH: 7 (ref 5.0–8.0)

## 2021-06-25 LAB — CHLAMYDIA/NGC RT PCR (ARMC ONLY)
Chlamydia Tr: NOT DETECTED
N gonorrhoeae: NOT DETECTED

## 2021-06-25 LAB — WET PREP, GENITAL
Clue Cells Wet Prep HPF POC: NONE SEEN
Sperm: NONE SEEN
Trich, Wet Prep: NONE SEEN
Yeast Wet Prep HPF POC: NONE SEEN

## 2021-06-25 LAB — POC URINE PREG, ED: Preg Test, Ur: NEGATIVE

## 2021-06-25 MED ORDER — AMOXICILLIN-POT CLAVULANATE 875-125 MG PO TABS: 1.0000 | ORAL_TABLET | Freq: Two times a day (BID) | ORAL | 0 refills | Status: DC

## 2021-06-25 MED ORDER — FLUCONAZOLE 150 MG PO TABS: 150.0000 mg | ORAL_TABLET | Freq: Once | ORAL | 0 refills | Status: AC

## 2021-06-25 MED ORDER — AMOXICILLIN-POT CLAVULANATE 875-125 MG PO TABS
1.0000 | ORAL_TABLET | Freq: Once | ORAL | Status: AC
  Administered 2021-06-25: 1 via ORAL
  Filled 2021-06-25: qty 1
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
## 2021-08-16 ENCOUNTER — Other Ambulatory Visit: Payer: Self-pay

## 2021-08-16 ENCOUNTER — Emergency Department
Admission: EM | Admit: 2021-08-16 | Discharge: 2021-08-16 | Disposition: A | Discharge status: Home / Self Care | Payer: Self-pay | Attending: Emergency Medicine | Admitting: Emergency Medicine

## 2021-08-16 ENCOUNTER — Emergency Department: Payer: Self-pay

## 2021-08-16 DIAGNOSIS — N898 Other specified noninflammatory disorders of vagina: Secondary | ICD-10-CM | POA: Insufficient documentation

## 2021-08-16 DIAGNOSIS — A599 Trichomoniasis, unspecified: Secondary | ICD-10-CM | POA: Insufficient documentation

## 2021-08-16 DIAGNOSIS — F17210 Nicotine dependence, cigarettes, uncomplicated: Secondary | ICD-10-CM | POA: Insufficient documentation

## 2021-08-16 LAB — URINALYSIS, ROUTINE W REFLEX MICROSCOPIC
Bilirubin Urine: NEGATIVE
Glucose, UA: NEGATIVE mg/dL
Ketones, ur: NEGATIVE mg/dL
Nitrite: NEGATIVE
Protein, ur: 30 mg/dL — AB
Specific Gravity, Urine: 1.02 (ref 1.005–1.030)
pH: 5 (ref 5.0–8.0)

## 2021-08-16 LAB — WET PREP, GENITAL
Clue Cells Wet Prep HPF POC: NONE SEEN
Sperm: NONE SEEN
Yeast Wet Prep HPF POC: NONE SEEN

## 2021-08-16 LAB — PREGNANCY, URINE: Preg Test, Ur: NEGATIVE

## 2021-08-16 LAB — CHLAMYDIA/NGC RT PCR (ARMC ONLY)
Chlamydia Tr: NOT DETECTED
N gonorrhoeae: NOT DETECTED

## 2021-08-16 MED ORDER — METRONIDAZOLE 500 MG PO TABS: 500.0000 mg | ORAL_TABLET | Freq: Three times a day (TID) | ORAL | 0 refills | Status: AC

## 2021-08-16 MED ORDER — OXYCODONE-ACETAMINOPHEN 5-325 MG PO TABS
1.0000 | ORAL_TABLET | Freq: Once | ORAL | Status: AC
Start: 2021-08-16 — End: 2021-08-16
  Administered 2021-08-16: 1 via ORAL
  Filled 2021-08-16: qty 1

## 2021-08-16 MED ORDER — TRAMADOL HCL 50 MG PO TABS: 50.0000 mg | ORAL_TABLET | Freq: Four times a day (QID) | ORAL | 0 refills | Status: DC | PRN

## 2021-08-16 NOTE — ED Triage Notes (Signed)
Pt comes with c/o 2-3 days of lower belly pain. Pt state some pain with urination and burning.

## 2021-08-16 NOTE — ED Notes (Signed)
Pt here for vaginal pain. Labs discontinued per [place], [place],
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
## 2021-08-16 NOTE — Discharge Instructions (Signed)
Follow-up with Dr. Dalbert Garnet or [facility] department.  Please call for an appointment. Your partner will also need to be treated.  Once both parties have been treated you should abstain from sex for an additional week to make sure you do not pass infection back-and-forth.

## 2021-08-16 NOTE — ED Triage Notes (Signed)
Pt states that she reported abd pain to the first nurse because she didn't want to say what she is really here for, pt states that she is having vaginal swelling and thinks she may have a yeast infection and states thought she bought some cream that isn't helping, pt also states that she is having left lower tooth pain

## 2021-09-10 ENCOUNTER — Encounter: Payer: Self-pay | Admitting: Family Medicine

## 2021-09-10 ENCOUNTER — Other Ambulatory Visit: Payer: Self-pay

## 2021-09-10 ENCOUNTER — Ambulatory Visit: Payer: Self-pay | Admitting: Family Medicine

## 2021-09-10 DIAGNOSIS — T7491XS Unspecified adult maltreatment, confirmed, sequela: Secondary | ICD-10-CM

## 2021-09-10 DIAGNOSIS — N730 Acute parametritis and pelvic cellulitis: Secondary | ICD-10-CM

## 2021-09-10 DIAGNOSIS — Z113 Encounter for screening for infections with a predominantly sexual mode of transmission: Secondary | ICD-10-CM

## 2021-09-10 DIAGNOSIS — Z0389 Encounter for observation for other suspected diseases and conditions ruled out: Secondary | ICD-10-CM | POA: Diagnosis not present

## 2021-09-10 DIAGNOSIS — Z1388 Encounter for screening for disorder due to exposure to contaminants: Secondary | ICD-10-CM | POA: Diagnosis not present

## 2021-09-10 DIAGNOSIS — B009 Herpesviral infection, unspecified: Secondary | ICD-10-CM

## 2021-09-10 DIAGNOSIS — Z299 Encounter for prophylactic measures, unspecified: Secondary | ICD-10-CM

## 2021-09-10 DIAGNOSIS — Z3009 Encounter for other general counseling and advice on contraception: Secondary | ICD-10-CM | POA: Diagnosis not present

## 2021-09-10 LAB — HM HEPATITIS C SCREENING LAB: HM Hepatitis Screen: NEGATIVE

## 2021-09-10 LAB — HEPATITIS B SURFACE ANTIGEN: Hepatitis B Surface Ag: NONREACTIVE

## 2021-09-10 LAB — HM HIV SCREENING LAB: HM HIV Screening: NEGATIVE

## 2021-09-10 MED ORDER — ACYCLOVIR 800 MG PO TABS: 800.0000 mg | ORAL_TABLET | Freq: Every day | ORAL | 12 refills | Status: DC

## 2021-09-10 MED ORDER — CLOTRIMAZOLE 1 % VA CREA: 1.0000 | TOPICAL_CREAM | Freq: Every day | VAGINAL | 0 refills | Status: AC

## 2021-09-10 MED ORDER — DOXYCYCLINE HYCLATE 100 MG PO TABS: 100.0000 mg | ORAL_TABLET | Freq: Two times a day (BID) | ORAL | 0 refills | Status: AC

## 2021-09-10 MED ORDER — METRONIDAZOLE 500 MG PO TABS: 500.0000 mg | ORAL_TABLET | Freq: Two times a day (BID) | ORAL | 0 refills | Status: AC

## 2021-09-10 MED ORDER — CEFTRIAXONE SODIUM 500 MG IJ SOLR
500.0000 mg | Freq: Once | INTRAMUSCULAR | Status: AC
Start: 2021-09-10 — End: 2021-09-10
  Administered 2021-09-10: 500 mg via INTRAMUSCULAR

## 2021-09-13 ENCOUNTER — Telehealth: Payer: Self-pay

## 2021-09-13 DIAGNOSIS — T7491XA Unspecified adult maltreatment, confirmed, initial encounter: Secondary | ICD-10-CM | POA: Insufficient documentation
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
## 2021-09-14 NOTE — Telephone Encounter (Signed)
No additional notes

## 2021-10-06 ENCOUNTER — Telehealth: Payer: Self-pay | Admitting: Family Medicine

## 2021-10-06 NOTE — Telephone Encounter (Signed)
Tric symptoms has recurred and she is feeling really sick wants to come speak with nurse.

## 2021-10-07 ENCOUNTER — Other Ambulatory Visit: Payer: Self-pay

## 2021-10-07 ENCOUNTER — Emergency Department
Admission: EM | Admit: 2021-10-07 | Discharge: 2021-10-07 | Disposition: A | Discharge status: Home / Self Care | Payer: Medicaid Other | Attending: Emergency Medicine | Admitting: Emergency Medicine

## 2021-10-07 DIAGNOSIS — F17210 Nicotine dependence, cigarettes, uncomplicated: Secondary | ICD-10-CM | POA: Insufficient documentation

## 2021-10-07 DIAGNOSIS — B3731 Acute candidiasis of vulva and vagina: Secondary | ICD-10-CM

## 2021-10-07 DIAGNOSIS — R102 Pelvic and perineal pain: Secondary | ICD-10-CM | POA: Insufficient documentation

## 2021-10-07 LAB — COMPREHENSIVE METABOLIC PANEL
ALT: 11 U/L (ref 0–44)
AST: 13 U/L — ABNORMAL LOW (ref 15–41)
Albumin: 3.9 g/dL (ref 3.5–5.0)
Alkaline Phosphatase: 60 U/L (ref 38–126)
Anion gap: 3 — ABNORMAL LOW (ref 5–15)
BUN: 12 mg/dL (ref 6–20)
CO2: 29 mmol/L (ref 22–32)
Calcium: 9.2 mg/dL (ref 8.9–10.3)
Chloride: 107 mmol/L (ref 98–111)
Creatinine, Ser: 0.73 mg/dL (ref 0.44–1.00)
GFR, Estimated: >60 mL/min (ref >60)
Glucose, Bld: 104 mg/dL — ABNORMAL HIGH (ref 70–99)
Potassium: 3.7 mmol/L (ref 3.5–5.1)
Sodium: 139 mmol/L (ref 135–145)
Total Bilirubin: 0.4 mg/dL (ref 0.3–1.2)
Total Protein: 6.7 g/dL (ref 6.5–8.1)

## 2021-10-07 LAB — URINALYSIS, COMPLETE (UACMP) WITH MICROSCOPIC
Bacteria, UA: NONE SEEN
Bilirubin Urine: NEGATIVE
Glucose, UA: NEGATIVE mg/dL
Ketones, ur: NEGATIVE mg/dL
Leukocytes,Ua: NEGATIVE
Nitrite: NEGATIVE
Protein, ur: NEGATIVE mg/dL
Specific Gravity, Urine: 1.015 (ref 1.005–1.030)
pH: 6 (ref 5.0–8.0)

## 2021-10-07 LAB — CHLAMYDIA/NGC RT PCR (ARMC ONLY)
Chlamydia Tr: NOT DETECTED
N gonorrhoeae: NOT DETECTED

## 2021-10-07 LAB — POC URINE PREG, ED: Preg Test, Ur: NEGATIVE

## 2021-10-07 LAB — WET PREP, GENITAL
Clue Cells Wet Prep HPF POC: NONE SEEN
Sperm: NONE SEEN
Trich, Wet Prep: NONE SEEN
WBC, Wet Prep HPF POC: <10 (ref <10)

## 2021-10-07 LAB — CBC
HCT: 37.8 % (ref 36.0–46.0)
Hemoglobin: 12.2 g/dL (ref 12.0–15.0)
MCH: 28.1 pg (ref 26.0–34.0)
MCHC: 32.3 g/dL (ref 30.0–36.0)
MCV: 87.1 fL (ref 80.0–100.0)
Platelets: 372 10*3/uL (ref 150–400)
RBC: 4.34 MIL/uL (ref 3.87–5.11)
RDW: 15.1 % (ref 11.5–15.5)
WBC: 7.9 10*3/uL (ref 4.0–10.5)
nRBC: 0 % (ref 0.0–0.2)

## 2021-10-07 MED ORDER — FLUCONAZOLE 50 MG PO TABS: 150.0000 mg | ORAL_TABLET | Freq: Once | ORAL | Status: DC

## 2021-10-07 MED ORDER — MORPHINE SULFATE (PF) 4 MG/ML IV SOLN
4.0000 mg | Freq: Once | INTRAVENOUS | Status: AC
  Administered 2021-10-07: 4 mg via INTRAMUSCULAR
  Filled 2021-10-07: qty 1

## 2021-10-07 MED ORDER — ONDANSETRON 4 MG PO TBDP
4.0000 mg | ORAL_TABLET | Freq: Once | ORAL | Status: AC
  Administered 2021-10-07: 4 mg via ORAL
  Filled 2021-10-07: qty 1
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
## 2021-10-07 NOTE — Discharge Instructions (Signed)
You have been treated in the emergency department today for a yeast infection.  Please follow-up with your doctor for recheck/reevaluation.  STD test have been sent today and will result on your care everywhere chart if they are positive we will call you to inform you and call you in an antibiotic.  Return to the emergency department for any worsening pain, fever or any other symptom personally concerning to yourself.

## 2021-10-07 NOTE — ED Triage Notes (Signed)
Patient was dx trichomonas last month. Patient c/o continued lower back pain radiating to pelvis.

## 2021-10-07 NOTE — ED Notes (Signed)
Pt observed walking down the hallway, pt asked if she were leaving, pt states, "no, I was just trying to sneak out to go smoke." Pt observed walking back to her room

## 2021-10-07 NOTE — ED Notes (Signed)
Pt back to exam room, pt was seen walking out

## 2021-10-07 NOTE — ED Notes (Signed)
Pt states she feels her pain coming back, but also still feels the effects of the morphine. Pt asked if MD is wanting to do any swabs or anything. This RN advised pt that MD does want to do a pelvic exam with swabs & will be back in to do exam, but is in with an emergency at the moment. No additional needs verbalized at this time. Bed low & locked; call light & personal items within reach.

## 2021-10-07 NOTE — ED Notes (Signed)
Pt left without d/c instructions or meds being given prior to d/c

## 2021-10-12 ENCOUNTER — Emergency Department
Admission: EM | Admit: 2021-10-12 | Discharge: 2021-10-12 | Disposition: A | Discharge status: Home / Self Care | Payer: Medicaid Other | Attending: Emergency Medicine | Admitting: Emergency Medicine

## 2021-10-12 ENCOUNTER — Other Ambulatory Visit: Payer: Self-pay

## 2021-10-12 ENCOUNTER — Encounter: Payer: Self-pay | Admitting: Emergency Medicine

## 2021-10-12 DIAGNOSIS — Z202 Contact with and (suspected) exposure to infections with a predominantly sexual mode of transmission: Secondary | ICD-10-CM | POA: Insufficient documentation

## 2021-10-12 DIAGNOSIS — F17210 Nicotine dependence, cigarettes, uncomplicated: Secondary | ICD-10-CM | POA: Insufficient documentation

## 2021-10-12 LAB — URINALYSIS, COMPLETE (UACMP) WITH MICROSCOPIC
Bacteria, UA: NONE SEEN
Bilirubin Urine: NEGATIVE
Glucose, UA: NEGATIVE mg/dL
Ketones, ur: NEGATIVE mg/dL
Nitrite: NEGATIVE
Protein, ur: NEGATIVE mg/dL
Specific Gravity, Urine: 1.016 (ref 1.005–1.030)
pH: 5 (ref 5.0–8.0)

## 2021-10-12 LAB — POC URINE PREG, ED: Preg Test, Ur: NEGATIVE

## 2021-10-12 LAB — CHLAMYDIA/NGC RT PCR (ARMC ONLY)
Chlamydia Tr: NOT DETECTED
N gonorrhoeae: NOT DETECTED

## 2021-10-12 LAB — WET PREP, GENITAL
Clue Cells Wet Prep HPF POC: NONE SEEN
Sperm: NONE SEEN
Trich, Wet Prep: NONE SEEN
WBC, Wet Prep HPF POC: >=10 — AB (ref <10)
Yeast Wet Prep HPF POC: NONE SEEN

## 2021-10-12 MED ORDER — AZITHROMYCIN 500 MG PO TABS
1000.0000 mg | ORAL_TABLET | Freq: Once | ORAL | Status: AC
  Administered 2021-10-12: 10:00:00 1000 mg via ORAL
  Filled 2021-10-12: qty 2

## 2021-10-12 MED ORDER — CEFTRIAXONE SODIUM 1 G IJ SOLR
500.0000 mg | Freq: Once | INTRAMUSCULAR | Status: AC
  Administered 2021-10-12: 10:00:00 500 mg via INTRAMUSCULAR
  Filled 2021-10-12: qty 10

## 2021-10-12 MED ORDER — FLUCONAZOLE 150 MG PO TABS
ORAL_TABLET | ORAL | 0 refills | Status: DC
  Filled 2021-10-12: qty 2, 7d supply, fill #0

## 2021-10-12 NOTE — ED Notes (Signed)
Pt is becoming very agitated and coming out of the room screaming and yelling about treatment when it was told her to that it was going to be given before D/C.  Security called.

## 2021-10-12 NOTE — ED Notes (Signed)
Patients came thinks she has a UTI and Boyfriend told he has Tric and other infection. She came to get checked out.

## 2021-10-12 NOTE — ED Triage Notes (Signed)
Pt here with symptoms of a UTI. Pt states that it is hard to go to the bathroom and she is having some STD issues with her current partner. Pt in no distress.

## 2021-10-12 NOTE — Discharge Instructions (Signed)
Follow-up with the [facility] department.  Please call for an appointment.  Take medication as prescribed.  Do not have sex with your current partner.  He should be treated and then wear condoms.  This will prevent STDs.
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
## 2021-11-09 ENCOUNTER — Ambulatory Visit: Payer: Self-pay | Admitting: Advanced Practice Midwife

## 2021-11-09 ENCOUNTER — Other Ambulatory Visit: Payer: Self-pay

## 2021-11-09 DIAGNOSIS — Z0389 Encounter for observation for other suspected diseases and conditions ruled out: Secondary | ICD-10-CM | POA: Diagnosis not present

## 2021-11-09 DIAGNOSIS — Z113 Encounter for screening for infections with a predominantly sexual mode of transmission: Secondary | ICD-10-CM

## 2021-11-09 DIAGNOSIS — Z1388 Encounter for screening for disorder due to exposure to contaminants: Secondary | ICD-10-CM | POA: Diagnosis not present

## 2021-11-09 DIAGNOSIS — F325 Major depressive disorder, single episode, in full remission: Secondary | ICD-10-CM | POA: Insufficient documentation

## 2021-11-09 DIAGNOSIS — N739 Female pelvic inflammatory disease, unspecified: Secondary | ICD-10-CM

## 2021-11-09 DIAGNOSIS — Z3009 Encounter for other general counseling and advice on contraception: Secondary | ICD-10-CM | POA: Diagnosis not present

## 2021-11-09 LAB — WET PREP FOR TRICH, YEAST, CLUE
Trichomonas Exam: NEGATIVE
Yeast Exam: NEGATIVE

## 2021-11-09 MED ORDER — DOXYCYCLINE HYCLATE 100 MG PO TABS
100.0000 mg | ORAL_TABLET | Freq: Two times a day (BID) | ORAL | 0 refills | Status: AC
Start: 2021-11-09 — End: 2021-11-16

## 2021-11-09 MED ORDER — CEFTRIAXONE SODIUM 500 MG IJ SOLR
500.0000 mg | Freq: Once | INTRAMUSCULAR | Status: AC
Start: 2021-11-09 — End: 2021-11-09
  Administered 2021-11-09: 500 mg via INTRAMUSCULAR

## 2021-11-09 MED ORDER — METRONIDAZOLE 500 MG PO TABS
500.0000 mg | ORAL_TABLET | Freq: Two times a day (BID) | ORAL | 0 refills | Status: AC
Start: 2021-11-09 — End: 2021-11-16
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
## 2021-11-09 NOTE — Progress Notes (Signed)
Pt here for STD screening.  Wet mount results reviewed.  Doxycycline 100 mg #14 and Metronidazole 500 mg  #14 given.  Ceftriaxone 500 mg given IM without any complications.  Order for Clotrimazole was not noticed until pt had left,  LM with pt's Aunt to have Zaylia return my call.  Pt declined condoms.  Berdie Ogren, RN

## 2021-11-10 ENCOUNTER — Telehealth: Payer: Self-pay | Admitting: Family Medicine

## 2021-11-10 NOTE — Telephone Encounter (Signed)
Pt is returning a missed call from yesterday. She will be at this phone number until about 4 or so this afternoon. [phone].

## 2021-11-13 LAB — GONOCOCCUS CULTURE

## 2021-11-17 ENCOUNTER — Telehealth: Payer: Self-pay | Admitting: Family Medicine

## 2021-11-22 ENCOUNTER — Telehealth: Payer: Self-pay | Admitting: Family Medicine

## 2021-11-22 NOTE — Telephone Encounter (Signed)
Pt states that she needs to speak to someone in the clinic about an std she has had.

## 2021-12-09 ENCOUNTER — Encounter: Payer: Self-pay | Admitting: Emergency Medicine

## 2021-12-09 ENCOUNTER — Other Ambulatory Visit: Payer: Self-pay

## 2021-12-09 ENCOUNTER — Emergency Department
Admission: EM | Admit: 2021-12-09 | Discharge: 2021-12-09 | Disposition: A | Discharge status: Home / Self Care | Payer: Medicaid Other | Attending: Emergency Medicine | Admitting: Emergency Medicine

## 2021-12-09 DIAGNOSIS — R0981 Nasal congestion: Secondary | ICD-10-CM | POA: Insufficient documentation

## 2021-12-09 DIAGNOSIS — Z20822 Contact with and (suspected) exposure to covid-19: Secondary | ICD-10-CM | POA: Insufficient documentation

## 2021-12-09 DIAGNOSIS — K047 Periapical abscess without sinus: Secondary | ICD-10-CM | POA: Insufficient documentation

## 2021-12-09 DIAGNOSIS — F17210 Nicotine dependence, cigarettes, uncomplicated: Secondary | ICD-10-CM | POA: Insufficient documentation

## 2021-12-09 LAB — RESP PANEL BY RT-PCR (FLU A&B, COVID) ARPGX2
Influenza A by PCR: NEGATIVE
Influenza B by PCR: NEGATIVE
SARS Coronavirus 2 by RT PCR: NEGATIVE

## 2021-12-09 MED ORDER — FLUTICASONE PROPIONATE 50 MCG/ACT NA SUSP
2.0000 | Freq: Every day | NASAL | 0 refills | Status: DC
  Filled 2021-12-09: qty 16, 30d supply, fill #0

## 2021-12-09 MED ORDER — AMOXICILLIN 875 MG PO TABS
875.0000 mg | ORAL_TABLET | Freq: Two times a day (BID) | ORAL | 0 refills | Status: DC
  Filled 2021-12-09: qty 20, 10d supply, fill #0

## 2021-12-09 NOTE — ED Notes (Signed)
See triage note  presents with sore throat and bilateral ear pain  unsure of fever at home   pt is afebrile on arrival  having increase dpain with swallowing

## 2021-12-09 NOTE — Discharge Instructions (Signed)
Call [facility] for [facility] your [facility].  A prescription for amoxicillin was sent to the pharmacy that you should complete entirely for the infection around your tooth.  Also the Flonase nasal sprays 2 sprays on each side of your nose once a day which will help with nasal congestion and posterior drainage.

## 2021-12-09 NOTE — ED Triage Notes (Signed)
Pt comes into the ED via POV c/o nasal congestion and Left lower side dental pain.  Pt has even and unlabored respirations at this time.

## 2021-12-09 NOTE — ED Provider Notes (Signed)
[facility] Provider Note    Event Date/Time   First MD Initiated Contact with Patient 12/09/21 1235     (approximate)   History   Nasal Congestion and Dental Pain   HPI  Briana Lyons is a 41 y.o. female presents to the ED with complaint of nasal congestion and also wants to have a tooth looked at that she believes is infected.  She has been going to the [facility] dental clinic and this is the last tooth that she has that needs to be pulled.  Patient denies any fever, chills, nausea or vomiting.  Patient has a history of depression, bipolar, cigarette use.     Physical Exam   Triage Vital Signs: ED Triage Vitals  Enc Vitals Group     BP 12/09/21 1226 (!) 144/88     Pulse Rate 12/09/21 1226 89     Resp 12/09/21 1226 18     Temp 12/09/21 1226 98.8 F (37.1 C)     Temp src --      SpO2 12/09/21 1226 97 %     Weight 12/09/21 1218 160 lb 0.9 oz (72.6 kg)     Height 12/09/21 1218 5\' 3"  (1.6 m)     Head Circumference --      Peak Flow --      Pain Score 12/09/21 1218 8     Pain Loc --      Pain Edu? --      Excl. in GC? --     Most recent vital signs: Vitals:   12/09/21 1226  BP: (!) 144/88  Pulse: 89  Resp: 18  Temp: 98.8 F (37.1 C)  SpO2: 97%     General: Awake, no distress.  Talkative CV:  Good peripheral perfusion.  Resp:  Normal effort.  Lungs are clear bilaterally. Abd:  No distention.  Other:  EACs and TMs are clear.  Posterior pharynx without erythema or tonsillar exudate.  Mild posterior drainage noted.  There is on the left lower anterior aspect 1 tooth that partially remains.  The gum surrounding this area is inflamed and tooth is extremely and poor repair.  No active drainage is noted around this.  There is some mild nasal congestion.  Neck is supple without cervical lymphadenopathy.   ED Results / Procedures / Treatments   Labs (all labs ordered are listed, but only abnormal results are displayed) Labs Reviewed   RESP PANEL BY RT-PCR (FLU A&B, COVID) [id number]      PROCEDURES:  Critical Care performed:   Procedures   MEDICATIONS ORDERED IN ED: Medications - No data to display   IMPRESSION / MDM / ASSESSMENT AND PLAN / ED COURSE  I reviewed the triage vital signs and the nursing notes.   Differential diagnosis includes, but is not limited to, viral URI, influenza, COVID, dental pain due to cavity, dental abscess, gingivitis, allergic rhinitis.  41 year old female presents to the ED with complaint of nasal congestion without known fever, chills, nausea or vomiting.  No known exposure to COVID or influenza.  She also is here for dental pain around the last tooth that she has left [facility].  This area is edematous and tender with tooth being in very poor repair and partially avulsed due to the large cavity.  No active drainage is noted.  Patient is strongly encouraged to follow-up with the dental clinic at [facility] for remaining dental work.  A prescription  for amoxicillin was sent to the pharmacy along with a prescription for Flonase nasal spray to help with her congestion and posterior drainage.  Patient is encouraged to drink lots of fluids and take Tylenol or ibuprofen as needed for pain.        FINAL CLINICAL IMPRESSION(S) / ED DIAGNOSES   Final diagnoses:  Dental abscess  Nasal congestion     Rx / DC Orders   ED Discharge Orders          Ordered    amoxicillin (AMOXIL) 875 MG tablet  2 times daily        12/09/21 1350    fluticasone (FLONASE) 50 MCG/ACT nasal spray  Daily        12/09/21 1350             Note:  This document was prepared using Dragon voice recognition software and may include unintentional dictation errors.   Tommi Rumps, PA-C 12/09/21 1401    Willy Eddy, MD 12/09/21 1407

## 2021-12-10 ENCOUNTER — Other Ambulatory Visit: Payer: Self-pay

## 2022-01-26 ENCOUNTER — Other Ambulatory Visit: Payer: Self-pay

## 2022-05-02 ENCOUNTER — Emergency Department: Payer: Medicaid Other

## 2022-05-02 ENCOUNTER — Other Ambulatory Visit: Payer: Self-pay

## 2022-05-02 ENCOUNTER — Emergency Department
Admission: EM | Admit: 2022-05-02 | Discharge: 2022-05-02 | Discharge status: Against Medical Advice | Payer: Medicaid Other | Attending: Emergency Medicine | Admitting: Emergency Medicine

## 2022-05-02 DIAGNOSIS — R110 Nausea: Secondary | ICD-10-CM | POA: Insufficient documentation

## 2022-05-02 DIAGNOSIS — Z5321 Procedure and treatment not carried out due to patient leaving prior to being seen by health care provider: Secondary | ICD-10-CM | POA: Insufficient documentation

## 2022-05-02 DIAGNOSIS — R0789 Other chest pain: Secondary | ICD-10-CM | POA: Insufficient documentation

## 2022-05-02 DIAGNOSIS — R300 Dysuria: Secondary | ICD-10-CM | POA: Insufficient documentation

## 2022-05-02 DIAGNOSIS — I10 Essential (primary) hypertension: Secondary | ICD-10-CM | POA: Insufficient documentation

## 2022-05-02 DIAGNOSIS — R1030 Lower abdominal pain, unspecified: Secondary | ICD-10-CM | POA: Insufficient documentation

## 2022-05-02 DIAGNOSIS — K0889 Other specified disorders of teeth and supporting structures: Secondary | ICD-10-CM | POA: Insufficient documentation

## 2022-05-02 LAB — COMPREHENSIVE METABOLIC PANEL
ALT: 13 U/L (ref 0–44)
AST: 16 U/L (ref 15–41)
Albumin: 4.2 g/dL (ref 3.5–5.0)
Alkaline Phosphatase: 61 U/L (ref 38–126)
Anion gap: 9 (ref 5–15)
BUN: 13 mg/dL (ref 6–20)
CO2: 28 mmol/L (ref 22–32)
Calcium: 9.1 mg/dL (ref 8.9–10.3)
Chloride: 101 mmol/L (ref 98–111)
Creatinine, Ser: 0.81 mg/dL (ref 0.44–1.00)
GFR, Estimated: >60 mL/min (ref >60)
Glucose, Bld: 108 mg/dL — ABNORMAL HIGH (ref 70–99)
Potassium: 3.3 mmol/L — ABNORMAL LOW (ref 3.5–5.1)
Sodium: 138 mmol/L (ref 135–145)
Total Bilirubin: 0.4 mg/dL (ref 0.3–1.2)
Total Protein: 7.2 g/dL (ref 6.5–8.1)

## 2022-05-02 LAB — CBC WITH DIFFERENTIAL/PLATELET
Abs Immature Granulocytes: 0.04 10*3/uL (ref 0.00–0.07)
Basophils Absolute: 0.1 10*3/uL (ref 0.0–0.1)
Basophils Relative: 1 %
Eosinophils Absolute: 0.3 10*3/uL (ref 0.0–0.5)
Eosinophils Relative: 3 %
HCT: 39.3 % (ref 36.0–46.0)
Hemoglobin: 12.2 g/dL (ref 12.0–15.0)
Immature Granulocytes: 0 %
Lymphocytes Relative: 30 %
Lymphs Abs: 2.9 10*3/uL (ref 0.7–4.0)
MCH: 28.4 pg (ref 26.0–34.0)
MCHC: 31 g/dL (ref 30.0–36.0)
MCV: 91.4 fL (ref 80.0–100.0)
Monocytes Absolute: 0.6 10*3/uL (ref 0.1–1.0)
Monocytes Relative: 6 %
Neutro Abs: 5.7 10*3/uL (ref 1.7–7.7)
Neutrophils Relative %: 60 %
Platelets: 412 10*3/uL — ABNORMAL HIGH (ref 150–400)
RBC: 4.3 MIL/uL (ref 3.87–5.11)
RDW: 13.5 % (ref 11.5–15.5)
WBC: 9.6 10*3/uL (ref 4.0–10.5)
nRBC: 0 % (ref 0.0–0.2)

## 2022-05-02 LAB — URINALYSIS, ROUTINE W REFLEX MICROSCOPIC
Bacteria, UA: NONE SEEN
Bilirubin Urine: NEGATIVE
Glucose, UA: NEGATIVE mg/dL
Ketones, ur: NEGATIVE mg/dL
Leukocytes,Ua: NEGATIVE
Nitrite: NEGATIVE
Protein, ur: NEGATIVE mg/dL
Specific Gravity, Urine: 1.006 (ref 1.005–1.030)
pH: 6 (ref 5.0–8.0)

## 2022-05-02 LAB — LIPASE, BLOOD: Lipase: 50 U/L (ref 11–51)

## 2022-05-02 LAB — TROPONIN I (HIGH SENSITIVITY): Troponin I (High Sensitivity): 4 ng/L (ref <18)

## 2022-05-02 NOTE — ED Provider Triage Note (Signed)
Emergency Medicine Provider Triage Evaluation Note  Briana Lyons , a 41 y.o. female  was evaluated in triage.  Pt complains of multiple medical complaints to include dental pain, lower abdominal pain, intermittent chest pain, hypertension.  Patient states that symptoms may have been going on for as long as 2 weeks..  Review of Systems  Positive: Left-sided dental pain, intermittent chest pain, lower abdominal pain, dysuria Negative: Fevers, chills, difficulty breathing or swallowing, shortness of breath, emesis, diarrhea  Physical Exam  BP (!) 167/90 (BP Location: Left Arm)   Pulse 89   Temp 98.6 F (37 C) (Oral)   Resp 18   Wt 86.2 kg   SpO2 97%   BMI 33.66 kg/m  Gen:   Awake, no distress   Resp:  Normal effort  MSK:   Moves extremities without difficulty  Other:  Mild diffuse lower abdominal pain without point specific tenderness  Medical Decision Making  Medically screening exam initiated at 7:59 PM.  Appropriate orders placed.  Briana Lyons was informed that the remainder of the evaluation will be completed by another provider, this initial triage assessment does not replace that evaluation, and the importance of remaining in the ED until their evaluation is complete.  Patient will have urinalysis, labs, chest x-ray, EKG   Briana Patches, PA-C 05/02/22 1959

## 2022-05-02 NOTE — ED Triage Notes (Signed)
Pt arrives with c/o ABD pain that started about 2 weeks ago. Per pt, she is been having some nausea. Pt also has c/o tooth pain on the lower left side.

## 2022-05-03 ENCOUNTER — Other Ambulatory Visit: Payer: Self-pay

## 2022-05-03 ENCOUNTER — Encounter: Payer: Self-pay | Admitting: Emergency Medicine

## 2022-05-03 ENCOUNTER — Emergency Department: Payer: Medicaid Other

## 2022-05-03 ENCOUNTER — Emergency Department
Admission: EM | Admit: 2022-05-03 | Discharge: 2022-05-03 | Disposition: A | Discharge status: Home / Self Care | Payer: Medicaid Other | Attending: Emergency Medicine | Admitting: Emergency Medicine

## 2022-05-03 DIAGNOSIS — R778 Other specified abnormalities of plasma proteins: Secondary | ICD-10-CM | POA: Insufficient documentation

## 2022-05-03 DIAGNOSIS — N739 Female pelvic inflammatory disease, unspecified: Secondary | ICD-10-CM | POA: Insufficient documentation

## 2022-05-03 DIAGNOSIS — I10 Essential (primary) hypertension: Secondary | ICD-10-CM | POA: Insufficient documentation

## 2022-05-03 DIAGNOSIS — N730 Acute parametritis and pelvic cellulitis: Secondary | ICD-10-CM

## 2022-05-03 LAB — CHLAMYDIA/NGC RT PCR (ARMC ONLY)
Chlamydia Tr: NOT DETECTED
N gonorrhoeae: NOT DETECTED

## 2022-05-03 LAB — CBC
HCT: 38.2 % (ref 36.0–46.0)
Hemoglobin: 12.1 g/dL (ref 12.0–15.0)
MCH: 28.1 pg (ref 26.0–34.0)
MCHC: 31.7 g/dL (ref 30.0–36.0)
MCV: 88.8 fL (ref 80.0–100.0)
Platelets: 399 10*3/uL (ref 150–400)
RBC: 4.3 MIL/uL (ref 3.87–5.11)
RDW: 13.3 % (ref 11.5–15.5)
WBC: 8.8 10*3/uL (ref 4.0–10.5)
nRBC: 0 % (ref 0.0–0.2)

## 2022-05-03 LAB — COMPREHENSIVE METABOLIC PANEL
ALT: 13 U/L (ref 0–44)
AST: 17 U/L (ref 15–41)
Albumin: 4 g/dL (ref 3.5–5.0)
Alkaline Phosphatase: 59 U/L (ref 38–126)
Anion gap: 8 (ref 5–15)
BUN: 13 mg/dL (ref 6–20)
CO2: 24 mmol/L (ref 22–32)
Calcium: 9.1 mg/dL (ref 8.9–10.3)
Chloride: 107 mmol/L (ref 98–111)
Creatinine, Ser: 0.64 mg/dL (ref 0.44–1.00)
GFR, Estimated: >60 mL/min (ref >60)
Glucose, Bld: 104 mg/dL — ABNORMAL HIGH (ref 70–99)
Potassium: 3.6 mmol/L (ref 3.5–5.1)
Sodium: 139 mmol/L (ref 135–145)
Total Bilirubin: 0.4 mg/dL (ref 0.3–1.2)
Total Protein: 7.1 g/dL (ref 6.5–8.1)

## 2022-05-03 LAB — URINALYSIS, ROUTINE W REFLEX MICROSCOPIC
Bilirubin Urine: NEGATIVE
Glucose, UA: NEGATIVE mg/dL
Ketones, ur: NEGATIVE mg/dL
Leukocytes,Ua: NEGATIVE
Nitrite: NEGATIVE
Protein, ur: NEGATIVE mg/dL
Specific Gravity, Urine: 1.01 (ref 1.005–1.030)
pH: 6 (ref 5.0–8.0)

## 2022-05-03 LAB — WET PREP, GENITAL
Sperm: NONE SEEN
Trich, Wet Prep: NONE SEEN
WBC, Wet Prep HPF POC: <10 (ref <10)
Yeast Wet Prep HPF POC: NONE SEEN

## 2022-05-03 LAB — LIPASE, BLOOD: Lipase: 168 U/L — ABNORMAL HIGH (ref 11–51)

## 2022-05-03 LAB — PREGNANCY, URINE
Preg Test, Ur: NEGATIVE
Preg Test, Ur: NEGATIVE

## 2022-05-03 MED ORDER — IOHEXOL 300 MG/ML  SOLN
100.0000 mL | Freq: Once | INTRAMUSCULAR | Status: AC | PRN
  Administered 2022-05-03: 100 mL via INTRAVENOUS

## 2022-05-03 MED ORDER — SODIUM CHLORIDE 0.9 % IV BOLUS
1000.0000 mL | Freq: Once | INTRAVENOUS | Status: AC
  Administered 2022-05-03: 1000 mL via INTRAVENOUS

## 2022-05-03 MED ORDER — LIDOCAINE HCL (PF) 1 % IJ SOLN
5.0000 mL | Freq: Once | INTRAMUSCULAR | Status: AC
  Administered 2022-05-03: 5 mL
  Filled 2022-05-03: qty 5

## 2022-05-03 MED ORDER — METRONIDAZOLE 500 MG PO TABS
500.0000 mg | ORAL_TABLET | Freq: Two times a day (BID) | ORAL | 0 refills | Status: DC
  Filled 2022-05-03: qty 28, 14d supply, fill #0

## 2022-05-03 MED ORDER — DOXYCYCLINE MONOHYDRATE 100 MG PO TABS: 100.0000 mg | ORAL_TABLET | Freq: Two times a day (BID) | ORAL | 0 refills | Status: AC

## 2022-05-03 MED ORDER — DOXYCYCLINE MONOHYDRATE 100 MG PO TABS
100.0000 mg | ORAL_TABLET | Freq: Two times a day (BID) | ORAL | 0 refills | Status: DC
  Filled 2022-05-03: qty 28, 14d supply, fill #0

## 2022-05-03 MED ORDER — CEFTRIAXONE SODIUM 1 G IJ SOLR
500.0000 mg | Freq: Once | INTRAMUSCULAR | Status: AC
  Administered 2022-05-03: 500 mg via INTRAMUSCULAR
  Filled 2022-05-03: qty 10

## 2022-05-03 MED ORDER — METRONIDAZOLE 500 MG PO TABS: 500.0000 mg | ORAL_TABLET | Freq: Two times a day (BID) | ORAL | 0 refills | Status: AC
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
## 2022-05-03 NOTE — ED Triage Notes (Addendum)
Patient to ED via POV for lower abd pain for approx 3 weeks and has difficulty having a BM. Patient state her blood pressure has also been high. Patient states she has started taking amoxicillin that she bought online.

## 2022-05-03 NOTE — Discharge Instructions (Signed)
Take doxycycline twice daily for 14 days. Take Flagyl twice daily for 14 days.

## 2022-05-03 NOTE — ED Notes (Signed)
See triage note  Presents with some abd pain for about 3 weeks.Denies any n/v  Afebrile on  arrival

## 2022-08-13 ENCOUNTER — Emergency Department: Payer: Self-pay

## 2022-08-13 ENCOUNTER — Emergency Department
Admission: EM | Admit: 2022-08-13 | Discharge: 2022-08-13 | Disposition: A | Discharge status: Home / Self Care | Payer: Self-pay | Attending: Emergency Medicine | Admitting: Emergency Medicine

## 2022-08-13 ENCOUNTER — Other Ambulatory Visit: Payer: Self-pay

## 2022-08-13 DIAGNOSIS — T07XXXA Unspecified multiple injuries, initial encounter: Secondary | ICD-10-CM

## 2022-08-13 DIAGNOSIS — M545 Low back pain, unspecified: Secondary | ICD-10-CM | POA: Insufficient documentation

## 2022-08-13 DIAGNOSIS — R519 Headache, unspecified: Secondary | ICD-10-CM | POA: Insufficient documentation

## 2022-08-13 DIAGNOSIS — M542 Cervicalgia: Secondary | ICD-10-CM | POA: Insufficient documentation

## 2022-08-13 LAB — POC URINE PREG, ED: Preg Test, Ur: NEGATIVE

## 2022-08-13 MED ORDER — ACETAMINOPHEN 325 MG PO TABS
650.0000 mg | ORAL_TABLET | Freq: Once | ORAL | Status: AC
  Administered 2022-08-13: 650 mg via ORAL
  Filled 2022-08-13: qty 2
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
## 2022-08-13 NOTE — ED Notes (Signed)
Pt c/o feeling "foggy". This RN discussed with Carolin Sicks, per Manuela Schwartz, scans negative, cleared for discharge. This RN explained to patient possible to have concussion, explained brain rest to patient for approx 1 week to relieve symptoms, also discussed return precautions with patient at this time. Pt states understanding at this time. NAD noted at this time.
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
## 2022-09-03 ENCOUNTER — Emergency Department
Admission: EM | Admit: 2022-09-03 | Discharge: 2022-09-03 | Disposition: A | Discharge status: Home / Self Care | Payer: Medicaid Other | Attending: Emergency Medicine | Admitting: Emergency Medicine

## 2022-09-03 ENCOUNTER — Other Ambulatory Visit: Payer: Self-pay

## 2022-09-03 ENCOUNTER — Emergency Department: Payer: Medicaid Other

## 2022-09-03 DIAGNOSIS — N12 Tubulo-interstitial nephritis, not specified as acute or chronic: Secondary | ICD-10-CM | POA: Insufficient documentation

## 2022-09-03 LAB — BASIC METABOLIC PANEL
Anion gap: 4 — ABNORMAL LOW (ref 5–15)
BUN: 8 mg/dL (ref 6–20)
CO2: 26 mmol/L (ref 22–32)
Calcium: 9 mg/dL (ref 8.9–10.3)
Chloride: 112 mmol/L — ABNORMAL HIGH (ref 98–111)
Creatinine, Ser: 0.81 mg/dL (ref 0.44–1.00)
GFR, Estimated: >60 mL/min (ref >60)
Glucose, Bld: 116 mg/dL — ABNORMAL HIGH (ref 70–99)
Potassium: 3.8 mmol/L (ref 3.5–5.1)
Sodium: 142 mmol/L (ref 135–145)

## 2022-09-03 LAB — CBC
HCT: 37.4 % (ref 36.0–46.0)
Hemoglobin: 12 g/dL (ref 12.0–15.0)
MCH: 28 pg (ref 26.0–34.0)
MCHC: 32.1 g/dL (ref 30.0–36.0)
MCV: 87.4 fL (ref 80.0–100.0)
Platelets: 380 10*3/uL (ref 150–400)
RBC: 4.28 MIL/uL (ref 3.87–5.11)
RDW: 14.2 % (ref 11.5–15.5)
WBC: 9.2 10*3/uL (ref 4.0–10.5)
nRBC: 0 % (ref 0.0–0.2)

## 2022-09-03 LAB — URINALYSIS, ROUTINE W REFLEX MICROSCOPIC
Bilirubin Urine: NEGATIVE
Glucose, UA: NEGATIVE mg/dL
Ketones, ur: NEGATIVE mg/dL
Leukocytes,Ua: NEGATIVE
Nitrite: POSITIVE — AB
Protein, ur: NEGATIVE mg/dL
Specific Gravity, Urine: 1.012 (ref 1.005–1.030)
pH: 7 (ref 5.0–8.0)

## 2022-09-03 LAB — WET PREP, GENITAL
Clue Cells Wet Prep HPF POC: NONE SEEN
Sperm: NONE SEEN
Trich, Wet Prep: NONE SEEN
WBC, Wet Prep HPF POC: <10 (ref <10)
Yeast Wet Prep HPF POC: NONE SEEN

## 2022-09-03 LAB — CHLAMYDIA/NGC RT PCR (ARMC ONLY)
Chlamydia Tr: NOT DETECTED
N gonorrhoeae: NOT DETECTED

## 2022-09-03 LAB — POC URINE PREG, ED: Preg Test, Ur: NEGATIVE

## 2022-09-03 MED ORDER — ONDANSETRON 4 MG PO TBDP
4.0000 mg | ORAL_TABLET | Freq: Three times a day (TID) | ORAL | 0 refills | Status: DC | PRN
  Filled 2022-09-03: qty 15, 5d supply, fill #0

## 2022-09-03 MED ORDER — ONDANSETRON 4 MG PO TBDP
4.0000 mg | ORAL_TABLET | Freq: Once | ORAL | Status: AC
  Administered 2022-09-03: 4 mg via ORAL
  Filled 2022-09-03: qty 1

## 2022-09-03 MED ORDER — HYDROCODONE-ACETAMINOPHEN 5-325 MG PO TABS
1.0000 | ORAL_TABLET | Freq: Four times a day (QID) | ORAL | 0 refills | Status: DC | PRN
  Filled 2022-09-03: qty 12, 3d supply, fill #0

## 2022-09-03 MED ORDER — SODIUM CHLORIDE 0.9 % IV BOLUS
1000.0000 mL | Freq: Once | INTRAVENOUS | Status: AC
  Administered 2022-09-03: 1000 mL via INTRAVENOUS

## 2022-09-03 MED ORDER — CEFDINIR 300 MG PO CAPS: 300.0000 mg | ORAL_CAPSULE | Freq: Two times a day (BID) | ORAL | 0 refills | Status: AC

## 2022-09-03 MED ORDER — ONDANSETRON 4 MG PO TBDP: 4.0000 mg | ORAL_TABLET | Freq: Three times a day (TID) | ORAL | 0 refills | Status: AC | PRN

## 2022-09-03 MED ORDER — SODIUM CHLORIDE 0.9 % IV SOLN
1.0000 g | Freq: Once | INTRAVENOUS | Status: AC
  Administered 2022-09-03: 1 g via INTRAVENOUS
  Filled 2022-09-03: qty 10

## 2022-09-03 MED ORDER — OXYCODONE-ACETAMINOPHEN 5-325 MG PO TABS
1.0000 | ORAL_TABLET | Freq: Once | ORAL | Status: AC
  Administered 2022-09-03: 1 via ORAL
  Filled 2022-09-03: qty 1

## 2022-09-03 MED ORDER — CEFDINIR 300 MG PO CAPS
300.0000 mg | ORAL_CAPSULE | Freq: Two times a day (BID) | ORAL | 0 refills | Status: DC
  Filled 2022-09-03: qty 14, 7d supply, fill #0

## 2022-09-03 MED ORDER — ONDANSETRON 4 MG PO TBDP
4.0000 mg | ORAL_TABLET | Freq: Three times a day (TID) | ORAL | 0 refills | Status: DC | PRN
  Filled 2022-09-03: qty 10, 4d supply, fill #0

## 2022-09-03 MED ORDER — HYDROCODONE-ACETAMINOPHEN 5-325 MG PO TABS: 1.0000 | ORAL_TABLET | Freq: Four times a day (QID) | ORAL | 0 refills | Status: AC | PRN

## 2022-09-03 NOTE — Discharge Instructions (Addendum)
Take Omnicef twice daily for seven days.  You can take Zofran every eight hours as needed for nausea.

## 2022-09-03 NOTE — ED Triage Notes (Signed)
Pt came in from home for flank pain x 2 days. Pt has taken AZO at home for the pain. Pt advised that has helped with the pain. Pt has burning when urination. Pt also is having some toothache she would like to have the doctor to look at as well that has been going on x 1 month.

## 2022-09-03 NOTE — ED Notes (Signed)
Patient declined discharge vital signs. 

## 2022-09-03 NOTE — ED Provider Notes (Signed)
[facility] Provider Note  Patient Contact: 5:09 PM (approximate)   History   Flank Pain   HPI  Briana Lyons is a 41 y.o. female with a history of prior STDs and urinary tract infections, presents to the emergency department with bilateral flank pain, dysuria and increased urinary frequency.  She reports that she has been symptomatic for at least a month but states that her symptoms have acutely worsened over the past week.  She has had some chills and nausea but no vomiting.  No chest pain, chest tightness or shortness of breath.      Physical Exam   Triage Vital Signs: ED Triage Vitals  Enc Vitals Group     BP 09/03/22 1530 130/85     Pulse Rate 09/03/22 1530 89     Resp 09/03/22 1530 16     Temp 09/03/22 1530 98.3 F (36.8 C)     Temp Source 09/03/22 1530 Oral     SpO2 09/03/22 1530 98 %     Weight 09/03/22 1533 206 lb (93.4 kg)     Height 09/03/22 1533 5\' 3"  (1.6 m)     Head Circumference --      Peak Flow --      Pain Score 09/03/22 1532 8     Pain Loc --      Pain Edu? --      Excl. in GC? --     Most recent vital signs: Vitals:   09/03/22 1530  BP: 130/85  Pulse: 89  Resp: 16  Temp: 98.3 F (36.8 C)  SpO2: 98%     General: Alert and in no acute distress. Eyes:  PERRL. EOMI. Head: No acute traumatic findings ENT:      Nose: No congestion/rhinnorhea.      Mouth/Throat: Mucous membranes are moist. Neck: No stridor. No cervical spine tenderness to palpation. Cardiovascular:  Good peripheral perfusion Respiratory: Normal respiratory effort without tachypnea or retractions. Lungs CTAB. Good air entry to the bases with no decreased or absent breath sounds. Gastrointestinal: Bowel sounds 4 quadrants. Soft and nontender to palpation. No guarding or rigidity. No palpable masses. No distention. No CVA tenderness. Musculoskeletal: Full range of motion to all extremities.  Neurologic:  No gross focal neurologic deficits are  appreciated.  Skin:   No rash noted Other:   ED Results / Procedures / Treatments   Labs (all labs ordered are listed, but only abnormal results are displayed) Labs Reviewed  URINALYSIS, ROUTINE W REFLEX MICROSCOPIC - Abnormal; Notable for the following components:      Result Value   Color, Urine AMBER (*)    APPearance CLEAR (*)    Hgb urine dipstick SMALL (*)    Nitrite POSITIVE (*)    Bacteria, UA RARE (*)    All other components within normal limits  BASIC METABOLIC PANEL - Abnormal; Notable for the following components:   Chloride 112 (*)    Glucose, Bld 116 (*)    Anion gap 4 (*)    All other components within normal limits  WET PREP, GENITAL  URINE CULTURE  CHLAMYDIA/NGC RT PCR ([facility] ONLY)            CBC  POC URINE PREG, ED       RADIOLOGY  I personally viewed and evaluated these images as part of my medical decision making, as well as reviewing the written report by the radiologist.  ED Provider Interpretation:.  Cholelithiasis without evidence of acute cholecystitis.  No nephrolithiasis.   PROCEDURES:  Critical Care performed: No  Procedures   MEDICATIONS ORDERED IN ED: Medications  sodium chloride 0.9 % bolus 1,000 mL (1,000 mLs Intravenous New Bag/Given 09/03/22 1802)  cefTRIAXone (ROCEPHIN) 1 g in sodium chloride 0.9 % 100 mL IVPB (1 g Intravenous New Bag/Given 09/03/22 1803)  oxyCODONE-acetaminophen (PERCOCET/ROXICET) 5-325 MG per tablet 1 tablet (1 tablet Oral Given 09/03/22 1805)  ondansetron (ZOFRAN-ODT) disintegrating tablet 4 mg (4 mg Oral Given 09/03/22 1805)     IMPRESSION / MDM / ASSESSMENT AND PLAN / ED COURSE  I reviewed the triage vital signs and the nursing notes.                              Assessment and plan Flank pain 41 year old female presents to the emergency department with acutely worsening flank pain for the past week.  Vital signs are reassuring at triage.  On exam, patient was alert, active and nontoxic-appearing.   She did have CVA tenderness bilaterally.  Urinalysis concerning for UTI with blood, nitrates and rare bacteria.  Urine culture in process.  Urine pregnancy test is negative.  CBC indicates normal white blood cell count.  BMP within reference range.  Will obtain GC and wet prep and will administer IV Rocephin with normal saline bolus and will reassess.  CT renal stone study in process.   CT renal stone study shows no evidence of nephrolithiasis.  Patient was discharged with a short course of Vicodin for pain.  Return precautions were given to return with new or worsening symptoms.  FINAL CLINICAL IMPRESSION(S) / ED DIAGNOSES   Final diagnoses:  Pyelonephritis     Rx / DC Orders   ED Discharge Orders          Ordered    cefdinir (OMNICEF) 300 MG capsule  2 times daily        09/03/22 1826    ondansetron (ZOFRAN-ODT) 4 MG disintegrating tablet  Every 8 hours PRN        09/03/22 1827             Note:  This document was prepared using Dragon voice recognition software and may include unintentional dictation errors.   Pia Mau [place], PA-C 09/03/22 Donella Stade, MD 09/03/22 2158

## 2022-09-04 ENCOUNTER — Other Ambulatory Visit: Payer: Self-pay

## 2022-09-05 ENCOUNTER — Other Ambulatory Visit: Payer: Self-pay

## 2022-09-05 LAB — URINE CULTURE: Culture: <10000 — AB

## 2022-09-25 ENCOUNTER — Other Ambulatory Visit: Payer: Self-pay

## 2022-09-25 ENCOUNTER — Emergency Department
Admission: EM | Admit: 2022-09-25 | Discharge: 2022-09-25 | Disposition: A | Discharge status: Home / Self Care | Payer: Medicaid Other | Attending: Emergency Medicine | Admitting: Emergency Medicine

## 2022-09-25 DIAGNOSIS — R300 Dysuria: Secondary | ICD-10-CM | POA: Diagnosis present

## 2022-09-25 DIAGNOSIS — N10 Acute tubulo-interstitial nephritis: Secondary | ICD-10-CM | POA: Diagnosis not present

## 2022-09-25 LAB — URINALYSIS, ROUTINE W REFLEX MICROSCOPIC
Bilirubin Urine: NEGATIVE
Glucose, UA: NEGATIVE mg/dL
Hgb urine dipstick: NEGATIVE
Ketones, ur: NEGATIVE mg/dL
Leukocytes,Ua: NEGATIVE
Nitrite: POSITIVE — AB
Protein, ur: NEGATIVE mg/dL
Specific Gravity, Urine: 1.013 (ref 1.005–1.030)
pH: 6 (ref 5.0–8.0)

## 2022-09-25 LAB — POC URINE PREG, ED: Preg Test, Ur: NEGATIVE

## 2022-09-25 MED ORDER — ACETAMINOPHEN 500 MG PO TABS
1000.0000 mg | ORAL_TABLET | Freq: Once | ORAL | Status: AC
  Administered 2022-09-25: 1000 mg via ORAL
  Filled 2022-09-25: qty 2

## 2022-09-25 MED ORDER — ONDANSETRON HCL 4 MG PO TABS: 4.0000 mg | ORAL_TABLET | Freq: Every day | ORAL | 1 refills | Status: DC | PRN

## 2022-09-25 MED ORDER — SULFAMETHOXAZOLE-TRIMETHOPRIM 800-160 MG PO TABS
1.0000 | ORAL_TABLET | Freq: Once | ORAL | Status: AC
  Administered 2022-09-25: 1 via ORAL
  Filled 2022-09-25: qty 1

## 2022-09-25 MED ORDER — OXYCODONE HCL 5 MG PO TABS: 5.0000 mg | ORAL_TABLET | Freq: Three times a day (TID) | ORAL | 0 refills | Status: DC | PRN

## 2022-09-25 MED ORDER — SULFAMETHOXAZOLE-TRIMETHOPRIM 800-160 MG PO TABS: 1.0000 | ORAL_TABLET | Freq: Two times a day (BID) | ORAL | 0 refills | Status: AC

## 2022-09-25 MED ORDER — IBUPROFEN 600 MG PO TABS
600.0000 mg | ORAL_TABLET | Freq: Once | ORAL | Status: AC
  Administered 2022-09-25: 600 mg via ORAL
  Filled 2022-09-25: qty 1

## 2022-09-25 NOTE — ED Triage Notes (Signed)
Pt reports thinks she has a UTI. Pt reports has had multiple ones in the past and this is the same. Pt states urinary urgency, frequency and back pain.

## 2022-09-25 NOTE — ED Provider Notes (Signed)
[facility] Provider Note    Event Date/Time   First MD Initiated Contact with Patient 09/25/22 1105     (approximate)   History   Urinary Frequency, Dysuria, and Back Pain   HPI  Briana Lyons is a 41 y.o. female   Past medical history of recurrent urinary tract infections who presents to the emergency department with dysuria and frequency for last several days.  She also has some left flank pain.  Pain is constant and mild.  No systemic symptoms of fevers or chills, mild nausea no vomiting.  No other medical complaints.  No vaginal discharge.      History was obtained via the patient. Review of medical notes shows an appointment 09/03/2022 where STD testing was negative for GC and chlamydia.  CT renal showed no stones     Physical Exam   Triage Vital Signs: ED Triage Vitals  Enc Vitals Group     BP 09/25/22 1001 (!) 145/87     Pulse Rate 09/25/22 1001 86     Resp 09/25/22 1001 18     Temp 09/25/22 1001 97.8 F (36.6 C)     Temp Source 09/25/22 1001 Oral     SpO2 09/25/22 1001 95 %     Weight 09/25/22 0959 205 lb 0.4 oz (93 kg)     Height 09/25/22 0959 5\' 3"  (1.6 m)     Head Circumference --      Peak Flow --      Pain Score 09/25/22 0959 8     Pain Loc --      Pain Edu? --      Excl. in GC? --     Most recent vital signs: Vitals:   09/25/22 1001  BP: (!) 145/87  Pulse: 86  Resp: 18  Temp: 97.8 F (36.6 C)  SpO2: 95%    General: Awake, no distress.  CV:  Good peripheral perfusion.  Resp:  Normal effort.  Abd:  No distention.  Other:  Awake alert pleasant cooperative nontoxic-appearing with nontender abdomen exam   ED Results / Procedures / Treatments   Labs (all labs ordered are listed, but only abnormal results are displayed) Labs Reviewed  URINALYSIS, ROUTINE W REFLEX MICROSCOPIC - Abnormal; Notable for the following components:      Result Value   Color, Urine AMBER (*)    APPearance HAZY (*)    Nitrite POSITIVE  (*)    Bacteria, UA RARE (*)    All other components within normal limits  POC URINE PREG, ED     I reviewed labs and they are notable for nitrite positive with bacteria in the urine   PROCEDURES:  Critical Care performed: No  Procedures   MEDICATIONS ORDERED IN ED: Medications  sulfamethoxazole-trimethoprim (BACTRIM DS) 800-160 MG per tablet 1 tablet (1 tablet Oral Given 09/25/22 1122)    IMPRESSION / MDM / ASSESSMENT AND PLAN / ED COURSE  I reviewed the triage vital signs and the nursing notes.                              Differential diagnosis includes, but is not limited to, tract infection, pyelonephritis, renal colic, nephrolithiasis, intra-abdominal infection, pelvic infection, pregnancy related   MDM: Patient with evidence of urinary tract infection on urinalysis and exam and symptoms consistent with the same.  Nontoxic-appearing no systemic symptoms doubt sepsis.  She does have some back pain so we  will treat with pyelonephritis course of antibiotics first in the emergency department.  Outpatient treatment and PMD follow-up. Patient's presentation is most consistent with acute presentation with potential threat to life or bodily function.       FINAL CLINICAL IMPRESSION(S) / ED DIAGNOSES   Final diagnoses:  Acute pyelonephritis     Rx / DC Orders   ED Discharge Orders          Ordered    sulfamethoxazole-trimethoprim (BACTRIM DS) 800-160 MG tablet  2 times daily        09/25/22 1126    ondansetron (ZOFRAN) 4 MG tablet  Daily PRN        09/25/22 1126    oxyCODONE (ROXICODONE) 5 MG immediate release tablet  Every 8 hours PRN        09/25/22 1126             Note:  This document was prepared using Dragon voice recognition software and may include unintentional dictation errors.    Pilar Jarvis, MD 09/25/22 [id number]

## 2022-09-25 NOTE — Discharge Instructions (Addendum)
Take antibiotics.   Thank you for choosing [place] for your health care today!  Please see your primary doctor this week for a follow up appointment.   If you do not have a primary doctor call the following clinics to establish care:  If you have insurance:  [facility] [phone] [address] [place]., [place] [address]   [facility]  [phone] [address] [place]., [place] [address]   If you do not have insurance:  [facility]  [phone] [address] [place] [address]  Sometimes, in the early stages of certain disease courses it is difficult to detect in the emergency department evaluation -- so, it is important that you continue to monitor your symptoms and call your doctor right away or return to the emergency department if you develop any new or worsening symptoms.  It was my pleasure to care for you today.   Daneil Dan Modesto Charon, MD

## 2022-10-30 ENCOUNTER — Emergency Department
Admission: EM | Admit: 2022-10-30 | Discharge: 2022-10-30 | Disposition: A | Discharge status: Home / Self Care | Payer: Medicaid Other | Attending: Emergency Medicine | Admitting: Emergency Medicine

## 2022-10-30 ENCOUNTER — Other Ambulatory Visit: Payer: Self-pay

## 2022-10-30 DIAGNOSIS — K0889 Other specified disorders of teeth and supporting structures: Secondary | ICD-10-CM | POA: Diagnosis not present

## 2022-10-30 DIAGNOSIS — K029 Dental caries, unspecified: Secondary | ICD-10-CM | POA: Insufficient documentation

## 2022-10-30 MED ORDER — LIDOCAINE-EPINEPHRINE 2 %-1:100000 IJ SOLN
1.7000 mL | Freq: Once | INTRAMUSCULAR | Status: AC
  Administered 2022-10-30: 1.7 mL via INTRADERMAL
  Filled 2022-10-30: qty 1.7

## 2022-10-30 MED ORDER — PENICILLIN V POTASSIUM 500 MG PO TABS: 500.0000 mg | ORAL_TABLET | Freq: Four times a day (QID) | ORAL | 0 refills | Status: DC

## 2022-10-30 MED ORDER — LIDOCAINE HCL (PF) 1 % IJ SOLN
INTRAMUSCULAR | Status: AC
  Filled 2022-10-30: qty 5

## 2022-10-30 NOTE — Discharge Instructions (Addendum)
Pain control:  Ibuprofen (motrin/aleve/advil) - You can take 3-4 tablets (600-800 mg) every 6 hours as needed for pain/fever.  Acetaminophen (tylenol) - You can take 2 extra strength tablets (1000 mg) every 6 hours as needed for pain/fever.  You can alternate these medications or take them together.  Make sure you eat food/drink water when taking these medications.  Please go to the following website to schedule new (and existing) patient appointments:   [email or web address]   The following is a list of primary care offices in the area who are accepting new patients at this time.  Please reach out to one of them directly and let them know you would like to schedule an appointment to follow up on an Emergency Department visit, and/or to establish a new primary care provider (PCP).  There are likely other primary care clinics in the are who are accepting new patients, but this is an excellent place to start:  [place] physician: Dr Lavon Paganini [address] [place], [place] [address] [phone]  [facility] Lead Physician: Dr Steele Sizer [address], [place], [place] [address] [phone]  [place] Physician: Dr Park Liter [address] [place], [place] [address] [phone]  [facility] Lead Physician: Dr Dewaine Oats [address], [place], [place] [address] [phone]  [place] [facility] [place] Physician: Dr Halina Maidens [address] [place], [place],  [address] [phone]

## 2022-10-30 NOTE — ED Triage Notes (Signed)
Pt reports toothache to front left lower jaw and abscess for 2 days. Pt reports has not been able to get to the dentist.

## 2022-10-30 NOTE — ED Provider Notes (Signed)
[facility] Provider Note    Event Date/Time   First MD Initiated Contact with Patient 10/30/22 0800     (approximate)   History   Dental Pain   HPI  Briana Lyons is a 42 y.o. female presents to the emergency department with dental pain.  Left lower dental pain that started yesterday.  States that she has not yet followed up with a dentist.  History of multiple teeth were pulled in the past.  No fever or chills.  No difficulty swallowing.     Physical Exam   Triage Vital Signs: ED Triage Vitals  Enc Vitals Group     BP 10/30/22 0750 (!) 161/101     Pulse Rate 10/30/22 0750 79     Resp 10/30/22 0750 20     Temp 10/30/22 0750 98.9 F (37.2 C)     Temp Source 10/30/22 0750 Oral     SpO2 10/30/22 0750 96 %     Weight 10/30/22 0746 207 lb 3.7 oz (94 kg)     Height 10/30/22 0746 5\' 3"  (1.6 m)     Head Circumference --      Peak Flow --      Pain Score 10/30/22 0746 9     Pain Loc --      Pain Edu? --      Excl. in [place]? --     Most recent vital signs: Vitals:   10/30/22 0750  BP: (!) 161/101  Pulse: 79  Resp: 20  Temp: 98.9 F (37.2 C)  SpO2: 96%    Physical Exam Constitutional:      Appearance: She is well-developed.  HENT:     Head: Atraumatic.     Mouth/Throat:     Dentition: Gingival swelling and dental caries present. No dental abscesses.   Eyes:     Conjunctiva/sclera: Conjunctivae normal.  Cardiovascular:     Rate and Rhythm: Regular rhythm.  Pulmonary:     Effort: No respiratory distress.  Abdominal:     General: There is no distension.  Musculoskeletal:        General: Normal range of motion.     Cervical back: Normal range of motion.  Skin:    General: Skin is warm.  Neurological:     Mental Status: She is alert. Mental status is at baseline.      IMPRESSION / MDM / ASSESSMENT AND PLAN / ED COURSE  I reviewed the triage vital signs and the nursing notes.  Clinical picture can distant with dental caries.  No  signs of a Ludwick's.  No significant trismus.  Tolerating her secretions.  No signs of a deep space infection.  Dental block provided for analgesia.  Will start the patient on penicillin for possible pulpitis.  Discussed symptomatic treatment with Motrin and Tylenol.  Given low-cost dentist in the area.  Given information to follow-up with her primary care provider.   Labs (all labs ordered are listed, but only abnormal results are displayed) Labs interpreted as -    Labs Reviewed - No data to display       PROCEDURES:  Critical Care performed: No  Dental Block  Date/Time: 10/30/2022 8:51 AM  Performed by: 12/29/2022, MD Authorized by: Corena Herter, MD   Consent:    Consent obtained:  Verbal   Consent given by:  Patient   Risks, benefits, and alternatives were discussed: yes     Risks discussed:  Unsuccessful block, swelling, intravascular injection, hematoma and  allergic reaction   Alternatives discussed:  Alternative treatment Universal protocol:    Patient identity confirmed:  Verbally with patient Indications:    Indications: dental pain   Location:    Block type:  Inferior alveolar   Laterality:  Left Procedure details:    Syringe type:  Luer lock syringe   Needle gauge:  27 G   Anesthetic injected:  Bupivacaine 0.25% WITH epi   Injection procedure:  Anatomic landmarks identified Post-procedure details:    Outcome:  Anesthesia achieved   Procedure completion:  Tolerated well, no immediate complications   Patient's presentation is most consistent with acute illness / injury with system symptoms.   MEDICATIONS ORDERED IN ED: Medications  lidocaine-EPINEPHrine (XYLOCAINE W/EPI) 2 %-1:100000 (with pres) injection 1.7 mL (1.7 mLs Intradermal Given 10/30/22 0843)    FINAL CLINICAL IMPRESSION(S) / ED DIAGNOSES   Final diagnoses:  Pain, dental     Rx / DC Orders   ED Discharge Orders          Ordered    penicillin v potassium (VEETID) 500 MG tablet   4 times daily        10/30/22 0840             Note:  This document was prepared using Dragon voice recognition software and may include unintentional dictation errors.   Nathaniel Man, MD 10/30/22 [phone]

## 2022-11-05 ENCOUNTER — Emergency Department
Admission: EM | Admit: 2022-11-05 | Discharge: 2022-11-05 | Disposition: A | Discharge status: Home / Self Care | Payer: Medicaid Other | Attending: Emergency Medicine | Admitting: Emergency Medicine

## 2022-11-05 ENCOUNTER — Emergency Department: Payer: Medicaid Other

## 2022-11-05 ENCOUNTER — Other Ambulatory Visit: Payer: Self-pay

## 2022-11-05 DIAGNOSIS — N760 Acute vaginitis: Secondary | ICD-10-CM | POA: Diagnosis not present

## 2022-11-05 DIAGNOSIS — D72829 Elevated white blood cell count, unspecified: Secondary | ICD-10-CM | POA: Diagnosis not present

## 2022-11-05 DIAGNOSIS — B9689 Other specified bacterial agents as the cause of diseases classified elsewhere: Secondary | ICD-10-CM | POA: Insufficient documentation

## 2022-11-05 DIAGNOSIS — R300 Dysuria: Secondary | ICD-10-CM | POA: Diagnosis present

## 2022-11-05 DIAGNOSIS — R059 Cough, unspecified: Secondary | ICD-10-CM | POA: Diagnosis not present

## 2022-11-05 LAB — BASIC METABOLIC PANEL
Anion gap: 5 (ref 5–15)
BUN: 13 mg/dL (ref 6–20)
CO2: 27 mmol/L (ref 22–32)
Calcium: 9.3 mg/dL (ref 8.9–10.3)
Chloride: 108 mmol/L (ref 98–111)
Creatinine, Ser: 0.69 mg/dL (ref 0.44–1.00)
GFR, Estimated: >60 mL/min (ref >60)
Glucose, Bld: 94 mg/dL (ref 70–99)
Potassium: 4.2 mmol/L (ref 3.5–5.1)
Sodium: 140 mmol/L (ref 135–145)

## 2022-11-05 LAB — URINALYSIS, ROUTINE W REFLEX MICROSCOPIC
Bacteria, UA: NONE SEEN
Bilirubin Urine: NEGATIVE
Glucose, UA: NEGATIVE mg/dL
Hgb urine dipstick: NEGATIVE
Ketones, ur: NEGATIVE mg/dL
Nitrite: POSITIVE — AB
Protein, ur: NEGATIVE mg/dL
Specific Gravity, Urine: 1.015 (ref 1.005–1.030)
pH: 6 (ref 5.0–8.0)

## 2022-11-05 LAB — CBC WITH DIFFERENTIAL/PLATELET
Abs Immature Granulocytes: 0.02 10*3/uL (ref 0.00–0.07)
Basophils Absolute: 0 10*3/uL (ref 0.0–0.1)
Basophils Relative: 1 %
Eosinophils Absolute: 0.2 10*3/uL (ref 0.0–0.5)
Eosinophils Relative: 3 %
HCT: 41.5 % (ref 36.0–46.0)
Hemoglobin: 13 g/dL (ref 12.0–15.0)
Immature Granulocytes: 0 %
Lymphocytes Relative: 33 %
Lymphs Abs: 1.9 10*3/uL (ref 0.7–4.0)
MCH: 28 pg (ref 26.0–34.0)
MCHC: 31.3 g/dL (ref 30.0–36.0)
MCV: 89.4 fL (ref 80.0–100.0)
Monocytes Absolute: 0.5 10*3/uL (ref 0.1–1.0)
Monocytes Relative: 9 %
Neutro Abs: 3.2 10*3/uL (ref 1.7–7.7)
Neutrophils Relative %: 54 %
Platelets: 354 10*3/uL (ref 150–400)
RBC: 4.64 MIL/uL (ref 3.87–5.11)
RDW: 13.4 % (ref 11.5–15.5)
WBC: 5.9 10*3/uL (ref 4.0–10.5)
nRBC: 0 % (ref 0.0–0.2)

## 2022-11-05 LAB — POC URINE PREG, ED: Preg Test, Ur: NEGATIVE

## 2022-11-05 LAB — WET PREP, GENITAL
Sperm: NONE SEEN
Trich, Wet Prep: NONE SEEN
WBC, Wet Prep HPF POC: <10 (ref <10)
Yeast Wet Prep HPF POC: NONE SEEN

## 2022-11-05 MED ORDER — METRONIDAZOLE 500 MG PO TABS: 500.0000 mg | ORAL_TABLET | Freq: Two times a day (BID) | ORAL | 0 refills | Status: DC

## 2022-11-05 MED ORDER — FLUCONAZOLE 150 MG PO TABS: 150.0000 mg | ORAL_TABLET | Freq: Every day | ORAL | 0 refills | Status: DC

## 2022-11-05 NOTE — ED Notes (Addendum)
Pt dc to home, self care, pt states she is taking an Surveyor, mining

## 2022-11-05 NOTE — Discharge Instructions (Addendum)
Follow-up with your primary care provider if any continued problems or concerns.  A prescription for Flagyl was sent to the pharmacy which is the antibiotic to take care of your bacterial vaginosis.  You will need to take the entire 7 days until completely finished.  A prescription for Diflucan was also sent to the pharmacy if you develop a yeast infection while taking the antibiotic.

## 2022-11-05 NOTE — ED Notes (Signed)
Patient transported to X-ray 

## 2022-11-05 NOTE — ED Triage Notes (Signed)
Pt states that she started having dysuria 2 days ago- pt has had some cold symptoms

## 2022-11-05 NOTE — ED Provider Notes (Signed)
[facility] Provider Note    None    (approximate)   History   Dysuria   HPI  Briana Lyons is a 42 y.o. female presents to the ED with complaint of dysuria that started 2 days ago.  Patient also has had some cold symptoms but believes that this is getting better.  She is still concerned about some "congestion in her chest".  Patient also reports a vaginal discharge.  She denies any fever, chills, nausea or vomiting.     Physical Exam   Triage Vital Signs: ED Triage Vitals  Enc Vitals Group     BP 11/05/22 1009 (!) 162/90     Pulse Rate 11/05/22 0959 83     Resp 11/05/22 0959 18     Temp 11/05/22 0959 99.3 F (37.4 C)     Temp Source 11/05/22 0959 Oral     SpO2 11/05/22 0959 97 %     Weight --      Height --      Head Circumference --      Peak Flow --      Pain Score 11/05/22 1002 0     Pain Loc --      Pain Edu? --      Excl. in [place]? --     Most recent vital signs: Vitals:   11/05/22 1009 11/05/22 1238  BP: (!) 162/90 (!) 142/92  Pulse:  74  Resp:  16  Temp:  97.9 F (36.6 C)  SpO2:  99%     General: Awake, no distress.  CV:  Good peripheral perfusion.  Resp:  Normal effort.  Lungs are clear bilaterally. Abd:  No distention.  No CVA tenderness. Other:  EACs and TMs are clear bilaterally.  No flank tenderness.   ED Results / Procedures / Treatments   Labs (all labs ordered are listed, but only abnormal results are displayed) Labs Reviewed  WET PREP, GENITAL - Abnormal; Notable for the following components:      Result Value   Clue Cells Wet Prep HPF POC PRESENT (*)    All other components within normal limits  URINALYSIS, ROUTINE W REFLEX MICROSCOPIC - Abnormal; Notable for the following components:   Color, Urine AMBER (*)    APPearance CLEAR (*)    Nitrite POSITIVE (*)    Leukocytes,Ua TRACE (*)    All other components within normal limits  CBC WITH DIFFERENTIAL/PLATELET  BASIC METABOLIC PANEL  POC URINE PREG, ED    Radiology Chest x-ray images were reviewed by myself independent of the radiologist and was negative.  PROCEDURES:  Critical Care performed:   Procedures   MEDICATIONS ORDERED IN ED: Medications - No data to display   IMPRESSION / MDM / [place] / ED COURSE  I reviewed the triage vital signs and the nursing notes.   Differential diagnosis includes, but is not limited to, urinary tract infection, cystitis, bacterial vaginosis.  42 year old female presents to the ED with complaint of dysuria for the last 2 days and noted vaginal discharge.  Urinalysis showed trace leukocytes without bacteria, pregnancy test was negative, CBC and BMP unremarkable.  Wet prep did show clue cells and patient was made aware that a bacterial vaginosis can give similar symptoms to a urinary tract infection.  A prescription for Flagyl was sent to the pharmacy with instructions to take daily for the next 7 days.  Patient also requested a prescription for Diflucan in case she gets yeast infection.  She is to follow-up with her PCP if any continued problems or concerns.      Patient's presentation is most consistent with acute complicated illness / injury requiring diagnostic workup.  FINAL CLINICAL IMPRESSION(S) / ED DIAGNOSES   Final diagnoses:  BV (bacterial vaginosis)     Rx / DC Orders   ED Discharge Orders          Ordered    metroNIDAZOLE (FLAGYL) 500 MG tablet  2 times daily        11/05/22 1211    fluconazole (DIFLUCAN) 150 MG tablet  Daily        11/05/22 1213             Note:  This document was prepared using Dragon voice recognition software and may include unintentional dictation errors.   Johnn Hai, PA-C 11/05/22 1254    Nance Pear, MD 11/05/22 [phone]

## 2022-12-02 ENCOUNTER — Telehealth: Payer: Self-pay

## 2022-12-02 NOTE — Telephone Encounter (Signed)
Mychart msg sent. AS, CMA

## 2022-12-14 ENCOUNTER — Encounter: Payer: Self-pay | Admitting: Emergency Medicine

## 2022-12-14 ENCOUNTER — Telehealth: Payer: Self-pay | Admitting: Emergency Medicine

## 2022-12-14 ENCOUNTER — Other Ambulatory Visit: Payer: Self-pay

## 2022-12-14 ENCOUNTER — Emergency Department
Admission: EM | Admit: 2022-12-14 | Discharge: 2022-12-14 | Disposition: A | Discharge status: Home / Self Care | Payer: Medicaid Other | Attending: Emergency Medicine | Admitting: Emergency Medicine

## 2022-12-14 DIAGNOSIS — R1030 Lower abdominal pain, unspecified: Secondary | ICD-10-CM

## 2022-12-14 DIAGNOSIS — N3000 Acute cystitis without hematuria: Secondary | ICD-10-CM | POA: Diagnosis not present

## 2022-12-14 DIAGNOSIS — R350 Frequency of micturition: Secondary | ICD-10-CM | POA: Diagnosis present

## 2022-12-14 LAB — HIV ANTIBODY (ROUTINE TESTING W REFLEX): HIV Screen 4th Generation wRfx: NONREACTIVE

## 2022-12-14 LAB — COMPREHENSIVE METABOLIC PANEL
ALT: 12 U/L (ref 0–44)
AST: 18 U/L (ref 15–41)
Albumin: 3.8 g/dL (ref 3.5–5.0)
Alkaline Phosphatase: 63 U/L (ref 38–126)
Anion gap: 8 (ref 5–15)
BUN: 9 mg/dL (ref 6–20)
CO2: 23 mmol/L (ref 22–32)
Calcium: 8.8 mg/dL — ABNORMAL LOW (ref 8.9–10.3)
Chloride: 104 mmol/L (ref 98–111)
Creatinine, Ser: 0.77 mg/dL (ref 0.44–1.00)
GFR, Estimated: >60 mL/min (ref >60)
Glucose, Bld: 124 mg/dL — ABNORMAL HIGH (ref 70–99)
Potassium: 4 mmol/L (ref 3.5–5.1)
Sodium: 135 mmol/L (ref 135–145)
Total Bilirubin: 0.3 mg/dL (ref 0.3–1.2)
Total Protein: 6.9 g/dL (ref 6.5–8.1)

## 2022-12-14 LAB — URINALYSIS, ROUTINE W REFLEX MICROSCOPIC
Bacteria, UA: NONE SEEN
RBC / HPF: >50 RBC/hpf (ref 0–5)
Specific Gravity, Urine: 1.028 (ref 1.005–1.030)

## 2022-12-14 LAB — CBC
HCT: 38.7 % (ref 36.0–46.0)
Hemoglobin: 12.4 g/dL (ref 12.0–15.0)
MCH: 28.8 pg (ref 26.0–34.0)
MCHC: 32 g/dL (ref 30.0–36.0)
MCV: 89.8 fL (ref 80.0–100.0)
Platelets: 385 10*3/uL (ref 150–400)
RBC: 4.31 MIL/uL (ref 3.87–5.11)
RDW: 14.1 % (ref 11.5–15.5)
WBC: 9.3 10*3/uL (ref 4.0–10.5)
nRBC: 0 % (ref 0.0–0.2)

## 2022-12-14 LAB — CHLAMYDIA/NGC RT PCR (ARMC ONLY)
Chlamydia Tr: NOT DETECTED
N gonorrhoeae: DETECTED — AB

## 2022-12-14 LAB — WET PREP, GENITAL
Clue Cells Wet Prep HPF POC: NONE SEEN
Sperm: NONE SEEN
Trich, Wet Prep: NONE SEEN
WBC, Wet Prep HPF POC: <10 (ref <10)
Yeast Wet Prep HPF POC: NONE SEEN

## 2022-12-14 LAB — LIPASE, BLOOD: Lipase: 39 U/L (ref 11–51)

## 2022-12-14 MED ORDER — ONDANSETRON 4 MG PO TBDP: 4.0000 mg | ORAL_TABLET | Freq: Three times a day (TID) | ORAL | 0 refills | Status: DC | PRN

## 2022-12-14 MED ORDER — DOXYCYCLINE HYCLATE 100 MG PO CAPS
100.0000 mg | ORAL_CAPSULE | Freq: Two times a day (BID) | ORAL | 0 refills | Status: AC
  Filled 2022-12-14: qty 28, 14d supply, fill #0

## 2022-12-14 MED ORDER — ONDANSETRON 4 MG PO TBDP
4.0000 mg | ORAL_TABLET | Freq: Three times a day (TID) | ORAL | 0 refills | Status: DC | PRN
  Filled 2022-12-14: qty 18, 21d supply, fill #0

## 2022-12-14 MED ORDER — IBUPROFEN 600 MG PO TABS: 600.0000 mg | ORAL_TABLET | Freq: Three times a day (TID) | ORAL | 0 refills | Status: DC | PRN

## 2022-12-14 MED ORDER — AZITHROMYCIN 500 MG PO TABS
1000.0000 mg | ORAL_TABLET | Freq: Once | ORAL | Status: AC
  Administered 2022-12-14: 1000 mg via ORAL
  Filled 2022-12-14: qty 2

## 2022-12-14 MED ORDER — ONDANSETRON HCL 4 MG/2ML IJ SOLN
4.0000 mg | Freq: Once | INTRAMUSCULAR | Status: AC
  Administered 2022-12-14: 4 mg via INTRAVENOUS
  Filled 2022-12-14: qty 2

## 2022-12-14 MED ORDER — IBUPROFEN 600 MG PO TABS
600.0000 mg | ORAL_TABLET | Freq: Three times a day (TID) | ORAL | 0 refills | Status: DC | PRN
  Filled 2022-12-14: qty 20, 7d supply, fill #0

## 2022-12-14 MED ORDER — SODIUM CHLORIDE 0.9 % IV BOLUS
1000.0000 mL | Freq: Once | INTRAVENOUS | Status: AC
  Administered 2022-12-14: 1000 mL via INTRAVENOUS

## 2022-12-14 MED ORDER — CEFDINIR 300 MG PO CAPS: 300.0000 mg | ORAL_CAPSULE | Freq: Two times a day (BID) | ORAL | 0 refills | Status: AC

## 2022-12-14 MED ORDER — OXYCODONE-ACETAMINOPHEN 5-325 MG PO TABS
1.0000 | ORAL_TABLET | Freq: Once | ORAL | Status: AC
  Administered 2022-12-14: 1 via ORAL
  Filled 2022-12-14: qty 1

## 2022-12-14 MED ORDER — FLUCONAZOLE 150 MG PO TABS: 150.0000 mg | ORAL_TABLET | Freq: Once | ORAL | 0 refills | Status: AC

## 2022-12-14 MED ORDER — CEFDINIR 300 MG PO CAPS
300.0000 mg | ORAL_CAPSULE | Freq: Two times a day (BID) | ORAL | 0 refills | Status: DC
  Filled 2022-12-14: qty 14, 7d supply, fill #0

## 2022-12-14 MED ORDER — KETOROLAC TROMETHAMINE 30 MG/ML IJ SOLN
15.0000 mg | Freq: Once | INTRAMUSCULAR | Status: AC
  Administered 2022-12-14: 15 mg via INTRAVENOUS
  Filled 2022-12-14: qty 1

## 2022-12-14 MED ORDER — HYDROCODONE-ACETAMINOPHEN 5-325 MG PO TABS: 1.0000 | ORAL_TABLET | Freq: Four times a day (QID) | ORAL | 0 refills | Status: DC | PRN

## 2022-12-14 MED ORDER — SODIUM CHLORIDE 0.9 % IV SOLN
2.0000 g | Freq: Once | INTRAVENOUS | Status: AC
  Administered 2022-12-14: 2 g via INTRAVENOUS
  Filled 2022-12-14: qty 20

## 2022-12-14 NOTE — ED Triage Notes (Signed)
Pt reports generalized abd pain, back pain and recent unprotected sex. Possible STD exposure.

## 2022-12-14 NOTE — ED Provider Notes (Signed)
[facility] Provider Note    Event Date/Time   First MD Initiated Contact with Patient 12/14/22 1415     (approximate)   History   Abdominal Pain   HPI  Briana Lyons is a 42 y.o. female  here with abdominal pain. Pt reports that over the past week, she has had urinary urgency, frequency, and lower abd pain. She has had some associated nausea and general fatigue. She has a h/o UTIs. She has had some foul smelling urine as well. She states that she also was exposed to someone who had an STI so she wants to be tested. She is currenty on her period. Denies any dyspareunia or vaginal discharge. No h/o PID.       Physical Exam   Triage Vital Signs: ED Triage Vitals  Enc Vitals Group     BP 12/14/22 1347 130/87     Pulse Rate 12/14/22 1347 (!) 102     Resp 12/14/22 1347 18     Temp 12/14/22 1347 98.3 F (36.8 C)     Temp Source 12/14/22 1347 Oral     SpO2 12/14/22 1347 98 %     Weight 12/14/22 1348 180 lb (81.6 kg)     Height 12/14/22 1348 5' 3"$  (1.6 m)     Head Circumference --      Peak Flow --      Pain Score 12/14/22 1350 7     Pain Loc --      Pain Edu? --      Excl. in [place]? --     Most recent vital signs: Vitals:   12/14/22 1347  BP: 130/87  Pulse: (!) 102  Resp: 18  Temp: 98.3 F (36.8 C)  SpO2: 98%     General: Awake, no distress.  CV:  Good peripheral perfusion.  Resp:  Normal work of breathing.  Abd:  No distention. Mild ttp in lower abdomen, no CVAT, no rebound or guarding. Other:  MMM. Well appearing.   ED Results / Procedures / Treatments   Labs (all labs ordered are listed, but only abnormal results are displayed) Labs Reviewed  CHLAMYDIA/NGC RT PCR ([facility] ONLY)           - Abnormal; Notable for the following components:      Result Value   N gonorrhoeae DETECTED (*)    All other components within normal limits  COMPREHENSIVE METABOLIC PANEL - Abnormal; Notable for the following components:   Glucose, Bld 124 (*)     Calcium 8.8 (*)    All other components within normal limits  URINALYSIS, ROUTINE W REFLEX MICROSCOPIC - Abnormal; Notable for the following components:   Color, Urine RED (*)    APPearance CLOUDY (*)    Glucose, UA   (*)    Value: TEST NOT REPORTED DUE TO COLOR INTERFERENCE OF URINE PIGMENT   Hgb urine dipstick   (*)    Value: TEST NOT REPORTED DUE TO COLOR INTERFERENCE OF URINE PIGMENT   Bilirubin Urine   (*)    Value: TEST NOT REPORTED DUE TO COLOR INTERFERENCE OF URINE PIGMENT   Ketones, ur   (*)    Value: TEST NOT REPORTED DUE TO COLOR INTERFERENCE OF URINE PIGMENT   Protein, ur   (*)    Value: TEST NOT REPORTED DUE TO COLOR INTERFERENCE OF URINE PIGMENT   Nitrite   (*)    Value: TEST NOT REPORTED DUE TO COLOR INTERFERENCE OF URINE PIGMENT  Leukocytes,Ua   (*)    Value: TEST NOT REPORTED DUE TO COLOR INTERFERENCE OF URINE PIGMENT   All other components within normal limits  WET PREP, GENITAL  URINE CULTURE  LIPASE, BLOOD  CBC  HIV ANTIBODY (ROUTINE TESTING W REFLEX)     EKG    RADIOLOGY    I also independently reviewed and agree with radiologist interpretations.   PROCEDURES:  Critical Care performed: No   MEDICATIONS ORDERED IN ED: Medications  cefTRIAXone (ROCEPHIN) 2 g in sodium chloride 0.9 % 100 mL IVPB (0 g Intravenous Stopped 12/14/22 1500)  sodium chloride 0.9 % bolus 1,000 mL (0 mLs Intravenous Stopped 12/14/22 1520)  ketorolac (TORADOL) 30 MG/ML injection 15 mg (15 mg Intravenous Given 12/14/22 1500)  ondansetron (ZOFRAN) injection 4 mg (4 mg Intravenous Given 12/14/22 1500)  oxyCODONE-acetaminophen (PERCOCET/ROXICET) 5-325 MG per tablet 1 tablet (1 tablet Oral Given 12/14/22 1502)  azithromycin (ZITHROMAX) tablet 1,000 mg (1,000 mg Oral Given 12/14/22 1535)     IMPRESSION / MDM / ASSESSMENT AND PLAN / ED COURSE  I reviewed the triage vital signs and the nursing notes.                              Differential diagnosis includes, but is not  limited to, UTI, pyelo, STI/STD, PID, colitis, diverticulitis.  Patient's presentation is most consistent with acute presentation with potential threat to life or bodily function.  42 yo F here with urinary urgency, frequency, pain. Suspect UTI clinically and UA is consistent with this. She has no fever, flank pain, vomiting, or signs of sepsis. Pt also has possible STi exposure though exam is less consistent with PID and urinary sx are more consistent with UTI. No signs to suggest TOA or Fitz-Hugh-Curtis. Abdomen is soft, NT, with no focal findings or peritonitis. Will give Rocephin IV, azithro 1g and plan to d/c with outpt course for UTI with cefdinir. If GC/C + chlamydia will f/u with addition of doxy though she did receive azithro 1 g here.     FINAL CLINICAL IMPRESSION(S) / ED DIAGNOSES   Final diagnoses:  Lower abdominal pain  Acute cystitis without hematuria     Rx / DC Orders       Note:  This document was prepared using Dragon voice recognition software and may include unintentional dictation errors.   Duffy Bruce, MD 12/14/22 2004

## 2022-12-14 NOTE — ED Notes (Signed)
Provided cola, pillow, remote per request.

## 2022-12-14 NOTE — Telephone Encounter (Signed)
Gonorrhea + on my review of notes. Will call in Doxy though this should theoretically be adequately treated with current regimen. I've asked RN to contact with results and pt was aware this was a possibility.

## 2022-12-15 ENCOUNTER — Other Ambulatory Visit: Payer: Self-pay

## 2022-12-15 LAB — URINE CULTURE

## 2022-12-31 ENCOUNTER — Emergency Department
Admission: EM | Admit: 2022-12-31 | Discharge: 2022-12-31 | Disposition: A | Discharge status: Home / Self Care | Payer: Medicaid Other | Attending: Emergency Medicine | Admitting: Emergency Medicine

## 2022-12-31 ENCOUNTER — Other Ambulatory Visit: Payer: Self-pay

## 2022-12-31 DIAGNOSIS — K0889 Other specified disorders of teeth and supporting structures: Secondary | ICD-10-CM | POA: Diagnosis not present

## 2022-12-31 DIAGNOSIS — B9689 Other specified bacterial agents as the cause of diseases classified elsewhere: Secondary | ICD-10-CM | POA: Insufficient documentation

## 2022-12-31 DIAGNOSIS — R102 Pelvic and perineal pain: Secondary | ICD-10-CM | POA: Diagnosis present

## 2022-12-31 DIAGNOSIS — N760 Acute vaginitis: Secondary | ICD-10-CM | POA: Diagnosis not present

## 2022-12-31 DIAGNOSIS — B3731 Acute candidiasis of vulva and vagina: Secondary | ICD-10-CM

## 2022-12-31 LAB — URINALYSIS, W/ REFLEX TO CULTURE (INFECTION SUSPECTED)
Bacteria, UA: NONE SEEN
Bilirubin Urine: NEGATIVE
Glucose, UA: NEGATIVE mg/dL
Ketones, ur: NEGATIVE mg/dL
Nitrite: NEGATIVE
Protein, ur: NEGATIVE mg/dL
Specific Gravity, Urine: 1.018 (ref 1.005–1.030)
pH: 5 (ref 5.0–8.0)

## 2022-12-31 LAB — WET PREP, GENITAL
Sperm: NONE SEEN
Trich, Wet Prep: NONE SEEN
WBC, Wet Prep HPF POC: >=10 — AB (ref <10)

## 2022-12-31 LAB — CHLAMYDIA/NGC RT PCR (ARMC ONLY)
Chlamydia Tr: NOT DETECTED
N gonorrhoeae: NOT DETECTED

## 2022-12-31 LAB — POC URINE PREG, ED: Preg Test, Ur: NEGATIVE

## 2022-12-31 MED ORDER — METRONIDAZOLE 500 MG PO TABS: 500.0000 mg | ORAL_TABLET | Freq: Two times a day (BID) | ORAL | 0 refills | Status: DC

## 2022-12-31 MED ORDER — FLUCONAZOLE 150 MG PO TABS: 150.0000 mg | ORAL_TABLET | Freq: Every day | ORAL | 1 refills | Status: DC

## 2022-12-31 NOTE — Discharge Instructions (Addendum)
A prescription for your metronidazole to take for the next 7 days and fluconazole for the yeast infection was sent to the pharmacy.  The antibiotic is not to be taken with anything containing alcohol as it could cause a reaction.  You can see the results of your gonorrhea and Chlamydia test on MyChart.  Also the health department is available anytime for STD testing.

## 2022-12-31 NOTE — ED Triage Notes (Signed)
C/O left lower jar dental pain and c/o pelvic pain.  Patient thinks she has BV again.  States seen through ED on 12/14/2022, had some cultures done, would like the results.  AAOx3.  Skin warm and dry. NAD

## 2022-12-31 NOTE — ED Provider Notes (Signed)
[facility] Provider Note    Event Date/Time   First MD Initiated Contact with Patient 12/31/22 [phone]     (approximate)   History   Pelvic Pain   HPI  Briana Lyons is a 42 y.o. female presents to the ED with complaint of dental pain and also pelvic pain.  Patient states that she was here last month and did not receive the results of her test for her pelvic pain at that time.  She is unable to see it on MyChart.  She believes she has similar symptoms today.     Physical Exam   Triage Vital Signs: ED Triage Vitals [12/31/22 0829]  Enc Vitals Group     BP (!) 141/85     Pulse Rate 84     Resp 16     Temp 97.9 F (36.6 C)     Temp Source Oral     SpO2 99 %     Weight 179 lb 14.3 oz (81.6 kg)     Height '5\' 3"'$  (1.6 m)     Head Circumference      Peak Flow      Pain Score 8     Pain Loc      Pain Edu?      Excl. in [place]?     Most recent vital signs: Vitals:   12/31/22 0829  BP: (!) 141/85  Pulse: 84  Resp: 16  Temp: 97.9 F (36.6 C)  SpO2: 99%     General: Awake, no distress.  CV:  Good peripheral perfusion.  Resp:  Normal effort.  Abd:  No distention.  Soft, nontender, bowel sounds normoactive x 4 quadrants. Other:  Left lower central incisor poorly maintained and extremely poor repair with only a shard of tooth above the gumline.  Gum is edematous but no active drainage noted.  This appears to be chronic.  Patient is otherwise edentulous.   ED Results / Procedures / Treatments   Labs (all labs ordered are listed, but only abnormal results are displayed) Labs Reviewed  WET PREP, GENITAL - Abnormal; Notable for the following components:      Result Value   Yeast Wet Prep HPF POC PRESENT (*)    Clue Cells Wet Prep HPF POC PRESENT (*)    WBC, Wet Prep HPF POC >=10 (*)    All other components within normal limits  URINALYSIS, W/ REFLEX TO CULTURE (INFECTION SUSPECTED) - Abnormal; Notable for the following components:   Color, Urine  YELLOW (*)    APPearance CLOUDY (*)    Hgb urine dipstick MODERATE (*)    Leukocytes,Ua SMALL (*)    All other components within normal limits  CHLAMYDIA/NGC RT PCR ([facility] ONLY)            POC URINE PREG, ED  POC URINE PREG, ED      PROCEDURES:  Critical Care performed:   Procedures   MEDICATIONS ORDERED IN ED: Medications - No data to display   IMPRESSION / MDM / [place] / ED COURSE  I reviewed the triage vital signs and the nursing notes.   Differential diagnosis includes, but is not limited to, dental pain, dental abscess, gingivitis, pelvic discomfort, bacterial vaginosis, yeast vaginosis, gonorrhea, chlamydia.  42 year old female presents to the ED with complaint of vaginal discomfort.  She was made aware that on her last visit her test results were positive for gonorrhea and that she was treated with Rocephin 2 g IV.  She states that she has not had sexual intercourse since that visit.  She was unable to see the test results on MyChart for this.  Patient believes that the symptoms that she is having currently are more like BV.  Wet prep does show yeast and clue cells, chlamydia and gonorrhea were negative, pregnancy test negative, urinalysis showed 6-10 RBCs and 0-5 WBCs with no bacteria.  Patient was made aware of the following test results and she is aware that a prescription for Diflucan and metronidazole was being sent to the pharmacy for her to take.  We also discussed that she could go to the [place] for STD test to be done as she has had multiple ER visits for the same.      Patient's presentation is most consistent with acute complicated illness / injury requiring diagnostic workup.  FINAL CLINICAL IMPRESSION(S) / ED DIAGNOSES   Final diagnoses:  BV (bacterial vaginosis)  Yeast vaginitis     Rx / DC Orders   ED Discharge Orders          Ordered    fluconazole (DIFLUCAN) 150 MG tablet  Daily        12/31/22 0952     metroNIDAZOLE (FLAGYL) 500 MG tablet  2 times daily        12/31/22 [id number]             Note:  This document was prepared using Dragon voice recognition software and may include unintentional dictation errors.   Johnn Hai, PA-C 12/31/22 1324    Lavonia Drafts, MD 01/01/23 [phone]

## 2023-01-05 ENCOUNTER — Other Ambulatory Visit: Payer: Self-pay

## 2023-02-19 ENCOUNTER — Other Ambulatory Visit: Payer: Self-pay

## 2023-02-19 ENCOUNTER — Emergency Department
Admission: EM | Admit: 2023-02-19 | Discharge: 2023-02-19 | Disposition: A | Discharge status: Home / Self Care | Payer: Medicaid Other | Attending: Student in an Organized Health Care Education/Training Program | Admitting: Student in an Organized Health Care Education/Training Program

## 2023-02-19 ENCOUNTER — Emergency Department: Payer: Medicaid Other

## 2023-02-19 DIAGNOSIS — K8020 Calculus of gallbladder without cholecystitis without obstruction: Secondary | ICD-10-CM | POA: Diagnosis not present

## 2023-02-19 DIAGNOSIS — R109 Unspecified abdominal pain: Secondary | ICD-10-CM | POA: Diagnosis not present

## 2023-02-19 DIAGNOSIS — R300 Dysuria: Secondary | ICD-10-CM | POA: Diagnosis not present

## 2023-02-19 DIAGNOSIS — R1032 Left lower quadrant pain: Secondary | ICD-10-CM | POA: Insufficient documentation

## 2023-02-19 LAB — CBC WITH DIFFERENTIAL/PLATELET
Abs Immature Granulocytes: 0.03 K/uL (ref 0.00–0.07)
Basophils Absolute: 0.1 K/uL (ref 0.0–0.1)
Basophils Relative: 1 %
Eosinophils Absolute: 0.2 K/uL (ref 0.0–0.5)
Eosinophils Relative: 2 %
HCT: 40 % (ref 36.0–46.0)
Hemoglobin: 12.9 g/dL (ref 12.0–15.0)
Immature Granulocytes: 0 %
Lymphocytes Relative: 25 %
Lymphs Abs: 2.1 K/uL (ref 0.7–4.0)
MCH: 28.9 pg (ref 26.0–34.0)
MCHC: 32.3 g/dL (ref 30.0–36.0)
MCV: 89.7 fL (ref 80.0–100.0)
Monocytes Absolute: 0.5 K/uL (ref 0.1–1.0)
Monocytes Relative: 6 %
Neutro Abs: 5.6 K/uL (ref 1.7–7.7)
Neutrophils Relative %: 66 %
Platelets: 375 K/uL (ref 150–400)
RBC: 4.46 MIL/uL (ref 3.87–5.11)
RDW: 13.7 % (ref 11.5–15.5)
WBC: 8.5 K/uL (ref 4.0–10.5)
nRBC: 0 % (ref 0.0–0.2)

## 2023-02-19 LAB — URINALYSIS, ROUTINE W REFLEX MICROSCOPIC
Bilirubin Urine: NEGATIVE
Glucose, UA: NEGATIVE mg/dL
Ketones, ur: NEGATIVE mg/dL
Nitrite: NEGATIVE
Protein, ur: NEGATIVE mg/dL
Specific Gravity, Urine: 1.009 (ref 1.005–1.030)
pH: 5 (ref 5.0–8.0)

## 2023-02-19 LAB — COMPREHENSIVE METABOLIC PANEL
ALT: 16 U/L (ref 0–44)
AST: 17 U/L (ref 15–41)
Albumin: 4.2 g/dL (ref 3.5–5.0)
Alkaline Phosphatase: 73 U/L (ref 38–126)
Anion gap: 7 (ref 5–15)
BUN: 10 mg/dL (ref 6–20)
CO2: 24 mmol/L (ref 22–32)
Calcium: 9 mg/dL (ref 8.9–10.3)
Chloride: 106 mmol/L (ref 98–111)
Creatinine, Ser: 0.71 mg/dL (ref 0.44–1.00)
GFR, Estimated: >60 mL/min (ref >60)
Glucose, Bld: 128 mg/dL — ABNORMAL HIGH (ref 70–99)
Potassium: 3.6 mmol/L (ref 3.5–5.1)
Sodium: 137 mmol/L (ref 135–145)
Total Bilirubin: 0.2 mg/dL — ABNORMAL LOW (ref 0.3–1.2)
Total Protein: 7.5 g/dL (ref 6.5–8.1)

## 2023-02-19 LAB — LIPASE, BLOOD: Lipase: 260 U/L — ABNORMAL HIGH (ref 11–51)

## 2023-02-19 LAB — PREGNANCY, URINE: Preg Test, Ur: NEGATIVE

## 2023-02-19 MED ORDER — MORPHINE SULFATE (PF) 4 MG/ML IV SOLN
4.0000 mg | Freq: Once | INTRAVENOUS | Status: AC
  Administered 2023-02-19: 2 mg via INTRAVENOUS
  Filled 2023-02-19: qty 1

## 2023-02-19 MED ORDER — TRAMADOL HCL 50 MG PO TABS: 50.0000 mg | ORAL_TABLET | Freq: Four times a day (QID) | ORAL | 0 refills | Status: DC | PRN

## 2023-02-19 MED ORDER — NITROFURANTOIN MONOHYD MACRO 100 MG PO CAPS: 100.0000 mg | ORAL_CAPSULE | Freq: Two times a day (BID) | ORAL | 0 refills | Status: AC

## 2023-02-19 MED ORDER — IOHEXOL 300 MG/ML  SOLN
100.0000 mL | Freq: Once | INTRAMUSCULAR | Status: AC | PRN
  Administered 2023-02-19: 100 mL via INTRAVENOUS

## 2023-02-19 MED ORDER — SODIUM CHLORIDE 0.9 % IV BOLUS
500.0000 mL | Freq: Once | INTRAVENOUS | Status: AC
  Administered 2023-02-19: 500 mL via INTRAVENOUS

## 2023-02-19 NOTE — ED Provider Notes (Signed)
[facility] Provider Note    Event Date/Time   First MD Initiated Contact with Patient 02/19/23 [phone]     (approximate)   History   Abdominal Pain   HPI  Briana Lyons is a 42 y.o. female presents to the ER for evaluation of several days increased urinary frequency and some left flank discomfort.  Having some the discomfort rating to the left lower quadrant.  Denies any discharge.  No diarrhea no nausea or vomiting.  No fevers.       Physical Exam   Triage Vital Signs: ED Triage Vitals  Enc Vitals Group     BP 02/19/23 0916 (!) 142/93     Pulse Rate 02/19/23 0916 97     Resp 02/19/23 0916 20     Temp 02/19/23 0916 98.3 F (36.8 C)     Temp Source 02/19/23 0916 Oral     SpO2 02/19/23 0916 100 %     Weight 02/19/23 0915 180 lb 12.4 oz (82 kg)     Height 02/19/23 0915 5\' 3"  (1.6 m)     Head Circumference --      Peak Flow --      Pain Score 02/19/23 0915 5     Pain Loc --      Pain Edu? --      Excl. in [place]? --     Most recent vital signs: Vitals:   02/19/23 0916 02/19/23 1000  BP: (!) 142/93 113/77  Pulse: 97 83  Resp: 20 18  Temp: 98.3 F (36.8 C)   SpO2: 100% 100%     Constitutional: Alert  Eyes: Conjunctivae are normal.  Head: Atraumatic. Nose: No congestion/rhinnorhea. Mouth/Throat: Mucous membranes are moist.   Neck: Painless ROM.  Cardiovascular:   Good peripheral circulation. Respiratory: Normal respiratory effort.  No retractions.  Gastrointestinal: Soft and nontender in all four quadrants  Musculoskeletal:  no deformity Neurologic:  MAE spontaneously. No gross focal neurologic deficits are appreciated.  Skin:  Skin is warm, dry and intact. No rash noted. Psychiatric: Mood and affect are normal. Speech and behavior are normal.    ED Results / Procedures / Treatments   Labs (all labs ordered are listed, but only abnormal results are displayed) Labs Reviewed  COMPREHENSIVE METABOLIC PANEL - Abnormal; Notable for the  following components:      Result Value   Glucose, Bld 128 (*)    Total Bilirubin 0.2 (*)    All other components within normal limits  LIPASE, BLOOD - Abnormal; Notable for the following components:   Lipase 260 (*)    All other components within normal limits  URINALYSIS, ROUTINE W REFLEX MICROSCOPIC - Abnormal; Notable for the following components:   Color, Urine YELLOW (*)    APPearance HAZY (*)    Hgb urine dipstick SMALL (*)    Leukocytes,Ua SMALL (*)    Bacteria, UA RARE (*)    All other components within normal limits  CBC WITH DIFFERENTIAL/PLATELET  PREGNANCY, URINE     EKG     RADIOLOGY Patient presenting to the ER for evaluation of symptoms as described above.  Based on symptoms, risk factors and considered above differential, this presenting complaint could reflect a potentially life-threatening illness therefore the patient will be placed on continuous pulse oximetry and telemetry for monitoring.  Laboratory evaluation will be sent to evaluate for the above complaints.      PROCEDURES:  Critical Care performed: No  Procedures   MEDICATIONS ORDERED IN  ED: Medications  sodium chloride 0.9 % bolus 500 mL (0 mLs Intravenous Stopped 02/19/23 1148)  iohexol (OMNIPAQUE) 300 MG/ML solution 100 mL (100 mLs Intravenous Contrast Given 02/19/23 1055)  morphine (PF) 4 MG/ML injection 4 mg (2 mg Intravenous Given 02/19/23 1119)     IMPRESSION / MDM / ASSESSMENT AND PLAN / ED COURSE  I reviewed the triage vital signs and the nursing notes.                              Differential diagnosis includes, but is not limited to, cystitis, pyelonephritis, stone, ovarian cyst, diverticulitis, colitis, pancreatitis  Patient presenting to the ER for evaluation of symptoms as described above.  Based on symptoms, risk factors and considered above differential, this presenting complaint could reflect a potentially life-threatening illness therefore the patient will be placed on  continuous pulse oximetry and telemetry for monitoring.  Laboratory evaluation will be sent to evaluate for the above complaints.      Clinical Course as of 02/19/23 1148  Sun Feb 19, 2023  1022 Patient's lipase is elevated.  Suspect presentation may be secondary to pancreatitis.  Will order CT imaging to further evaluate.  Patient is declining anything for pain at this time she wants to avoid narcotic medication. [PR]  1142 Patient reassessed.  She does not have any right upper quadrant tenderness palpation.  No findings to suggest pancreatitis.  Normal common bile duct.  Not consistent with gallstone pancreatitis or biliary pathology at this time.  She is complaining of dysuria will cover with Macrobid.  Discussed tricked return precautions but at this point I do believe she stable and appropriate for outpatient follow-up. [PR]    Clinical Course User Index [PR] Willy Eddy, MD     FINAL CLINICAL IMPRESSION(S) / ED DIAGNOSES   Final diagnoses:  Left lower quadrant pain  Dysuria     Rx / DC Orders   ED Discharge Orders          Ordered    nitrofurantoin, macrocrystal-monohydrate, (MACROBID) 100 MG capsule  2 times daily        02/19/23 1141    traMADol (ULTRAM) 50 MG tablet  Every 6 hours PRN        02/19/23 1141             Note:  This document was prepared using Dragon voice recognition software and may include unintentional dictation errors.    Willy Eddy, MD 02/19/23 [phone]

## 2023-02-19 NOTE — ED Triage Notes (Signed)
Pt reports generalized abd pain for over a week. Reports some nausea. Denies VD, fevers or other issues

## 2023-02-19 NOTE — ED Notes (Signed)
Pt discharge to home. Pt VSS, GCS 15, NAD. Pt verbalized understanding of discharge instructions with no additional questions at this time.  

## 2023-02-19 NOTE — ED Notes (Signed)
RN administering morphine. Pt states "dont push anymore, I dont take pain meds and this is making me dizzy." RN stopped administering medication. 2mg  morphine given.

## 2023-03-10 ENCOUNTER — Encounter: Payer: Self-pay | Admitting: Nurse Practitioner

## 2023-03-10 ENCOUNTER — Ambulatory Visit (INDEPENDENT_AMBULATORY_CARE_PROVIDER_SITE_OTHER): Payer: Medicaid Other | Admitting: Nurse Practitioner

## 2023-03-10 VITALS — BP 159/89 | HR 111 | Temp 98.1°F | Ht 63.0 in | Wt 202.2 lb

## 2023-03-10 DIAGNOSIS — K219 Gastro-esophageal reflux disease without esophagitis: Secondary | ICD-10-CM

## 2023-03-10 DIAGNOSIS — F314 Bipolar disorder, current episode depressed, severe, without psychotic features: Secondary | ICD-10-CM | POA: Diagnosis not present

## 2023-03-10 DIAGNOSIS — N200 Calculus of kidney: Secondary | ICD-10-CM

## 2023-03-10 DIAGNOSIS — Z7689 Persons encountering health services in other specified circumstances: Secondary | ICD-10-CM

## 2023-03-10 DIAGNOSIS — R300 Dysuria: Secondary | ICD-10-CM | POA: Diagnosis not present

## 2023-03-10 LAB — WET PREP FOR TRICH, YEAST, CLUE
Clue Cell Exam: POSITIVE — AB
Trichomonas Exam: NEGATIVE
Yeast Exam: NEGATIVE

## 2023-03-10 LAB — URINALYSIS, ROUTINE W REFLEX MICROSCOPIC
Bilirubin, UA: NEGATIVE
Glucose, UA: NEGATIVE
Ketones, UA: NEGATIVE
Leukocytes,UA: NEGATIVE
Nitrite, UA: NEGATIVE
Specific Gravity, UA: 1.025 (ref 1.005–1.030)
Urobilinogen, Ur: 0.2 mg/dL (ref 0.2–1.0)
pH, UA: 6.5 (ref 5.0–7.5)

## 2023-03-10 LAB — MICROSCOPIC EXAMINATION: Bacteria, UA: NONE SEEN

## 2023-03-10 MED ORDER — OMEPRAZOLE 20 MG PO CPDR: 20.0000 mg | DELAYED_RELEASE_CAPSULE | Freq: Every day | ORAL | 1 refills | Status: DC

## 2023-03-10 MED ORDER — METRONIDAZOLE 500 MG PO TABS: 500.0000 mg | ORAL_TABLET | Freq: Two times a day (BID) | ORAL | 0 refills | Status: AC

## 2023-03-10 MED ORDER — FLUTICASONE PROPIONATE 50 MCG/ACT NA SUSP: 2.0000 | Freq: Every day | NASAL | 3 refills | Status: DC
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
## 2023-03-10 NOTE — Progress Notes (Signed)
Please let patient know that she does have BV.  I have sent in a prescription for flagyl to treat this.

## 2023-03-10 NOTE — Assessment & Plan Note (Signed)
New referral placed for patient to see psychiatry. 

## 2023-03-10 NOTE — Addendum Note (Signed)
Addended by: Larae Grooms on: 03/10/2023 11:54 AM   Modules accepted: Orders

## 2023-03-10 NOTE — Assessment & Plan Note (Signed)
Will start Omeprazole 20mg  PRN for acid reflux.  Side effects and benefits of medication discussed.  Follow up in 1 month.  Call sooner if concerns arise.

## 2023-03-15 ENCOUNTER — Other Ambulatory Visit: Payer: Self-pay

## 2023-03-21 ENCOUNTER — Telehealth: Payer: Self-pay | Admitting: *Deleted

## 2023-03-21 NOTE — Telephone Encounter (Signed)
Patient called to review lab results. Last labs from 03/10/23 already reviewed on 03/10/23. Patient started to report she is in severe pain left side. Reports she has gallstones and in terrible pain. Nausea reported. Patient reports she is going to ED for evaluation. Wanted appt with PCP but none available until 03/23/23.

## 2023-03-25 ENCOUNTER — Other Ambulatory Visit: Payer: Self-pay

## 2023-03-25 ENCOUNTER — Emergency Department
Admission: EM | Admit: 2023-03-25 | Discharge: 2023-03-25 | Discharge status: Against Medical Advice | Payer: Medicaid Other | Attending: Emergency Medicine | Admitting: Emergency Medicine

## 2023-03-25 DIAGNOSIS — R109 Unspecified abdominal pain: Secondary | ICD-10-CM | POA: Insufficient documentation

## 2023-03-25 DIAGNOSIS — Z5321 Procedure and treatment not carried out due to patient leaving prior to being seen by health care provider: Secondary | ICD-10-CM | POA: Diagnosis not present

## 2023-03-25 LAB — URINALYSIS, ROUTINE W REFLEX MICROSCOPIC
Bacteria, UA: NONE SEEN
Bilirubin Urine: NEGATIVE
Glucose, UA: NEGATIVE mg/dL
Ketones, ur: NEGATIVE mg/dL
Leukocytes,Ua: NEGATIVE
Nitrite: NEGATIVE
Protein, ur: NEGATIVE mg/dL
Specific Gravity, Urine: 1.002 — ABNORMAL LOW (ref 1.005–1.030)
pH: 6 (ref 5.0–8.0)

## 2023-03-25 NOTE — ED Notes (Signed)
Pt

## 2023-03-25 NOTE — ED Triage Notes (Signed)
Pt presents via POV c/o left sided flank pain. Reports called by radiologist with gallstones but pt is reporting pain of left flank.

## 2023-04-24 ENCOUNTER — Other Ambulatory Visit (HOSPITAL_COMMUNITY)
Admission: RE | Admit: 2023-04-24 | Discharge: 2023-04-24 | Disposition: A | Discharge status: Home / Self Care | Payer: Medicaid Other | Source: Ambulatory Visit | Attending: Nurse Practitioner | Admitting: Nurse Practitioner

## 2023-04-24 ENCOUNTER — Ambulatory Visit: Payer: Self-pay

## 2023-04-24 ENCOUNTER — Ambulatory Visit (INDEPENDENT_AMBULATORY_CARE_PROVIDER_SITE_OTHER): Payer: Medicaid Other | Admitting: Physician Assistant

## 2023-04-24 ENCOUNTER — Telehealth: Payer: Self-pay | Admitting: Nurse Practitioner

## 2023-04-24 ENCOUNTER — Encounter: Payer: Self-pay | Admitting: Physician Assistant

## 2023-04-24 VITALS — BP 158/101 | HR 94 | Temp 98.6°F | Ht 63.0 in | Wt 197.2 lb

## 2023-04-24 DIAGNOSIS — Z1231 Encounter for screening mammogram for malignant neoplasm of breast: Secondary | ICD-10-CM

## 2023-04-24 DIAGNOSIS — Z114 Encounter for screening for human immunodeficiency virus [HIV]: Secondary | ICD-10-CM

## 2023-04-24 DIAGNOSIS — T7491XS Unspecified adult maltreatment, confirmed, sequela: Secondary | ICD-10-CM

## 2023-04-24 DIAGNOSIS — B3731 Acute candidiasis of vulva and vagina: Secondary | ICD-10-CM | POA: Diagnosis not present

## 2023-04-24 DIAGNOSIS — Z0000 Encounter for general adult medical examination without abnormal findings: Secondary | ICD-10-CM

## 2023-04-24 DIAGNOSIS — Z136 Encounter for screening for cardiovascular disorders: Secondary | ICD-10-CM

## 2023-04-24 DIAGNOSIS — Z1159 Encounter for screening for other viral diseases: Secondary | ICD-10-CM

## 2023-04-24 DIAGNOSIS — F322 Major depressive disorder, single episode, severe without psychotic features: Secondary | ICD-10-CM

## 2023-04-24 LAB — URINALYSIS, ROUTINE W REFLEX MICROSCOPIC
Bilirubin, UA: NEGATIVE
Glucose, UA: NEGATIVE
Ketones, UA: NEGATIVE
Leukocytes,UA: NEGATIVE
Nitrite, UA: NEGATIVE
Specific Gravity, UA: 1.02 (ref 1.005–1.030)
Urobilinogen, Ur: 0.2 mg/dL (ref 0.2–1.0)
pH, UA: 7 (ref 5.0–7.5)

## 2023-04-24 LAB — MICROSCOPIC EXAMINATION
Bacteria, UA: NONE SEEN
WBC, UA: NONE SEEN /hpf (ref 0–5)

## 2023-04-24 NOTE — Telephone Encounter (Signed)
Was back pain discussed at today's visit?

## 2023-04-24 NOTE — Telephone Encounter (Signed)
Referral Request - Has patient seen PCP for this complaint? yes *If NO, is insurance requiring patient see PCP for this issue before PCP can refer them? Referral for which specialty: ? Preferred provider/office: ? Reason for referral: kidney stones

## 2023-04-24 NOTE — Telephone Encounter (Signed)
Sorry paid she had was today, at her appt  ----- Message from [place] sent at 04/24/2023 11:54 AM EDT ----- Pt called in about kidney stones,, has been having pain in sides, and back for a year now, also today, and at appt she had yesterday. She says she doesn't taking strong med, says tramadol is ok. She says she also needs a referral to have surgery for the kidney stones, she was told.   Chief Complaint: States she was by Radiology that she has gallstones and needs to see a Careers adviser. Also asking for Tramadol for pain. Had OV today Symptoms: Above.Pain to low back on left side. Frequency: 1 year ago started having pain. Pertinent Negatives: Patient denies  Disposition: [] ED /[] Urgent Care (no appt availability in office) / [] Appointment(In office/virtual)/ []  [facility]/ [] Home Care/ [x] Refused Recommended Disposition /[] [facility]/ []  Follow-up with PCP Additional Notes: Please advise pt.  Reason for Disposition  [1] MODERATE back pain (e.g., interferes with normal activities) AND [2] present > 3 days  Answer Assessment - Initial Assessment Questions 1. ONSET: "When did the pain begin?"      1 year ago 2. LOCATION: "Where does it hurt?" (upper, mid or lower back)     Low and left and side 3. SEVERITY: "How bad is the pain?"  (e.g., Scale 1-10; mild, moderate, or severe)   - MILD (1-3): Doesn't interfere with normal activities.    - MODERATE (4-7): Interferes with normal activities or awakens from sleep.    - SEVERE (8-10): Excruciating pain, unable to do any normal activities.      Dull pain 4. PATTERN: "Is the pain constant?" (e.g., yes, no; constant, intermittent)      Comes and goes 5. RADIATION: "Does the pain shoot into your legs or somewhere else?"     Yes 6. CAUSE:  "What do you think is causing the back pain?"      Gallbladder 7. BACK OVERUSE:  "Any recent lifting of heavy objects, strenuous work or exercise?"     No 8. MEDICINES: "What have  you taken so far for the pain?" (e.g., nothing, acetaminophen, NSAIDS)     None 9. NEUROLOGIC SYMPTOMS: "Do you have any weakness, numbness, or problems with bowel/bladder control?"     No 10. OTHER SYMPTOMS: "Do you have any other symptoms?" (e.g., fever, abdomen pain, burning with urination, blood in urine)       No 11. PREGNANCY: "Is there any chance you are pregnant?" "When was your last menstrual period?"       No  Protocols used: Back Pain-A-AH
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
## 2023-04-25 DIAGNOSIS — F322 Major depressive disorder, single episode, severe without psychotic features: Secondary | ICD-10-CM | POA: Insufficient documentation

## 2023-04-25 LAB — LIPID PANEL
Chol/HDL Ratio: 3.6 ratio (ref 0.0–4.4)
Cholesterol, Total: 198 mg/dL (ref 100–199)
HDL: 55 mg/dL (ref >39)
LDL Chol Calc (NIH): 127 mg/dL — ABNORMAL HIGH (ref 0–99)
Triglycerides: 89 mg/dL (ref 0–149)
VLDL Cholesterol Cal: 16 mg/dL (ref 5–40)

## 2023-04-25 LAB — COMPREHENSIVE METABOLIC PANEL
ALT: 19 IU/L (ref 0–32)
AST: 17 IU/L (ref 0–40)
Albumin: 4.8 g/dL (ref 3.9–4.9)
Alkaline Phosphatase: 89 IU/L (ref 44–121)
BUN/Creatinine Ratio: 13 (ref 9–23)
BUN: 10 mg/dL (ref 6–24)
Bilirubin Total: 0.3 mg/dL (ref 0.0–1.2)
CO2: 25 mmol/L (ref 20–29)
Calcium: 9.7 mg/dL (ref 8.7–10.2)
Chloride: 101 mmol/L (ref 96–106)
Creatinine, Ser: 0.76 mg/dL (ref 0.57–1.00)
Globulin, Total: 2.4 g/dL (ref 1.5–4.5)
Glucose: 90 mg/dL (ref 70–99)
Potassium: 3.9 mmol/L (ref 3.5–5.2)
Sodium: 142 mmol/L (ref 134–144)
Total Protein: 7.2 g/dL (ref 6.0–8.5)
eGFR: 101 mL/min/{1.73_m2} (ref >59)

## 2023-04-25 LAB — CBC WITH DIFFERENTIAL/PLATELET
Basophils Absolute: 0.1 10*3/uL (ref 0.0–0.2)
Basos: 1 %
EOS (ABSOLUTE): 0.2 10*3/uL (ref 0.0–0.4)
Eos: 3 %
Hematocrit: 40.3 % (ref 34.0–46.6)
Hemoglobin: 13.1 g/dL (ref 11.1–15.9)
Immature Grans (Abs): 0 10*3/uL (ref 0.0–0.1)
Immature Granulocytes: 0 %
Lymphocytes Absolute: 2.2 10*3/uL (ref 0.7–3.1)
Lymphs: 29 %
MCH: 28.7 pg (ref 26.6–33.0)
MCHC: 32.5 g/dL (ref 31.5–35.7)
MCV: 88 fL (ref 79–97)
Monocytes Absolute: 0.6 10*3/uL (ref 0.1–0.9)
Monocytes: 7 %
Neutrophils Absolute: 4.6 10*3/uL (ref 1.4–7.0)
Neutrophils: 60 %
Platelets: 401 10*3/uL (ref 150–450)
RBC: 4.57 x10E6/uL (ref 3.77–5.28)
RDW: 13.8 % (ref 11.7–15.4)
WBC: 7.6 10*3/uL (ref 3.4–10.8)

## 2023-04-25 LAB — HEMOGLOBIN A1C
Est. average glucose Bld gHb Est-mCnc: 120 mg/dL
Hgb A1c MFr Bld: 5.8 % — ABNORMAL HIGH (ref 4.8–5.6)

## 2023-04-25 LAB — TSH: TSH: 1.72 u[IU]/mL (ref 0.450–4.500)

## 2023-04-25 LAB — HEPATITIS C ANTIBODY: Hep C Virus Ab: NONREACTIVE

## 2023-04-25 LAB — HIV ANTIBODY (ROUTINE TESTING W REFLEX): HIV Screen 4th Generation wRfx: NONREACTIVE

## 2023-04-25 NOTE — Telephone Encounter (Signed)
Her most recent CT abdomen (02/19/23)  and CT stone (09/03/22)  did not find presence of kidney stones. There was a gallstone present on most recent scan but no evidence of complications at that time. She mentioned back pain as we were preparing for her Pap smear but this was not evaluated during the visit as it was for a physical. If she would like to discuss this further she can schedule an office visit for evaluation and potential referral to specialty. I cannot send in a controlled medication without evaluation and appropriate, confirmed diagnosis.

## 2023-04-25 NOTE — Telephone Encounter (Signed)
Per Denny Peon, back pain was not discussed during her appointment yesterday. Patient will need an appointment to be evaluated and to have any medications sent in for back pain. Please call and see if the patient would like to be scheduled.
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
## 2023-04-26 ENCOUNTER — Encounter: Payer: Medicaid Other | Admitting: Nurse Practitioner

## 2023-04-26 LAB — CYTOLOGY - PAP
Adequacy: ABSENT
Chlamydia: NEGATIVE
Comment: NEGATIVE
Comment: NEGATIVE
Comment: NORMAL
Diagnosis: NEGATIVE
Neisseria Gonorrhea: NEGATIVE
Trichomonas: NEGATIVE

## 2023-04-26 NOTE — Telephone Encounter (Signed)
Called patient to schedule appt left message for patient to call the office and schedule appt.. Put in CRM for [facility]

## 2023-04-28 MED ORDER — FLUCONAZOLE 150 MG PO TABS
150.0000 mg | ORAL_TABLET | ORAL | 0 refills | Status: DC | PRN
Start: 2023-04-28 — End: 2023-06-20

## 2023-04-28 NOTE — Progress Notes (Signed)
Your labs are back  Your cholesterol looks pretty good right now- I recommend decreasing your consumption of saturated fats and exercising to make sure it does not get higher. Your A1c is 5.8. This places you in prediabetic range. No medications are indicated but we will need to monitor this. Please decrease your excess sugar and carbohydrate intake to make sure this does not progress further.  Your Blood count was normal  Your electrolytes, liver and kidney function were normal  Your thyroid was normal  Your Hep C and HIV testing were negative. Your testing for gonorrhea, chlamydia, trichomonas was negative as well. Your Pap was negative for signs of malignancy but we were not able to get a good sample. I recommend repeating it in one year to make sure there aren't issues. It did show some evidence of a yeast infection. I have sent in a script for Diflucan to treat this.

## 2023-04-28 NOTE — Addendum Note (Signed)
Addended by: Jacquelin Hawking on: 04/28/2023 11:20 AM   Modules accepted: Orders

## 2023-05-01 NOTE — Telephone Encounter (Signed)
Patient has dull/sharp pain in her back. Patient has been trying to handle the pain. Patient is working 2 1/2 day/week. Patient is wanting to discuss surgery. Patient advised per PCP notes- she needs to be seen and then a referral will be made if appropriate to surgeon. Offered appointment tomorrow- patient declines due to her work schedule- appointment scheduled for Thursday.

## 2023-05-04 ENCOUNTER — Ambulatory Visit: Payer: Medicaid Other | Admitting: Urology

## 2023-05-04 ENCOUNTER — Ambulatory Visit: Payer: Medicaid Other | Admitting: Physician Assistant

## 2023-05-05 ENCOUNTER — Ambulatory Visit: Payer: Medicaid Other | Admitting: Physician Assistant

## 2023-05-15 ENCOUNTER — Ambulatory Visit (INDEPENDENT_AMBULATORY_CARE_PROVIDER_SITE_OTHER): Payer: Medicaid Other | Admitting: Physician Assistant

## 2023-05-15 ENCOUNTER — Encounter: Payer: Self-pay | Admitting: Physician Assistant

## 2023-05-15 VITALS — BP 123/84 | HR 91 | Wt 197.2 lb

## 2023-05-15 DIAGNOSIS — K8020 Calculus of gallbladder without cholecystitis without obstruction: Secondary | ICD-10-CM

## 2023-05-15 DIAGNOSIS — B9689 Other specified bacterial agents as the cause of diseases classified elsewhere: Secondary | ICD-10-CM | POA: Diagnosis not present

## 2023-05-15 DIAGNOSIS — R319 Hematuria, unspecified: Secondary | ICD-10-CM

## 2023-05-15 DIAGNOSIS — N760 Acute vaginitis: Secondary | ICD-10-CM

## 2023-05-15 DIAGNOSIS — R300 Dysuria: Secondary | ICD-10-CM | POA: Diagnosis not present

## 2023-05-15 DIAGNOSIS — R350 Frequency of micturition: Secondary | ICD-10-CM | POA: Diagnosis not present

## 2023-05-15 DIAGNOSIS — R195 Other fecal abnormalities: Secondary | ICD-10-CM | POA: Diagnosis not present

## 2023-05-15 DIAGNOSIS — N390 Urinary tract infection, site not specified: Secondary | ICD-10-CM

## 2023-05-15 LAB — URINALYSIS, ROUTINE W REFLEX MICROSCOPIC
Bilirubin, UA: NEGATIVE
Glucose, UA: NEGATIVE
Ketones, UA: NEGATIVE
Nitrite, UA: NEGATIVE
Protein,UA: NEGATIVE
Specific Gravity, UA: 1.025 (ref 1.005–1.030)
Urobilinogen, Ur: 0.2 mg/dL (ref 0.2–1.0)
pH, UA: 6 (ref 5.0–7.5)

## 2023-05-15 LAB — WET PREP FOR TRICH, YEAST, CLUE
Clue Cell Exam: POSITIVE — AB
Trichomonas Exam: NEGATIVE
Yeast Exam: NEGATIVE

## 2023-05-15 LAB — MICROSCOPIC EXAMINATION: Bacteria, UA: NONE SEEN

## 2023-05-15 MED ORDER — PHENAZOPYRIDINE HCL 100 MG PO TABS
100.0000 mg | ORAL_TABLET | Freq: Three times a day (TID) | ORAL | 0 refills | Status: DC | PRN
Start: 2023-05-15 — End: 2023-06-20

## 2023-05-15 MED ORDER — METRONIDAZOLE 500 MG PO TABS
500.0000 mg | ORAL_TABLET | Freq: Two times a day (BID) | ORAL | 0 refills | Status: DC
Start: 2023-05-15 — End: 2023-06-20

## 2023-05-15 MED ORDER — NITROFURANTOIN MONOHYD MACRO 100 MG PO CAPS
100.0000 mg | ORAL_CAPSULE | Freq: Two times a day (BID) | ORAL | 0 refills | Status: AC
Start: 2023-05-15 — End: 2023-05-20

## 2023-05-15 NOTE — Progress Notes (Signed)
Your cervicovaginal swab was positive for bacterial vaginosis.  I have sent in a script for Flagyl to be taken by mouth twice per day for 7 days. Please finish the entire course unless you are instructed to stop or develop an allergic reaction. Please note that this medication can cause severe nausea and vomiting if alcohol is consumed while you are taking it so please refrain from this during the week you are on it. Please let us know if you have further questions or concerns.

## 2023-05-15 NOTE — Progress Notes (Unsigned)
Acute Office Visit   Patient: Briana Lyons   DOB: May 22, 1981   42 y.o. Female  MRN: [id number] Visit Date: 05/15/2023  Today's healthcare provider: Oswaldo Conroy Jocelyn Lowery, PA-C  Introduced myself to the patient as a Secondary school teacher and provided education on APPs in clinical practice.    Chief Complaint  Patient presents with   Back Pain    Patient says she was told by the Radiologist that she currently have seven gallstones and says she was told to be seen in order to get a referral to General Surgery to have the Gallbladder removed.    Abdominal Pain   Urinary Frequency   Subjective    HPI HPI     Back Pain    Additional comments: Patient says she was told by the Radiologist that she currently have seven gallstones and says she was told to be seen in order to get a referral to General Surgery to have the Gallbladder removed.       Last edited by Malen Gauze, CMA on 05/15/2023 10:56 AM.        Abdominal Pain  Onset: unsure  Duration: unsure  She reports intermittent epigastric pain that feels like burning  She reports recurrent nausea and vomiting States she is having abdominal pain even with eating, she reports reduced appetite lately and states she does not eat as much as she used to   She reports most recent bowel movements were very dark, black in color  Her last one like this was last night  She reports recurrent constipation and uses Milk of Magnesia and Dulcolax   She has taken a few iron pills this week   She reports her pain has progressed and she reports dysuria   She reports concerns for potential breast cyst on her left breast   She reports dysuria has been ongoing for about 4 days She reports suprapubic pain and soreness  Interventions: BC powder    Medications: Outpatient Medications Prior to Visit  Medication Sig   fluconazole (DIFLUCAN) 150 MG tablet Take 1 tablet (150 mg total) by mouth every three (3) days as needed. May repeat in 3 days if symptoms  not resolved (Patient not taking: Reported on 05/15/2023)   fluticasone (FLONASE) 50 MCG/ACT nasal spray Spray 2 sprays into both nostrils once daily. (Patient not taking: Reported on 05/15/2023)   omeprazole (PRILOSEC) 20 MG capsule Take 1 capsule (20 mg total) by mouth daily. (Patient not taking: Reported on 04/24/2023)   No facility-administered medications prior to visit.    Review of Systems  Constitutional:  Positive for chills. Negative for diaphoresis, fatigue and fever.  Respiratory:  Negative for chest tightness, shortness of breath and wheezing.   Cardiovascular:  Negative for chest pain and palpitations.  Gastrointestinal:  Positive for abdominal pain, nausea and vomiting. Negative for blood in stool and constipation.  Genitourinary:  Positive for dysuria and frequency. Negative for flank pain and hematuria.  Neurological:  Negative for dizziness, tremors, syncope, facial asymmetry and light-headedness.    {Insert previous labs (optional):23779}  {See past labs  Heme  Chem  Endocrine  Serology  Results Review (optional):1}   Objective    BP 123/84   Pulse 91   Wt 197 lb 3.2 oz (89.4 kg)   LMP 03/31/2023 (Approximate)   SpO2 98%   BMI 34.93 kg/m  {Insert last BP/Wt (optional):23777}  {See vitals history (optional):1}  Physical Exam Vitals reviewed.  Constitutional:  General: She is awake.     Appearance: Normal appearance. She is well-developed and well-groomed.  HENT:     Head: Normocephalic and atraumatic.  Pulmonary:     Effort: Pulmonary effort is normal.  Chest:  Breasts:    Left: Tenderness present. No swelling, bleeding, inverted nipple, mass, nipple discharge or skin change.     Comments: Patient declined chaperone for chest exam   Mild induration along left areola  with tenderness to palpation  Musculoskeletal:     Cervical back: Normal range of motion.  Neurological:     Mental Status: She is alert.  Psychiatric:        Behavior: Behavior  is cooperative.       Results for orders placed or performed in visit on 05/15/23  Microscopic Examination   Urine  Result Value Ref Range   WBC, UA 0-5 0 - 5 /hpf   RBC, Urine 3-10 (A) 0 - 2 /hpf   Epithelial Cells (non renal) 0-10 0 - 10 /hpf   Mucus, UA Present (A) Not Estab.   Bacteria, UA None seen None seen/Few  Urinalysis, Routine w reflex microscopic  Result Value Ref Range   Specific Gravity, UA 1.025 1.005 - 1.030   pH, UA 6.0 5.0 - 7.5   Color, UA Yellow Yellow   Appearance Ur Cloudy (A) Clear   Leukocytes,UA Trace (A) Negative   Protein,UA Negative Negative/Trace   Glucose, UA Negative Negative   Ketones, UA Negative Negative   RBC, UA 2+ (A) Negative   Bilirubin, UA Negative Negative   Urobilinogen, Ur 0.2 0.2 - 1.0 mg/dL   Nitrite, UA Negative Negative   Microscopic Examination See below:     Assessment & Plan      No follow-ups on file.       Problem List Items Addressed This Visit   None Visit Diagnoses     Urinary frequency    -  Primary   Relevant Orders   Urinalysis, Routine w reflex microscopic (Completed)   Urine Culture   WET PREP FOR TRICH, YEAST, CLUE   Dysuria       Relevant Orders   Urinalysis, Routine w reflex microscopic (Completed)   Urine Culture   WET PREP FOR TRICH, YEAST, CLUE   Microscopic Examination (Completed)   Dark stools       Relevant Orders   CBC w/Diff   Comp Met (CMET)   Gallstones       Relevant Orders   Ambulatory referral to General Surgery   Urinary tract infection with hematuria, site unspecified       Relevant Medications   nitrofurantoin, macrocrystal-monohydrate, (MACROBID) 100 MG capsule   phenazopyridine (PYRIDIUM) 100 MG tablet        No follow-ups on file.   I, Lashelle Koy E Antonino Nienhuis, PA-C, have reviewed all documentation for this visit. The documentation on 05/15/23 for the exam, diagnosis, procedures, and orders are all accurate and complete.   Jacquelin Hawking, MHS, PA-C [facility]

## 2023-05-15 NOTE — Patient Instructions (Addendum)
At this time I recommend the following to help with your symptoms:  I recommend taking Pepcid to help with your stomach pain and burning  Take this daily to help with the acid and indigestion  Make sure you are getting plenty of fiber in your diet to help with your constipation. You can use fiber supplements and gummies to help with increasing this.   I have sent in a referral to General Surgery for them to review your symptoms and decide if further intervention is warranted   I recommend stopping the use of BC powders at this time. They can be harsh on your stomach and may be causing some of your symptoms  You can use Tylenol as needed for pain  You can use Ibuprofen with food but try to use Tylenol more than the Ibuprofen   I have sent in a script for an antibiotic to help with your UTI- this is called Macrobid and should be taken twice per day   I have sent in a script for Pyridium to help with the urinary discomfort. This may turn your urine orange but should help with the discomfort   We will keep you updated on the results of your labs as they become available.

## 2023-05-16 DIAGNOSIS — K8020 Calculus of gallbladder without cholecystitis without obstruction: Secondary | ICD-10-CM

## 2023-05-16 HISTORY — DX: Calculus of gallbladder without cholecystitis without obstruction: K80.20

## 2023-05-16 LAB — CBC WITH DIFFERENTIAL/PLATELET
Basophils Absolute: 0 10*3/uL (ref 0.0–0.2)
Basos: 1 %
EOS (ABSOLUTE): 0.2 10*3/uL (ref 0.0–0.4)
Eos: 3 %
Hematocrit: 39 % (ref 34.0–46.6)
Hemoglobin: 12.7 g/dL (ref 11.1–15.9)
Immature Grans (Abs): 0 10*3/uL (ref 0.0–0.1)
Immature Granulocytes: 0 %
Lymphocytes Absolute: 2.1 10*3/uL (ref 0.7–3.1)
Lymphs: 24 %
MCH: 28.5 pg (ref 26.6–33.0)
MCHC: 32.6 g/dL (ref 31.5–35.7)
MCV: 88 fL (ref 79–97)
Monocytes Absolute: 0.8 10*3/uL (ref 0.1–0.9)
Monocytes: 9 %
Neutrophils Absolute: 5.6 10*3/uL (ref 1.4–7.0)
Neutrophils: 63 %
Platelets: 381 10*3/uL (ref 150–450)
RBC: 4.45 x10E6/uL (ref 3.77–5.28)
RDW: 13.7 % (ref 11.7–15.4)
WBC: 8.8 10*3/uL (ref 3.4–10.8)

## 2023-05-16 LAB — COMPREHENSIVE METABOLIC PANEL
ALT: 16 IU/L (ref 0–32)
AST: 17 IU/L (ref 0–40)
Albumin: 4.5 g/dL (ref 3.9–4.9)
Alkaline Phosphatase: 95 IU/L (ref 44–121)
BUN/Creatinine Ratio: 13 (ref 9–23)
BUN: 10 mg/dL (ref 6–24)
Bilirubin Total: 0.2 mg/dL (ref 0.0–1.2)
CO2: 25 mmol/L (ref 20–29)
Calcium: 9.5 mg/dL (ref 8.7–10.2)
Chloride: 103 mmol/L (ref 96–106)
Creatinine, Ser: 0.75 mg/dL (ref 0.57–1.00)
Globulin, Total: 2.5 g/dL (ref 1.5–4.5)
Glucose: 82 mg/dL (ref 70–99)
Potassium: 4.1 mmol/L (ref 3.5–5.2)
Sodium: 143 mmol/L (ref 134–144)
Total Protein: 7 g/dL (ref 6.0–8.5)
eGFR: 103 mL/min/{1.73_m2} (ref >59)

## 2023-05-16 NOTE — Assessment & Plan Note (Signed)
Chronic, ongoing per patient  She reports symptoms along abdomen comprised of pain, heartburn, stool changes Most recent CT abdomen from 02/19/23 demonstrates the following: Hepatobiliary: No suspicious focal abnormality within the liver parenchyma. Gallbladder lumen is filled with small calcified stones measuring up to about 7 mm. No gallbladder wall thickening or pericholecystic fluid. No intrahepatic or extrahepatic biliary dilation. Recommend starting Pepcid daily for heartburn symptoms and using antacids as needed for flares.  Provided information regarding dietary changes for gallstones in AVS Referral to General Surgery provided for further evaluation Follow up as needed for persistent or progressing symptoms

## 2023-05-16 NOTE — Progress Notes (Signed)
Labs are normal/stable.  No signs of anemia and your electrolytes were in normal ranges

## 2023-05-17 LAB — URINE CULTURE

## 2023-05-18 LAB — URINE CULTURE

## 2023-05-22 ENCOUNTER — Encounter: Payer: Self-pay | Admitting: Physician Assistant

## 2023-05-22 ENCOUNTER — Ambulatory Visit (INDEPENDENT_AMBULATORY_CARE_PROVIDER_SITE_OTHER): Payer: Medicaid Other | Admitting: Physician Assistant

## 2023-05-22 VITALS — BP 118/81 | HR 98 | Ht 63.0 in | Wt 200.4 lb

## 2023-05-22 DIAGNOSIS — N611 Abscess of the breast and nipple: Secondary | ICD-10-CM | POA: Diagnosis not present

## 2023-05-22 DIAGNOSIS — T3695XA Adverse effect of unspecified systemic antibiotic, initial encounter: Secondary | ICD-10-CM | POA: Diagnosis not present

## 2023-05-22 DIAGNOSIS — F322 Major depressive disorder, single episode, severe without psychotic features: Secondary | ICD-10-CM | POA: Diagnosis not present

## 2023-05-22 DIAGNOSIS — B379 Candidiasis, unspecified: Secondary | ICD-10-CM

## 2023-05-22 MED ORDER — FLUCONAZOLE 150 MG PO TABS
150.0000 mg | ORAL_TABLET | ORAL | 0 refills | Status: DC | PRN
Start: 2023-05-22 — End: 2023-06-20

## 2023-05-22 MED ORDER — SULFAMETHOXAZOLE-TRIMETHOPRIM 800-160 MG PO TABS
1.0000 | ORAL_TABLET | Freq: Two times a day (BID) | ORAL | 0 refills | Status: AC
Start: 2023-05-22 — End: 2023-05-29

## 2023-05-22 MED ORDER — KETOROLAC TROMETHAMINE 30 MG/ML IJ SOLN
30.0000 mg | Freq: Once | INTRAMUSCULAR | Status: AC
Start: 2023-05-22 — End: 2023-05-22
  Administered 2023-05-22: 30 mg via INTRAMUSCULAR
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
## 2023-05-22 NOTE — Assessment & Plan Note (Signed)
Suspect chronic, ongoing PHQ9 and GAD7 are improved from previous visit  She declines discussion today, stating she would prefer to discuss with Psychiatry once she is established there Will defer to their recommendations- offered initiating regimen today but this was declined. Follow up in about 3 months to discuss Psychiatry recommendations or sooner if concerns arise

## 2023-05-22 NOTE — Progress Notes (Signed)
Your urine culture shows that the bacteria causing your UTI was susceptible to the antibiotic that was sent in last week. Please make sure you have completed the entire course to make sure it is fully resolved.
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
## 2023-05-25 ENCOUNTER — Ambulatory Visit (INDEPENDENT_AMBULATORY_CARE_PROVIDER_SITE_OTHER): Payer: Medicaid Other | Admitting: Surgery

## 2023-05-25 VITALS — BP 137/75 | HR 112 | Temp 98.4°F | Ht 63.0 in | Wt 194.4 lb

## 2023-05-25 DIAGNOSIS — K8010 Calculus of gallbladder with chronic cholecystitis without obstruction: Secondary | ICD-10-CM

## 2023-05-25 DIAGNOSIS — N611 Abscess of the breast and nipple: Secondary | ICD-10-CM | POA: Diagnosis not present

## 2023-05-25 NOTE — Patient Instructions (Addendum)
If you have any concerns or questions, please feel free to call our office.   Wound Packing  Wound packing usually involves placing a moistened packing material into your wound and then covering it with an outer bandage (dressing). This helps support the healing of deep tissue and tissue under the skin. It also helps prevent bleeding, infection, and further injury. Wounds are packed until deep tissues heal. The time it takes for this to happen is different for everyone. Your health care provider will show you how to pack and dress your wound. Using gloves and a clean technique is important to avoid spreading germs into your wound. Supplies needed: Soap and water. Disposable gloves. Cleansing or wetting solution, such as saline, germ-free (sterile) water, or an antiseptic solution. Clean bowl. Clean packing material, such as gauze, gauze sponges, or rolled gauze. Clean paper towels. Outer dressing. This includes the cover dressing and tape, or a dressing with an adhesive border. Cotton-tipped swabs. Small plastic bag for trash. How to pack your wound Follow your health care provider's instructions on how often you need to change dressings and pack your wound. You will likely be asked to change your dressings 1 to 2 times a day. Preparing to change the wound packing If needed, take pain medicine 30 minutes before you pack your wound as told by your health care provider. Preparing the new packing material  Clean and disinfect your work surface or countertop. Set a plastic bag on or near your work surface. Wash your hands with soap and water for at least 20 seconds before you change the dressing. If soap and water are not available, use hand sanitizer. Put a clean paper towel on the counter. Put a clean bowl on the towel. Only touch the outside of the bowl when handling it. Pour the cleansing or wetting solution that your health care provider tells you to use into the bowl. Select and cut  your packing material to fit the size of your wound. Avoid using multiple pieces of packing material. Drop it into the bowl. Cut tape strips that you will use to seal the outer dressing, if needed. Put gauze pads for cleansing and cotton-tipped swabs on the clean paper towel. Removing the old packing material and dressing Put on a set of gloves. Gently remove the old dressing and packing material. Make sure to check how the drainage looks or if there is any odor. Clean or rinse (irrigate) the wound. Remove your gloves. Put the removed items, including gloves, into the plastic bag to throw away later. Wash your hands again with soap and water for at least 20 seconds. If soap and water are not available, use hand sanitizer. Applying the new packing material and dressing  Put on a new set of gloves. Squeeze the packing material in the bowl to release the extra liquid. The packing material should be moist, but not dripping wet. Gently place the packing material into the wound. Use a cotton-tipped swab to guide it into place, filling all of the space. Do not overpack the wound bed. Dry your gloved fingertips on the paper towel. Open up your outer dressing supplies and put them on a dry part of the paper towel. Keep them from getting wet. Place the outer dressing over the packed wound. Tape the edges of the outer dressing in place. Remove your gloves. Wash your hands again with soap and water for at least 20 seconds. If soap and water are not available, use hand sanitizer. Put  the removed items, including gloves, into the plastic bag to throw away. Clean and disinfect your work surface or countertop. General tips Follow your health care provider's instructions on how much to pack the wound. At first, you may need to pack it more fully to help stop bleeding. As the wound begins to heal inside, you will use less packing material and pack the wound loosely to allow the tissue to heal slowly from the  inside out. Do not take baths, swim, or use a hot tub until your health care provider approves. Ask your health care provider if you may take showers. You may only be allowed to take sponge baths. Keep the dressing clean and dry. Follow any other instructions given by your health care provider on how to aid healing. This may include applying warm or cold compresses, raising (elevating) the affected area, or wearing a compression dressing. Check your wound site every day for signs of infection. Check for: More redness, swelling, or pain. More fluid or blood. Warmth or hardness (induration). Pus or a bad smell. Protect your wound from the sun when you are outside for the first 6 months, or for as long as told by your health care provider. Cover up the scar area or apply sunscreen that has an SPF of at least 30. Keep all follow-up visits. This is important. Contact a health care provider if: Your pain is not controlled with pain medicine. You have more drainage, redness, swelling, or pain at your wound site. You have new rash, warmth, or induration around the wound. You have a fever or chills. Your wound becomes larger or deeper. Get help right away if: The tissue inside your wound changes color from pink to white, yellow, or black. You notice a bad smell or pus coming from the wound site. You are having trouble packing your wound. Your wound is bleeding, and the bleeding does not stop with gentle pressure. These symptoms may represent a serious problem that is an emergency. Do not wait to see if the symptoms will go away. Get medical help right away. Call your local emergency services (911 in the [place] Do not drive yourself to the hospital. Summary Wound packing usually involves placing a moistened packing material into your wound and then covering it with an outer bandage (dressing). Follow your health care provider's instructions on how often you need to change dressings and pack your  wound. You will likely be asked to change dressings 1 to 2 times a day. When packing your wound, it is important to use gloves to avoid spreading germs into the wound. Check your wound site every day for signs of infection. This information is not intended to replace advice given to you by your health care provider. Make sure you discuss any questions you have with your health care provider. Document Revised: 02/16/2021 Document Reviewed: 02/16/2021 Elsevier Patient Education  2024 [employer]   Incision and Drainage, Care After  After incision and drainage, it is common to have: Pain or discomfort around the incision site. Blood, fluid, or pus (drainage) from the incision. Redness and firm skin around the incision site. Follow these instructions at home: Medicines Take over-the-counter and prescription medicines only as told by your health care provider. If you were prescribed antibiotics, take them as told by your provider. Do not stop using the antibiotic even if you start to feel better. Do not apply creams, ointments, or liquids unless you have been told to by your provider. Wound care  Follow instructions from your provider about how to take care of your wound. Make sure you: Wash your hands with soap and water for at least 20 seconds before and after you change your bandage (dressing). If soap and water are not available, use hand sanitizer. Change your dressing and any packing as told by your provider. If the dressing is dry or stuck when you try to remove it, moisten or wet it with saline or water. This will help you remove it without harming your skin or tissues. If your wound is packed, leave it in place until your provider tells you to remove it. To remove it, moisten or wet the packing with saline or water. Leave stitches (sutures), skin glue, or tape strips in place. These skin closures may need to stay in place for 2 weeks or longer. If tape strip edges start to loosen and  curl up, you may trim the loose edges. Do not remove tape strips completely unless your provider tells you to do that. Check your wound every day for signs of infection. Check for: More redness, swelling, or pain. More fluid or blood. Warmth. Pus or a bad smell. If you were sent home with a drain tube in place, follow instructions from your provider about: How to empty it. How to care for it at home. Be careful when you get rid of used dressings, wound packing, or drainage. Activity Rest the affected area. Return to your normal activities as told by your provider. Ask your provider what activities are safe for you. General instructions Do not use any products that contain nicotine or tobacco. These products include cigarettes, chewing tobacco, and vaping devices, such as e-cigarettes. These can delay incision healing after surgery. If you need help quitting, ask your provider. Do not take baths, swim, or use a hot tub until your provider approves. Ask your provider if you may take showers. You may only be allowed to take sponge baths. The incision will keep draining. It is normal to have some clear or slightly bloody drainage. The amount of drainage should go down each day. Keep all follow-up visits. Your provider will need to make sure that your incision is healing well and that there are no problems. Your health care provider may give you more instructions. Make sure you know what you can and cannot do Contact a health care provider if: Your cyst or abscess comes back. You have any signs of infection. You notice red streaks that spread away from the incision site. You have a fever or chills. Get help right away if: You have severe pain or bleeding. You become short of breath. You have chest pain. You have signs of a severe infection. You may notice changes in your incision area, such as: Swelling that makes the skin feel hard. Numbness or tingling. Sudden increase in redness. Your  skin color may change from red to purple, and then to dark spots. Blisters, ulcers, or splitting of the skin. These symptoms may be an emergency. Get help right away. Call 911. Do not wait to see if the symptoms will go away. Do not drive yourself to the hospital. This information is not intended to replace advice given to you by your health care provider. Make sure you discuss any questions you have with your health care provider. Document Revised: 05/30/2022 Document Reviewed: 05/30/2022 Elsevier Patient Education  2024 [employer].

## 2023-05-26 ENCOUNTER — Telehealth: Payer: Self-pay

## 2023-05-26 NOTE — Telephone Encounter (Signed)
Pt called for lab results. Shared no provider's note. No further questions.  Oswaldo Conroy Mecum, PA-C 05/22/2023  1:49 PM EDT     Your urine culture shows that the bacteria causing your UTI was susceptible to the antibiotic that was sent in last week. Please make sure you have completed the entire course to make sure it is fully resolved.   Oswaldo Conroy Mecum, PA-C 05/16/2023  4:56 PM EDT     Labs are normal/stable. No signs of anemia and your electrolytes were in normal ranges

## 2023-06-01 ENCOUNTER — Encounter: Payer: Medicaid Other | Admitting: Surgery

## 2023-06-20 ENCOUNTER — Encounter: Payer: Self-pay | Admitting: Surgery

## 2023-06-20 ENCOUNTER — Ambulatory Visit (INDEPENDENT_AMBULATORY_CARE_PROVIDER_SITE_OTHER): Payer: Medicaid Other | Admitting: Surgery

## 2023-06-20 ENCOUNTER — Ambulatory Visit: Payer: Self-pay | Admitting: Surgery

## 2023-06-20 VITALS — BP 140/90 | HR 83 | Temp 98.6°F | Ht 63.0 in | Wt 199.0 lb

## 2023-06-20 DIAGNOSIS — K8010 Calculus of gallbladder with chronic cholecystitis without obstruction: Secondary | ICD-10-CM | POA: Insufficient documentation

## 2023-06-20 HISTORY — DX: Calculus of gallbladder with chronic cholecystitis without obstruction: K80.10

## 2023-06-20 MED ORDER — MUPIROCIN CALCIUM 2 % EX CREA: 1.0000 | TOPICAL_CREAM | Freq: Two times a day (BID) | CUTANEOUS | 0 refills | Status: DC
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
## 2023-06-20 NOTE — Patient Instructions (Signed)
You have requested to have your gallbladder removed. This will be done at [facility] with Dr. Rodenberg.  You will most likely be out of work 1-2 weeks for this surgery.  If you have FMLA or disability paperwork that needs filled out you may drop this off at our office or this can be faxed to [phone].  You will return after your post-op appointment with a lifting restriction for approximately 4 more weeks.  You will be able to eat anything you would like to following surgery. But, start by eating a bland diet and advance this as tolerated. The Gallbladder diet is below, please go as closely by this diet as possible prior to surgery to avoid any further attacks.  Please see the (blue)pre-care form that you have been given today. Our surgery scheduler will call you to verify surgery date and to go over information.   If you have any questions, please call our office.  Laparoscopic Cholecystectomy Laparoscopic cholecystectomy is surgery to remove the gallbladder. The gallbladder is located in the upper right part of the abdomen, behind the liver. It is a storage sac for bile, which is produced in the liver. Bile aids in the digestion and absorption of fats. Cholecystectomy is often done for inflammation of the gallbladder (cholecystitis). This condition is usually caused by a buildup of gallstones (cholelithiasis) in the gallbladder. Gallstones can block the flow of bile, and that can result in inflammation and pain. In severe cases, emergency surgery may be required. If emergency surgery is not required, you will have time to prepare for the procedure. Laparoscopic surgery is an alternative to open surgery. Laparoscopic surgery has a shorter recovery time. Your common bile duct may also need to be examined during the procedure. If stones are found in the common bile duct, they may be removed. LET YOUR HEALTH CARE PROVIDER KNOW ABOUT: Any allergies you have. All medicines you are taking,  including vitamins, herbs, eye drops, creams, and over-the-counter medicines. Previous problems you or members of your family have had with the use of anesthetics. Any blood disorders you have. Previous surgeries you have had.  Any medical conditions you have. RISKS AND COMPLICATIONS Generally, this is a safe procedure. However, problems may occur, including: Infection. Bleeding. Allergic reactions to medicines. Damage to other structures or organs. A stone remaining in the common bile duct. A bile leak from the cyst duct that is clipped when your gallbladder is removed. The need to convert to open surgery, which requires a larger incision in the abdomen. This may be necessary if your surgeon thinks that it is not safe to continue with a laparoscopic procedure. BEFORE THE PROCEDURE Ask your health care provider about: Changing or stopping your regular medicines. This is especially important if you are taking diabetes medicines or blood thinners. Taking medicines such as aspirin and ibuprofen. These medicines can thin your blood. Do not take these medicines before your procedure if your health care provider instructs you not to. Follow instructions from your health care provider about eating or drinking restrictions. Let your health care provider know if you develop a cold or an infection before surgery. Plan to have someone take you home after the procedure. Ask your health care provider how your surgical site will be marked or identified. You may be given antibiotic medicine to help prevent infection. PROCEDURE To reduce your risk of infection: Your health care team will wash or sanitize their hands. Your skin will be washed with soap. An IV   tube may be inserted into one of your veins. You will be given a medicine to make you fall asleep (general anesthetic). A breathing tube will be placed in your mouth. The surgeon will make several small cuts (incisions) in your abdomen. A thin,  lighted tube (laparoscope) that has a tiny camera on the end will be inserted through one of the small incisions. The camera on the laparoscope will send a picture to a TV screen (monitor) in the operating room. This will give the surgeon a good view inside your abdomen. A gas will be pumped into your abdomen. This will expand your abdomen to give the surgeon more room to perform the surgery. Other tools that are needed for the procedure will be inserted through the other incisions. The gallbladder will be removed through one of the incisions. After your gallbladder has been removed, the incisions will be closed with stitches (sutures), staples, or skin glue. Your incisions may be covered with a bandage (dressing). The procedure may vary among health care providers and hospitals. AFTER THE PROCEDURE Your blood pressure, heart rate, breathing rate, and blood oxygen level will be monitored often until the medicines you were given have worn off. You will be given medicines as needed to control your pain.   This information is not intended to replace advice given to you by your health care provider. Make sure you discuss any questions you have with your health care provider.   Document Released: 10/10/2005 Document Revised: 07/01/2015 Document Reviewed: 05/22/2013 Elsevier Interactive Patient Education 2016 [employer]   Low-Fat Diet for Gallbladder Conditions A low-fat diet can be helpful if you have pancreatitis or a gallbladder condition. With these conditions, your pancreas and gallbladder have trouble digesting fats. A healthy eating plan with less fat will help rest your pancreas and gallbladder and reduce your symptoms. WHAT DO I NEED TO KNOW ABOUT THIS DIET? Eat a low-fat diet. Reduce your fat intake to less than 20-30% of your total daily calories. This is less than 50-60 g of fat per day. Remember that you need some fat in your diet. Ask your dietician what your daily goal should  be. Choose nonfat and low-fat healthy foods. Look for the words "nonfat," "low fat," or "fat free." As a guide, look on the label and choose foods with less than 3 g of fat per serving. Eat only one serving. Avoid alcohol. Do not smoke. If you need help quitting, talk with your health care provider. Eat small frequent meals instead of three large heavy meals. WHAT FOODS CAN I EAT? Grains Include healthy grains and starches such as potatoes, wheat bread, fiber-rich cereal, and brown rice. Choose whole grain options whenever possible. In adults, whole grains should account for 45-65% of your daily calories.  Fruits and Vegetables Eat plenty of fruits and vegetables. Fresh fruits and vegetables add fiber to your diet. Meats and Other Protein Sources Eat lean meat such as chicken and pork. Trim any fat off of meat before cooking it. Eggs, fish, and beans are other sources of protein. In adults, these foods should account for 10-35% of your daily calories. Dairy Choose low-fat milk and dairy options. Dairy includes fat and protein, as well as calcium.  Fats and Oils Limit high-fat foods such as fried foods, sweets, baked goods, sugary drinks.  Other Creamy sauces and condiments, such as mayonnaise, can add extra fat. Think about whether or not you need to use them, or use smaller amounts or low fat options.   WHAT FOODS ARE NOT RECOMMENDED? High fat foods, such as: Baked goods. Ice cream. French toast. Sweet rolls. Pizza. Cheese bread. Foods covered with batter, butter, creamy sauces, or cheese. Fried foods. Sugary drinks and desserts. Foods that cause gas or bloating   This information is not intended to replace advice given to you by your health care provider. Make sure you discuss any questions you have with your health care provider.   Document Released: 10/15/2013 Document Reviewed: 10/15/2013 Elsevier Interactive Patient Education 2016 Elsevier Inc.   

## 2023-06-21 ENCOUNTER — Telehealth: Payer: Self-pay | Admitting: Surgery

## 2023-06-21 NOTE — Telephone Encounter (Signed)
Patient has been advised of Pre-Admission date/time, and Surgery date at [facility].  Surgery Date: 07/17/23 Preadmission Testing Date: 07/10/23 (phone 8a-1p)  Patient has been made aware to call [phone], between 1-3:00pm the day before surgery, to find out what time to arrive for surgery.

## 2023-07-10 ENCOUNTER — Encounter
Admission: RE | Admit: 2023-07-10 | Discharge: 2023-07-10 | Disposition: A | Discharge status: Home / Self Care | Payer: Medicaid Other | Source: Ambulatory Visit | Attending: Surgery | Admitting: Surgery

## 2023-07-10 ENCOUNTER — Other Ambulatory Visit: Payer: Self-pay

## 2023-07-10 VITALS — Ht 63.0 in | Wt 196.0 lb

## 2023-07-10 DIAGNOSIS — Z01818 Encounter for other preprocedural examination: Secondary | ICD-10-CM

## 2023-07-10 HISTORY — DX: Gastro-esophageal reflux disease without esophagitis: K21.9

## 2023-07-10 HISTORY — DX: Bipolar disorder, unspecified: F31.9

## 2023-07-10 NOTE — Patient Instructions (Addendum)
Your procedure is scheduled on: Monday 07/17/23 To find out your arrival time, please call [phone] between 1PM - 3PM on:   Friday 07/14/23 Report to the Registration Desk on the 1st floor of the [facility]. FREE Valet parking is available.  If your arrival time is 6:00 am, do not arrive before that time as the [facility] entrance doors do not open until 6:00 am.  REMEMBER: Instructions that are not followed completely may result in serious medical risk, up to and including death; or upon the discretion of your surgeon and anesthesiologist your surgery may need to be rescheduled.  Do not eat food or drink any liquids after midnight the night before surgery.  No gum chewing or hard candies.  One week prior to surgery: Stop Anti-inflammatories (NSAIDS) such as Advil, Aleve, Ibuprofen, Motrin, Naproxen, Naprosyn and Aspirin based products such as Excedrin, Goody's Powder, BC Powder. You may however, continue to take Tylenol if needed for pain up until the day of surgery.  Stop ANY OVER THE COUNTER supplements until after surgery.  Continue taking all prescribed medications.  TAKE ONLY THESE MEDICATIONS THE MORNING OF SURGERY WITH A SIP OF WATER:  none  No Alcohol for 24 hours before or after surgery.  No Smoking including e-cigarettes for 24 hours before surgery.  No chewable tobacco products for at least 6 hours before surgery.  No nicotine patches on the day of surgery.  Do not use any "recreational" drugs for at least a week (preferably 2 weeks) before your surgery.  Please be advised that the combination of cocaine and anesthesia may have negative outcomes, up to and including death. If you test positive for cocaine, your surgery will be cancelled.  On the morning of surgery brush your teeth with toothpaste and water, you may rinse your mouth with mouthwash if you wish. Do not swallow any toothpaste or mouthwash.  Use CHG Soap or wipes as directed on instruction  sheet.  Do not wear lotions, powders, or perfumes.   Do not shave body hair from the neck down 48 hours before surgery.  Wear comfortable clothing (specific to your surgery type) to the hospital.  Do not wear jewelry, make-up, hairpins, clips or nail polish.  Contact lenses, hearing aids and dentures may not be worn into surgery.  Do not bring valuables to the hospital. [facility] is not responsible for any missing/lost belongings or valuables.   Notify your doctor if there is any change in your medical condition (cold, fever, infection).  If you are being discharged the day of surgery, you will not be allowed to drive home. You will need a responsible individual to drive you home and stay with you for 24 hours after surgery.   If you are taking public transportation, you will need to have a responsible individual with you.  If you are being admitted to the hospital overnight, leave your suitcase in the car. After surgery it may be brought to your room.  In case of increased patient census, it may be necessary for you, the patient, to continue your postoperative care in the Same Day Surgery department.  After surgery, you can help prevent lung complications by doing breathing exercises.  Take deep breaths and cough every 1-2 hours. Your doctor may order a device called an Incentive Spirometer to help you take deep breaths. When coughing or sneezing, hold a pillow firmly against your incision with both hands. This is called "splinting." Doing this helps protect your incision. It  also decreases belly discomfort.  Surgery Visitation Policy:  Patients undergoing a surgery or procedure may have two family members or support persons with them as long as the person is not COVID-19 positive or experiencing its symptoms.   Inpatient Visitation:    Visiting hours are 7 a.m. to 8 p.m. Up to four visitors are allowed at one time in a patient room. The visitors may rotate out with other  people during the day. One designated support person (adult) may remain overnight.  Please call the Pre-admissions Testing Dept. at [phone] if you have any questions about these instructions.     Preparing for Surgery with CHLORHEXIDINE GLUCONATE (CHG) Soap  Chlorhexidine Gluconate (CHG) Soap  o An antiseptic cleaner that kills germs and bonds with the skin to continue killing germs even after washing  o Used for showering the night before surgery and morning of surgery  Before surgery, you can play an important role by reducing the number of germs on your skin.  CHG (Chlorhexidine gluconate) soap is an antiseptic cleanser which kills germs and bonds with the skin to continue killing germs even after washing.  Please do not use if you have an allergy to CHG or antibacterial soaps. If your skin becomes reddened/irritated stop using the CHG.  1. Shower the NIGHT BEFORE SURGERY and the MORNING OF SURGERY with CHG soap.  2. If you choose to wash your hair, wash your hair first as usual with your normal shampoo.  3. After shampooing, rinse your hair and body thoroughly to remove the shampoo.  4. Use CHG as you would any other liquid soap. You can apply CHG directly to the skin and wash gently with a scrungie or a clean washcloth.  5. Apply the CHG soap to your body only from the neck down. Do not use on open wounds or open sores. Avoid contact with your eyes, ears, mouth, and genitals (private parts). Wash face and genitals (private parts) with your normal soap.  6. Wash thoroughly, paying special attention to the area where your surgery will be performed.  7. Thoroughly rinse your body with warm water.  8. Do not shower/wash with your normal soap after using and rinsing off the CHG soap.  9. Pat yourself dry with a clean towel.  10. Wear clean pajamas to bed the night before surgery.  12. Place clean sheets on your bed the night of your first shower and do not sleep with  pets.  13. Shower again with the CHG soap on the day of surgery prior to arriving at the hospital.  14. Do not apply any deodorants/lotions/powders.  15. Please wear clean clothes to the hospital.

## 2023-07-10 NOTE — Pre-Procedure Instructions (Signed)
Patient stated she was not using mupiricin ointment as directed by Dr Claudine Mouton b/c the ointment was not covered by her insurance and was too expensive. She was encouraged to come by our office to pick up a copy of her written PAT instructions and CHG soap to use prior to her surgery.

## 2023-07-17 ENCOUNTER — Other Ambulatory Visit: Payer: Self-pay

## 2023-07-17 ENCOUNTER — Ambulatory Visit: Payer: Medicaid Other | Admitting: Anesthesiology

## 2023-07-17 ENCOUNTER — Encounter
Admission: RE | Disposition: A | Discharge status: Home / Self Care | Payer: Self-pay | Source: Home / Self Care | Attending: Surgery

## 2023-07-17 ENCOUNTER — Ambulatory Visit
Admission: RE | Admit: 2023-07-17 | Discharge: 2023-07-17 | Disposition: A | Discharge status: Home / Self Care | Payer: Medicaid Other | Attending: Surgery | Admitting: Surgery

## 2023-07-17 ENCOUNTER — Encounter: Payer: Self-pay | Admitting: Surgery

## 2023-07-17 DIAGNOSIS — F17210 Nicotine dependence, cigarettes, uncomplicated: Secondary | ICD-10-CM | POA: Diagnosis not present

## 2023-07-17 DIAGNOSIS — K8010 Calculus of gallbladder with chronic cholecystitis without obstruction: Secondary | ICD-10-CM | POA: Insufficient documentation

## 2023-07-17 DIAGNOSIS — Z01818 Encounter for other preprocedural examination: Secondary | ICD-10-CM

## 2023-07-17 DIAGNOSIS — F17290 Nicotine dependence, other tobacco product, uncomplicated: Secondary | ICD-10-CM | POA: Diagnosis not present

## 2023-07-17 LAB — POCT PREGNANCY, URINE: Preg Test, Ur: NEGATIVE

## 2023-07-17 SURGERY — CHOLECYSTECTOMY, ROBOT-ASSISTED, LAPAROSCOPIC
Anesthesia: General | Site: Abdomen

## 2023-07-17 MED ORDER — BUPIVACAINE-EPINEPHRINE (PF) 0.25% -1:200000 IJ SOLN
INTRAMUSCULAR | Status: AC
  Filled 2023-07-17: qty 30

## 2023-07-17 MED ORDER — INDOCYANINE GREEN 25 MG IV SOLR
1.2500 mg | Freq: Once | INTRAVENOUS | Status: AC
  Administered 2023-07-17: 1.25 mg via INTRAVENOUS

## 2023-07-17 MED ORDER — ONDANSETRON HCL 4 MG/2ML IJ SOLN
INTRAMUSCULAR | Status: DC | PRN
  Administered 2023-07-17: 4 mg via INTRAVENOUS

## 2023-07-17 MED ORDER — MIDAZOLAM HCL 2 MG/2ML IJ SOLN
INTRAMUSCULAR | Status: DC | PRN
  Administered 2023-07-17: 2 mg via INTRAVENOUS

## 2023-07-17 MED ORDER — ACETAMINOPHEN 500 MG PO TABS
ORAL_TABLET | ORAL | Status: AC
  Filled 2023-07-17: qty 1

## 2023-07-17 MED ORDER — FENTANYL CITRATE (PF) 100 MCG/2ML IJ SOLN
25.0000 ug | INTRAMUSCULAR | Status: DC | PRN
  Administered 2023-07-17 (×2): 25 ug via INTRAVENOUS

## 2023-07-17 MED ORDER — FENTANYL CITRATE (PF) 100 MCG/2ML IJ SOLN
INTRAMUSCULAR | Status: AC
  Filled 2023-07-17: qty 2

## 2023-07-17 MED ORDER — KETOROLAC TROMETHAMINE 30 MG/ML IJ SOLN
INTRAMUSCULAR | Status: AC
  Filled 2023-07-17: qty 1

## 2023-07-17 MED ORDER — FENTANYL CITRATE (PF) 100 MCG/2ML IJ SOLN
INTRAMUSCULAR | Status: DC | PRN
  Administered 2023-07-17: 100 ug via INTRAVENOUS

## 2023-07-17 MED ORDER — LACTATED RINGERS IV SOLN: INTRAVENOUS | Status: DC

## 2023-07-17 MED ORDER — CELECOXIB 200 MG PO CAPS
ORAL_CAPSULE | ORAL | Status: AC
  Filled 2023-07-17: qty 1

## 2023-07-17 MED ORDER — INDOCYANINE GREEN 25 MG IV SOLR
INTRAVENOUS | Status: AC
  Filled 2023-07-17: qty 10

## 2023-07-17 MED ORDER — CEFAZOLIN SODIUM-DEXTROSE 2-4 GM/100ML-% IV SOLN
INTRAVENOUS | Status: AC
  Filled 2023-07-17: qty 100

## 2023-07-17 MED ORDER — OXYCODONE HCL 5 MG PO TABS
5.0000 mg | ORAL_TABLET | Freq: Once | ORAL | Status: AC | PRN
  Administered 2023-07-17: 5 mg via ORAL

## 2023-07-17 MED ORDER — VANCOMYCIN HCL IN DEXTROSE 1-5 GM/200ML-% IV SOLN
1000.0000 mg | INTRAVENOUS | Status: AC
  Administered 2023-07-17: 1000 mg via INTRAVENOUS

## 2023-07-17 MED ORDER — VANCOMYCIN HCL IN DEXTROSE 1-5 GM/200ML-% IV SOLN
INTRAVENOUS | Status: AC
  Filled 2023-07-17: qty 200

## 2023-07-17 MED ORDER — KETOROLAC TROMETHAMINE 30 MG/ML IJ SOLN
INTRAMUSCULAR | Status: DC | PRN
  Administered 2023-07-17: 30 mg via INTRAVENOUS

## 2023-07-17 MED ORDER — CHLORHEXIDINE GLUCONATE CLOTH 2 % EX PADS
6.0000 | MEDICATED_PAD | Freq: Once | CUTANEOUS | Status: AC
  Administered 2023-07-17: 6 via TOPICAL

## 2023-07-17 MED ORDER — HYDROCODONE-ACETAMINOPHEN 5-325 MG PO TABS: 1.0000 | ORAL_TABLET | Freq: Four times a day (QID) | ORAL | 0 refills | Status: DC | PRN

## 2023-07-17 MED ORDER — BUPIVACAINE LIPOSOME 1.3 % IJ SUSP: 20.0000 mL | Freq: Once | INTRAMUSCULAR | Status: DC

## 2023-07-17 MED ORDER — ROCURONIUM BROMIDE 100 MG/10ML IV SOLN
INTRAVENOUS | Status: DC | PRN
  Administered 2023-07-17: 50 mg via INTRAVENOUS

## 2023-07-17 MED ORDER — 0.9 % SODIUM CHLORIDE (POUR BTL) OPTIME
TOPICAL | Status: DC | PRN
  Administered 2023-07-17: 500 mL

## 2023-07-17 MED ORDER — GABAPENTIN 300 MG PO CAPS
ORAL_CAPSULE | ORAL | Status: AC
  Filled 2023-07-17: qty 1

## 2023-07-17 MED ORDER — OXYCODONE HCL 5 MG/5ML PO SOLN: 5.0000 mg | Freq: Once | ORAL | Status: AC | PRN

## 2023-07-17 MED ORDER — MIDAZOLAM HCL 2 MG/2ML IJ SOLN
INTRAMUSCULAR | Status: AC
  Filled 2023-07-17: qty 2

## 2023-07-17 MED ORDER — BUPIVACAINE-EPINEPHRINE 0.25% -1:200000 IJ SOLN
INTRAMUSCULAR | Status: DC | PRN
  Administered 2023-07-17: 50 mL via INTRAMUSCULAR

## 2023-07-17 MED ORDER — ACETAMINOPHEN 500 MG PO TABS
ORAL_TABLET | ORAL | Status: AC
  Filled 2023-07-17: qty 2

## 2023-07-17 MED ORDER — BUPIVACAINE LIPOSOME 1.3 % IJ SUSP
INTRAMUSCULAR | Status: AC
  Filled 2023-07-17: qty 20

## 2023-07-17 MED ORDER — ONDANSETRON HCL 4 MG/2ML IJ SOLN: 4.0000 mg | Freq: Once | INTRAMUSCULAR | Status: DC | PRN

## 2023-07-17 MED ORDER — OXYCODONE HCL 5 MG PO TABS
ORAL_TABLET | ORAL | Status: AC
  Filled 2023-07-17: qty 1

## 2023-07-17 MED ORDER — DEXAMETHASONE SODIUM PHOSPHATE 10 MG/ML IJ SOLN
INTRAMUSCULAR | Status: DC | PRN
  Administered 2023-07-17: 10 mg via INTRAVENOUS

## 2023-07-17 MED ORDER — SUGAMMADEX SODIUM 200 MG/2ML IV SOLN
INTRAVENOUS | Status: DC | PRN
  Administered 2023-07-17: 200 mg via INTRAVENOUS

## 2023-07-17 MED ORDER — CELECOXIB 200 MG PO CAPS
200.0000 mg | ORAL_CAPSULE | ORAL | Status: AC
  Administered 2023-07-17: 200 mg via ORAL

## 2023-07-17 MED ORDER — IBUPROFEN 800 MG PO TABS: 800.0000 mg | ORAL_TABLET | Freq: Three times a day (TID) | ORAL | 0 refills | Status: DC | PRN

## 2023-07-17 MED ORDER — ORAL CARE MOUTH RINSE: 15.0000 mL | Freq: Once | OROMUCOSAL | Status: AC

## 2023-07-17 MED ORDER — PENTAFLUOROPROP-TETRAFLUOROETH EX AERO
INHALATION_SPRAY | CUTANEOUS | Status: AC
  Filled 2023-07-17: qty 30

## 2023-07-17 MED ORDER — CHLORHEXIDINE GLUCONATE 0.12 % MT SOLN
OROMUCOSAL | Status: AC
  Filled 2023-07-17: qty 15

## 2023-07-17 MED ORDER — CHLORHEXIDINE GLUCONATE 0.12 % MT SOLN
15.0000 mL | Freq: Once | OROMUCOSAL | Status: AC
  Administered 2023-07-17: 15 mL via OROMUCOSAL

## 2023-07-17 MED ORDER — PROPOFOL 10 MG/ML IV BOLUS
INTRAVENOUS | Status: AC
  Filled 2023-07-17: qty 20

## 2023-07-17 MED ORDER — ACETAMINOPHEN 500 MG PO TABS
1000.0000 mg | ORAL_TABLET | ORAL | Status: AC
  Administered 2023-07-17: 1000 mg via ORAL

## 2023-07-17 MED ORDER — CEFAZOLIN SODIUM-DEXTROSE 2-4 GM/100ML-% IV SOLN
2.0000 g | Freq: Once | INTRAVENOUS | Status: AC
  Administered 2023-07-17: 2 g via INTRAVENOUS

## 2023-07-17 MED ORDER — FAMOTIDINE 20 MG PO TABS
ORAL_TABLET | ORAL | Status: AC
  Filled 2023-07-17: qty 1

## 2023-07-17 MED ORDER — CHLORHEXIDINE GLUCONATE CLOTH 2 % EX PADS: 6.0000 | MEDICATED_PAD | Freq: Once | CUTANEOUS | Status: DC

## 2023-07-17 MED ORDER — DEXMEDETOMIDINE HCL IN NACL 80 MCG/20ML IV SOLN
INTRAVENOUS | Status: DC | PRN
Start: 2023-07-17 — End: 2023-07-17
  Administered 2023-07-17 (×2): 4 ug via INTRAVENOUS

## 2023-07-17 MED ORDER — PROPOFOL 10 MG/ML IV BOLUS
INTRAVENOUS | Status: DC | PRN
  Administered 2023-07-17: 25 ug/kg/min via INTRAVENOUS
  Administered 2023-07-17: 160 mg via INTRAVENOUS

## 2023-07-17 MED ORDER — ONDANSETRON HCL 4 MG/2ML IJ SOLN
INTRAMUSCULAR | Status: AC
  Filled 2023-07-17: qty 2

## 2023-07-17 MED ORDER — LIDOCAINE HCL (CARDIAC) PF 100 MG/5ML IV SOSY
PREFILLED_SYRINGE | INTRAVENOUS | Status: DC | PRN
  Administered 2023-07-17: 100 mg via INTRAVENOUS

## 2023-07-17 MED ORDER — FAMOTIDINE 20 MG PO TABS
20.0000 mg | ORAL_TABLET | Freq: Once | ORAL | Status: AC
  Administered 2023-07-17: 20 mg via ORAL

## 2023-07-17 MED ORDER — DEXMEDETOMIDINE HCL IN NACL 80 MCG/20ML IV SOLN
INTRAVENOUS | Status: AC
  Filled 2023-07-17: qty 20

## 2023-07-17 MED ORDER — GABAPENTIN 300 MG PO CAPS
300.0000 mg | ORAL_CAPSULE | ORAL | Status: AC
  Administered 2023-07-17: 300 mg via ORAL

## 2023-07-17 SURGICAL SUPPLY — 46 items
ADH SKN CLS APL DERMABOND .7 (GAUZE/BANDAGES/DRESSINGS) ×2
BAG PRESSURE INF REUSE 3000 (BAG) IMPLANT
CLIP LIGATING HEM O LOK PURPLE (MISCELLANEOUS) ×2 IMPLANT
COVER TIP SHEARS 8 DVNC (MISCELLANEOUS) ×2 IMPLANT
DERMABOND ADVANCED .7 DNX12 (GAUZE/BANDAGES/DRESSINGS) ×2 IMPLANT
DRAPE ARM DVNC X/XI (DISPOSABLE) ×8 IMPLANT
DRAPE COLUMN DVNC XI (DISPOSABLE) ×2 IMPLANT
ELECT CAUTERY BLADE 6.4 (BLADE) ×2 IMPLANT
FORCEPS BPLR R/ABLATION 8 DVNC (INSTRUMENTS) ×2 IMPLANT
FORCEPS PROGRASP DVNC XI (FORCEP) ×2 IMPLANT
GLOVE ORTHO TXT STRL SZ7.5 (GLOVE) ×4 IMPLANT
GOWN STRL REUS W/ TWL LRG LVL3 (GOWN DISPOSABLE) ×4 IMPLANT
GOWN STRL REUS W/ TWL XL LVL3 (GOWN DISPOSABLE) ×4 IMPLANT
GOWN STRL REUS W/TWL LRG LVL3 (GOWN DISPOSABLE) ×4
GOWN STRL REUS W/TWL XL LVL3 (GOWN DISPOSABLE) ×4
GRASPER SUT TROCAR 14GX15 (MISCELLANEOUS) ×2 IMPLANT
IRRIGATION STRYKERFLOW (MISCELLANEOUS) IMPLANT
IRRIGATOR STRYKERFLOW (MISCELLANEOUS)
IRRIGATOR SUCT 8 DISP DVNC XI (IRRIGATION / IRRIGATOR) IMPLANT
IV NS IRRIG 3000ML ARTHROMATIC (IV SOLUTION) IMPLANT
KIT PINK PAD W/HEAD ARE REST (MISCELLANEOUS) ×2
KIT PINK PAD W/HEAD ARM REST (MISCELLANEOUS) ×2 IMPLANT
KIT TURNOVER KIT A (KITS) ×2 IMPLANT
LABEL OR SOLS (LABEL) ×2 IMPLANT
MANIFOLD NEPTUNE II (INSTRUMENTS) ×2 IMPLANT
NDL HYPO 22X1.5 SAFETY MO (MISCELLANEOUS) ×2 IMPLANT
NDL INSUFFLATION 14GA 120MM (NEEDLE) ×2 IMPLANT
NEEDLE HYPO 22X1.5 SAFETY MO (MISCELLANEOUS) ×2 IMPLANT
NEEDLE INSUFFLATION 14GA 120MM (NEEDLE) ×2 IMPLANT
NS IRRIG 500ML POUR BTL (IV SOLUTION) ×2 IMPLANT
PACK LAP CHOLECYSTECTOMY (MISCELLANEOUS) ×2 IMPLANT
SCISSORS MNPLR CVD DVNC XI (INSTRUMENTS) ×2 IMPLANT
SEAL UNIV 5-12 XI (MISCELLANEOUS) ×8 IMPLANT
SET TUBE SMOKE EVAC HIGH FLOW (TUBING) ×2 IMPLANT
SOL ELECTROSURG ANTI STICK (MISCELLANEOUS) ×2
SOLUTION ELECTROSURG ANTI STCK (MISCELLANEOUS) ×2 IMPLANT
SPIKE FLUID TRANSFER (MISCELLANEOUS) ×2 IMPLANT
SUT MNCRL 4-0 (SUTURE) ×2
SUT MNCRL 4-0 27XMFL (SUTURE) ×2
SUT VICRYL 0 UR6 27IN ABS (SUTURE) ×2 IMPLANT
SUTURE MNCRL 4-0 27XMF (SUTURE) ×2 IMPLANT
SYS BAG RETRIEVAL 10MM (BASKET) ×2
SYSTEM BAG RETRIEVAL 10MM (BASKET) ×2 IMPLANT
TRAP FLUID SMOKE EVACUATOR (MISCELLANEOUS) ×2 IMPLANT
TROCAR Z-THREAD FIOS 11X100 BL (TROCAR) ×2 IMPLANT
WATER STERILE IRR 500ML POUR (IV SOLUTION) ×2 IMPLANT

## 2023-07-17 NOTE — Interval H&P Note (Signed)
History and Physical Interval Note:  07/17/2023 12:51 PM  Briana Lyons  has presented today for surgery, with the diagnosis of chronic calculous cholecystitis.  The various methods of treatment have been discussed with the patient and family. After consideration of risks, benefits and other options for treatment, the patient has consented to  Procedure(s): XI ROBOTIC ASSISTED LAPAROSCOPIC CHOLECYSTECTOMY (N/A) INDOCYANINE GREEN FLUORESCENCE IMAGING (ICG) (N/A) as a surgical intervention.  The patient's history has been reviewed, patient examined, no change in status, stable for surgery.  I have reviewed the patient's chart and labs.  Questions were answered to the patient's satisfaction.     Campbell Lerner

## 2023-07-17 NOTE — Anesthesia Preprocedure Evaluation (Signed)
Anesthesia Evaluation  Patient identified by MRN, date of birth, ID band Patient awake    Reviewed: Allergy & Precautions, NPO status , Patient's Chart, lab work & pertinent test results  History of Anesthesia Complications Negative for: history of anesthetic complications  Airway Mallampati: II  TM Distance: >3 FB Neck ROM: Full    Dental  (+) Edentulous Lower, Upper Dentures   Pulmonary neg sleep apnea, neg COPD, Current Smoker and Patient abstained from smoking.   Pulmonary exam normal breath sounds clear to auscultation       Cardiovascular Exercise Tolerance: Good METS(-) hypertension(-) CAD and (-) Past MI negative cardio ROS (-) dysrhythmias  Rhythm:Regular Rate:Normal - Systolic murmurs    Neuro/Psych  PSYCHIATRIC DISORDERS  Depression Bipolar Disorder   negative neurological ROS     GI/Hepatic ,GERD  Controlled,,(+)     substance abuse  marijuana usePrior positive in 2021 for cocaine and amphetamines. Denies current drug use, does not appear acutely impaired   Endo/Other  neg diabetes    Renal/GU negative Renal ROS     Musculoskeletal   Abdominal   Peds  Hematology   Anesthesia Other Findings Past Medical History: No date: Bipolar 1 disorder (HCC) No date: Depression No date: GERD (gastroesophageal reflux disease) No date: Herpes No date: Ovarian cyst  Reproductive/Obstetrics                             Anesthesia Physical Anesthesia Plan  ASA: 2  Anesthesia Plan: General   Post-op Pain Management: Tylenol PO (pre-op)*, Celebrex PO (pre-op)* and Gabapentin PO (pre-op)*   Induction: Intravenous  PONV Risk Score and Plan: 3 and Ondansetron, Dexamethasone and Midazolam  Airway Management Planned: Oral ETT and Video Laryngoscope Planned  Additional Equipment: None  Intra-op Plan:   Post-operative Plan: Extubation in OR  Informed Consent: I have reviewed the  patients History and Physical, chart, labs and discussed the procedure including the risks, benefits and alternatives for the proposed anesthesia with the patient or authorized representative who has indicated his/her understanding and acceptance.     Dental advisory given  Plan Discussed with: CRNA and Surgeon  Anesthesia Plan Comments: (Discussed risks of anesthesia with patient, including PONV, sore throat, lip/dental/eye damage. Rare risks discussed as well, such as cardiorespiratory and neurological sequelae, and allergic reactions. Discussed the role of CRNA in patient's perioperative care. Patient understands. Patient counseled on benefits of smoking cessation, and increased perioperative risks associated with continued smoking. Patient MRSA positive, will give vancomycin + cefazolin for prophylaxis)       Anesthesia Quick Evaluation

## 2023-07-17 NOTE — Op Note (Signed)
Robotic cholecystectomy with Indocyamine Green Ductal Imaging.   Pre-operative Diagnosis: Chronic calculus cholecystitis  Post-operative Diagnosis:  Same.  Procedure: Robotic assisted laparoscopic cholecystectomy with Indocyamine Green Ductal Imaging.   Surgeon: Campbell Lerner, M.D., FACS  Anesthesia: General. with endotracheal tube  Findings: as expected.   Estimated Blood Loss: 15 mL         Drains: None         Specimens: Gallbladder           Complications: none  Procedure Details  The patient was seen again in the Holding Room.  1.25 mg dose of ICG was administered intravenously.   The benefits, complications, treatment options, risks and expected outcomes were again reviewed with the patient. The likelihood of improving the patient's symptoms with return to their baseline status is good.  The patient and/or family concurred with the proposed plan, giving informed consent, again alternatives reviewed.  The patient was taken to Operating Room, identified, and the procedure verified as robotic assisted laparoscopic cholecystectomy.  Prior to the induction of general anesthesia, antibiotic prophylaxis was administered. VTE prophylaxis was in place. General endotracheal anesthesia was then administered and tolerated well. The patient was positioned in the supine position.  After the induction, the abdomen was prepped with Chloraprep and draped in the sterile fashion.  A Time Out was held and the above information confirmed.  After local infiltration of quarter percent Marcaine with epinephrine, stab incision was made left upper quadrant.  Just below the costal margin at Palmer's point, approximately midclavicular line the Veres needle is passed with sensation of the layers to penetrate the abdominal wall and into the peritoneum.  Saline drop test is confirmed peritoneal placement.  Insufflation is initiated with carbon dioxide to pressures of 15 mmHg.  Local infiltration with a  mixture of Exparel and 0.25% Marcaine with epinephrine is utilized for all skin incisions.  Made a 12 mm incision on the right periumbilical site, I advanced an optical 11mm port under direct visualization into the peritoneal cavity.  Once the peritoneum was penetrated, insufflation was initiated.  The trocar was then advanced into the abdominal cavity under direct visualization. Pneumoperitoneum was then continued utilizing CO2 at 15 mmHg or less and tolerated well without any adverse changes in the patient's vital signs.  Two 8.5-mm ports were placed in the left lower quadrant and laterally, and one to the right lower quadrant, all under direct vision. Local infiltration with a mixture of Exparel and 0.25% Marcaine with epinephrine is utilized for all port sites with deep infiltration under visualization.   The patient was positioned  in reverse Trendelenburg, tilted the patient's left side down.  Da Vinci XI robot was then positioned on to the patient's left side, and docked.  The gallbladder was identified, the fundus grasped via the arm 4 Prograsp and retracted cephalad. Adhesions were lysed with scissors and cautery.  The infundibulum was identified grasped and retracted laterally, exposing the peritoneum overlying the triangle of Calot. This was then opened and dissected using cautery & scissors. An extended critical view of the cystic duct and cystic artery was obtained, aided by the ICG via FireFly which improved localization of the ductal anatomy.    The cystic duct was clearly identified and dissected to isolation.   Artery well isolated and clipped, and the cystic duct was triple clipped and divided with scissors, as close to the gallbladder neck as feasible, thus leaving two on the remaining stump.  The specimen side of the artery  is sealed with bipolar and divided with monopolar scissors.   The gallbladder was taken from the gallbladder fossa in a retrograde fashion with the electrocautery. The  gallbladder was removed and placed in an Endocatch bag.  The liver bed is inspected. Hemostasis was confirmed.  The robot was undocked and moved away from the operative field. No irrigation was utilized. The gallbladder and Endocatch sac were then removed through the paraumbilical port site.   Inspection of the right upper quadrant was performed. No bleeding, bile duct injury or leak, or bowel injury was noted. The infra-umbilical port site fascia was closed with interrumpted 0 Vicryl sutures using PMI/cone under direct visualization. Pneumoperitoneum was released and ports removed.  4-0 subcuticular Monocryl was used to close the skin. Dermabond was  applied.  The patient was then extubated and brought to the recovery room in stable condition. Sponge, lap, and needle counts were correct at closure and at the conclusion of the case.               Campbell Lerner, M.D., [facility] 07/17/2023 2:24 PM

## 2023-07-17 NOTE — Transfer of Care (Signed)
Immediate Anesthesia Transfer of Care Note  Patient: Briana Lyons  Procedure(s) Performed: XI ROBOTIC ASSISTED LAPAROSCOPIC CHOLECYSTECTOMY (Abdomen) INDOCYANINE GREEN FLUORESCENCE IMAGING (ICG)  Patient Location: PACU  Anesthesia Type:General  Level of Consciousness: sedated  Airway & Oxygen Therapy: Patient Spontanous Breathing and Patient connected to face mask oxygen  Post-op Assessment: Report given to RN and Post -op Vital signs reviewed and stable  Post vital signs: Reviewed and stable  Last Vitals:  Vitals Value Taken Time  BP 160/86 07/17/23 1434  Temp    Pulse 64 07/17/23 1439  Resp 18 07/17/23 1439  SpO2 100 % 07/17/23 1439  Vitals shown include unfiled device data.  Last Pain:  Vitals:   07/17/23 1143  TempSrc: Tympanic  PainSc: 4          Complications: No notable events documented.

## 2023-07-17 NOTE — Discharge Instructions (Signed)

## 2023-07-17 NOTE — Anesthesia Postprocedure Evaluation (Signed)
Anesthesia Post Note  Patient: Briana Lyons  Procedure(s) Performed: XI ROBOTIC ASSISTED LAPAROSCOPIC CHOLECYSTECTOMY (Abdomen) INDOCYANINE GREEN FLUORESCENCE IMAGING (ICG)  Patient location during evaluation: PACU Anesthesia Type: General Level of consciousness: awake and alert Pain management: pain level controlled Vital Signs Assessment: post-procedure vital signs reviewed and stable Respiratory status: spontaneous breathing, nonlabored ventilation, respiratory function stable and patient connected to nasal cannula oxygen Cardiovascular status: blood pressure returned to baseline and stable Postop Assessment: no apparent nausea or vomiting Anesthetic complications: no   No notable events documented.   Last Vitals:  Vitals:   07/17/23 1445 07/17/23 1500  BP: 126/73 (!) 143/116  Pulse: 75 68  Resp: 14 17  Temp:    SpO2: 98% 99%    Last Pain:  Vitals:   07/17/23 1500  TempSrc:   PainSc: 5                  Corinda Gubler

## 2023-07-17 NOTE — Anesthesia Procedure Notes (Signed)
Procedure Name: Intubation Date/Time: 07/17/2023 1:34 PM  Performed by: Elisabeth Pigeon, CRNAPre-anesthesia Checklist: Patient identified, Patient being monitored, Timeout performed, Emergency Drugs available and Suction available Patient Re-evaluated:Patient Re-evaluated prior to induction Oxygen Delivery Method: Circle system utilized Preoxygenation: Pre-oxygenation with 100% oxygen Induction Type: IV induction Ventilation: Mask ventilation without difficulty Laryngoscope Size: Mac, 3 and McGraph Grade View: Grade I Tube type: Oral Tube size: 6.5 mm Number of attempts: 1 Airway Equipment and Method: Stylet Placement Confirmation: ETT inserted through vocal cords under direct vision, positive ETCO2 and breath sounds checked- equal and bilateral Secured at: 20 cm Tube secured with: Tape Dental Injury: Teeth and Oropharynx as per pre-operative assessment

## 2023-07-18 LAB — SURGICAL PATHOLOGY

## 2023-07-24 ENCOUNTER — Encounter: Payer: Self-pay | Admitting: Surgery

## 2023-07-26 ENCOUNTER — Ambulatory Visit: Payer: Medicaid Other | Admitting: Nurse Practitioner

## 2023-08-02 ENCOUNTER — Encounter: Payer: Medicaid Other | Admitting: Physician Assistant

## 2023-08-18 ENCOUNTER — Ambulatory Visit: Payer: Self-pay

## 2023-08-18 ENCOUNTER — Ambulatory Visit: Payer: Medicaid Other | Admitting: Physician Assistant

## 2023-08-18 ENCOUNTER — Other Ambulatory Visit (HOSPITAL_COMMUNITY)
Admission: RE | Admit: 2023-08-18 | Discharge: 2023-08-18 | Disposition: A | Discharge status: Home / Self Care | Payer: Medicaid Other | Source: Ambulatory Visit | Attending: Physician Assistant | Admitting: Physician Assistant

## 2023-08-18 ENCOUNTER — Encounter: Payer: Self-pay | Admitting: Physician Assistant

## 2023-08-18 VITALS — BP 128/84 | HR 115 | Temp 97.9°F | Resp 16 | Ht 63.0 in | Wt 193.8 lb

## 2023-08-18 DIAGNOSIS — R300 Dysuria: Secondary | ICD-10-CM

## 2023-08-18 DIAGNOSIS — N760 Acute vaginitis: Secondary | ICD-10-CM

## 2023-08-18 DIAGNOSIS — Z202 Contact with and (suspected) exposure to infections with a predominantly sexual mode of transmission: Secondary | ICD-10-CM

## 2023-08-18 DIAGNOSIS — B3731 Acute candidiasis of vulva and vagina: Secondary | ICD-10-CM | POA: Diagnosis not present

## 2023-08-18 DIAGNOSIS — B9689 Other specified bacterial agents as the cause of diseases classified elsewhere: Secondary | ICD-10-CM | POA: Diagnosis not present

## 2023-08-18 DIAGNOSIS — R102 Pelvic and perineal pain: Secondary | ICD-10-CM | POA: Diagnosis not present

## 2023-08-18 LAB — POCT URINALYSIS DIPSTICK
Bilirubin, UA: NEGATIVE
Glucose, UA: NEGATIVE
Ketones, UA: NEGATIVE
Nitrite, UA: NEGATIVE
Protein, UA: NEGATIVE
Spec Grav, UA: 1.02 (ref 1.010–1.025)
Urobilinogen, UA: 0.2 U/dL
pH, UA: 5 (ref 5.0–8.0)

## 2023-08-18 MED ORDER — AZITHROMYCIN 500 MG PO TABS: 1000.0000 mg | ORAL_TABLET | Freq: Every day | ORAL | 0 refills | Status: AC

## 2023-08-18 MED ORDER — LIDOCAINE HCL (PF) 1 % IJ SOLN
2.0000 mL | Freq: Once | INTRAMUSCULAR | Status: AC
  Administered 2023-08-18: 2 mL via INTRADERMAL

## 2023-08-18 MED ORDER — CEFTRIAXONE SODIUM 500 MG IJ SOLR
500.0000 mg | Freq: Once | INTRAMUSCULAR | Status: AC
  Administered 2023-08-18: 500 mg via INTRAMUSCULAR

## 2023-08-18 NOTE — Progress Notes (Signed)
Acute Office Visit   Patient: Briana Lyons   DOB: 1981-04-19   42 y.o. Female  MRN: [id number] Visit Date: 08/18/2023  Today's healthcare provider: Oswaldo Conroy Ahkeem Goede, PA-C  Introduced myself to the patient as a Secondary school teacher and provided education on APPs in clinical practice.    Chief Complaint  Patient presents with   Vaginal Itching    vaginal itching, burning, swelling, pain, yellowish/grey discharge   Subjective    HPI HPI     Vaginal Itching    Additional comments: vaginal itching, burning, swelling, pain, yellowish/grey discharge      Last edited by Forde Radon, CMA on 08/18/2023  3:44 PM.      Concern for vaginal discharge and pain  Onset: sudden  Duration: started about 4 days ago  Associated symptoms: she reports vaginal itching, itching and changes to vaginal discharge, concern for vaginal odor, pain with wiping, suprapubic pain,  Concerns for STD exposure:yes she reports her most recent partner has been sexually active with others and she thinks he was treated for STD Interventions: she has tried OTC yeast cream    Medications: Outpatient Medications Prior to Visit  Medication Sig   HYDROcodone-acetaminophen (NORCO/VICODIN) 5-325 MG tablet Take 1 tablet by mouth every 6 (six) hours as needed for moderate pain. (Patient not taking: Reported on 08/18/2023)   ibuprofen (ADVIL) 800 MG tablet Take 1 tablet (800 mg total) by mouth every 8 (eight) hours as needed. (Patient not taking: Reported on 08/18/2023)   mupirocin cream (BACTROBAN) 2 % Apply 1 Application topically 2 (two) times daily. Apply twice a day to the inside of both nostrils (Patient not taking: Reported on 08/18/2023)   No facility-administered medications prior to visit.    Review of Systems  Constitutional:  Positive for chills. Negative for fever.  Gastrointestinal:  Positive for abdominal pain.  Genitourinary:  Positive for dysuria, pelvic pain, vaginal discharge and vaginal pain.  Negative for flank pain and frequency.       Vaginal itching         Objective    BP 128/84   Pulse (!) 115   Temp 97.9 F (36.6 C) (Oral)   Resp 16   Ht 5\' 3"  (1.6 m)   Wt 193 lb 12.8 oz (87.9 kg)   SpO2 98%   BMI 34.33 kg/m     Physical Exam Vitals reviewed.  Constitutional:      General: She is awake.     Appearance: Normal appearance. She is well-developed and well-groomed.  HENT:     Head: Normocephalic and atraumatic.  Pulmonary:     Effort: Pulmonary effort is normal.  Musculoskeletal:     Cervical back: Normal range of motion.  Skin:    General: Skin is warm and dry.  Neurological:     General: No focal deficit present.     Mental Status: She is alert and oriented to person, place, and time.     GCS: GCS eye subscore is 4. GCS verbal subscore is 5. GCS motor subscore is 6.  Psychiatric:        Attention and Perception: Attention and perception normal.        Mood and Affect: Mood and affect normal.        Speech: Speech normal.        Behavior: Behavior normal. Behavior is cooperative.       Results for orders placed or performed in visit on 08/18/23  POCT  urinalysis dipstick  Result Value Ref Range   Color, UA Yellow    Clarity, UA Cloudy    Glucose, UA Negative Negative   Bilirubin, UA Negative    Ketones, UA Negative    Spec Grav, UA 1.020 1.010 - 1.025   Blood, UA Moderate    pH, UA 5.0 5.0 - 8.0   Protein, UA Negative Negative   Urobilinogen, UA 0.2 0.2 or 1.0 E.U./dL   Nitrite, UA Negative    Leukocytes, UA Moderate (2+) (A) Negative   Appearance Yellow    Odor FOUL     Assessment & Plan      No follow-ups on file.      Problem List Items Addressed This Visit   None Visit Diagnoses     Vaginal pain    -  Primary   Relevant Orders   Cervicovaginal ancillary only   Urine Culture   POCT urinalysis dipstick (Completed)   Exposure to chlamydia       Relevant Medications   azithromycin (ZITHROMAX) 500 MG tablet   Exposure to  gonorrhea       Dysuria          Acute, new concern Patient reports pain with urination along with vaginal discomfort, vaginal itching, changes to vaginal discharge. She reports that her symptoms feel similar to when she has been diagnosed with previous STD. She reports that she has likely exposure to STD through her recent partner.  She reports that she believes he has been recently treated for an STD but she is not sure which one. Will treat empirically for gonorrhea and chlamydia with Rocephin 500 mg injection and provide prescription for azithromycin 1 g p.o. once ED and return precautions reviewed and provided in after visit summary UA was concerning for UTI but given current vaginal symptoms I am unsure if this is potential contamination.  Rocephin should provide adequate coverage over the weekend until urine culture returns. Cervicovaginal swab and urine culture results to dictate further management.  Reviewed urine dip results with her during appointment.  Also reviewed that it will be several days before cervicovaginal swab results and culture results are available. Follow-up as needed for progressing or persistent symptoms  No follow-ups on file.   I, [place] Merida E Matis Monnier, PA-C, have reviewed all documentation for this visit. The documentation on 08/18/23 for the exam, diagnosis, procedures, and orders are all accurate and complete.   Jacquelin Hawking, MHS, PA-C [facility]

## 2023-08-18 NOTE — Telephone Encounter (Signed)
Chief Complaint: Vaginal symptoms  Symptoms: vaginal itching, burning, swelling, pain, discharge Frequency: constant  Pertinent Negatives: Patient denies fever, blood in urine Disposition: [] ED /[] Urgent Care (no appt availability in office) / [x] Appointment(In office/virtual)/ []  [facility] [place] [facility]/ [] Home Care/ [] Refused Recommended Disposition /[] [facility]/ []  Follow-up with PCP Additional Notes: Patient states she is experiencing severe vaginal itching, burning, pain in the inside of the vagina, swelling and discharge that is yellowish in color. Patient states she was sexually active with someone who may have an STI, her symptoms started about 4 days ago and thought it was a yeast infection. Patient tried over the counter cream to treat yeast but she stated the symptoms got worse so she stopped using it.  Patient is concerned she may have an STI. Care advice was given and patient has been scheduled to be seen today at [facility] due to no appointments available in the office today.  Summary: possible STD   Patient called stated she is experiencing burning, itching and swelling in the vaginal area. She stated she was sexually active with someone that had an STD and she doesn't know what she has. No appts before Oct 29t. Please f/u with patient     Reason for Disposition  MODERATE-SEVERE itching (i.e., interferes with school, work, or sleep)  Answer Assessment - Initial Assessment Questions 1. SYMPTOM: "What's the main symptom you're concerned about?" (e.g., pain, itching, dryness)     Pain 2. LOCATION: "Where is the  Pain located?" (e.g., inside/outside, left/right)     Inside of the vagina  3. ONSET: "When did the  pain  start?"     4 days ago  4. PAIN: "Is there any pain?" If Yes, ask: "How bad is it?" (Scale: 1-10; mild, moderate, severe)   -  MILD (1-3): Doesn't interfere with normal activities.    -  MODERATE (4-7): Interferes with normal activities (e.g., work or  school) or awakens from sleep.     -  SEVERE (8-10): Excruciating pain, unable to do any normal activities.     8/10 5. ITCHING: "Is there any itching?" If Yes, ask: "How bad is it?" (Scale: 1-10; mild, moderate, severe)     severe 6. CAUSE: "What do you think is causing the discharge?" "Have you had the same problem before? What happened then?"     I think I have a STI, the discharge is yellowish grey  7. OTHER SYMPTOMS: "Do you have any other symptoms?" (e.g., fever, itching, vaginal bleeding, pain with urination, injury to genital area, vaginal foreign body)     Burning, swelling in the vaginal area, vomiting  Protocols used: Vaginal Symptoms-A-AH

## 2023-08-18 NOTE — Patient Instructions (Signed)
We treated you today prophylactically for gonorrhea and chlamydia.  We administered 500 mg of Rocephin and you have a prescription for azithromycin 1 g to pick up at your preferred pharmacy.  It is very important that you take the azithromycin when you pick it up. We will keep you updated on the results of your urine culture once they are available.  We will let you know if further antibiotics are needed to treat this.  If your cervicovaginal swab comes back positive for a reportable STD we will have to report this to the health department and they will likely contact you for further source tracing as well as to make sure that any potentially exposed partners are getting treated. If you start to experience pelvic pain, fevers, chills, nausea, vomiting that is not improving after the medications are administered please seek prompt emergency care. Please make sure you are staying well-hydrated and I do not recommend sexual intercourse with any individuals over the next 7 days.  If your testing is positive I recommend that you return to the office for a test of cure. Please let us know if you have further questions or concerns

## 2023-08-20 LAB — URINE CULTURE
MICRO NUMBER:: 15645643
SPECIMEN QUALITY:: ADEQUATE

## 2023-08-22 LAB — CERVICOVAGINAL ANCILLARY ONLY
Bacterial Vaginitis (gardnerella): POSITIVE — AB
Candida Glabrata: POSITIVE — AB
Candida Vaginitis: NEGATIVE
Chlamydia: NEGATIVE
Comment: NEGATIVE
Comment: NEGATIVE
Comment: NEGATIVE
Comment: NEGATIVE
Comment: NEGATIVE
Comment: NORMAL
Neisseria Gonorrhea: NEGATIVE
Trichomonas: NEGATIVE

## 2023-08-22 MED ORDER — METRONIDAZOLE 500 MG PO TABS
500.0000 mg | ORAL_TABLET | Freq: Two times a day (BID) | ORAL | 0 refills | Status: DC
Start: 2023-08-22 — End: 2023-09-04

## 2023-08-22 MED ORDER — FLUCONAZOLE 150 MG PO TABS
150.0000 mg | ORAL_TABLET | ORAL | 0 refills | Status: DC | PRN
Start: 2023-08-22 — End: 2023-09-04

## 2023-08-22 NOTE — Progress Notes (Signed)
Your testing results are back Your urine did show evidence of a UTI caused by a Streptococcus bacteria.  This bacteria is susceptible to the injection of Rocephin that we provided at your appointment.  If you are still having symptoms please let us know Your cervicovaginal swab was positive for bacterial vaginosis.  I have sent in a script for Flagyl to be taken by mouth twice per day for 7 days. Please finish the entire course unless you are instructed to stop or develop an allergic reaction. Please note that this medication can cause severe nausea and vomiting if alcohol is consumed while you are taking it so please refrain from this during the week you are on it.  Your cervicovaginal swab was also positive for yeast. I have sent in a prescription for Diflucan for you to take to assist with resolving this.  Please follow the directions included in your prescription Please let us know if you have further questions or concerns.

## 2023-08-22 NOTE — Addendum Note (Signed)
Addended by: Jacquelin Hawking on: 08/22/2023 03:58 PM   Modules accepted: Orders

## 2023-09-01 ENCOUNTER — Other Ambulatory Visit: Payer: Self-pay

## 2023-09-01 DIAGNOSIS — Z9049 Acquired absence of other specified parts of digestive tract: Secondary | ICD-10-CM

## 2023-09-01 DIAGNOSIS — K859 Acute pancreatitis without necrosis or infection, unspecified: Principal | ICD-10-CM | POA: Diagnosis present

## 2023-09-01 DIAGNOSIS — K2970 Gastritis, unspecified, without bleeding: Secondary | ICD-10-CM | POA: Diagnosis present

## 2023-09-01 DIAGNOSIS — F17210 Nicotine dependence, cigarettes, uncomplicated: Secondary | ICD-10-CM | POA: Diagnosis present

## 2023-09-01 DIAGNOSIS — K8020 Calculus of gallbladder without cholecystitis without obstruction: Secondary | ICD-10-CM | POA: Diagnosis present

## 2023-09-01 DIAGNOSIS — M51369 Other intervertebral disc degeneration, lumbar region without mention of lumbar back pain or lower extremity pain: Secondary | ICD-10-CM | POA: Diagnosis present

## 2023-09-01 DIAGNOSIS — F319 Bipolar disorder, unspecified: Secondary | ICD-10-CM | POA: Diagnosis present

## 2023-09-01 DIAGNOSIS — M51379 Other intervertebral disc degeneration, lumbosacral region without mention of lumbar back pain or lower extremity pain: Secondary | ICD-10-CM | POA: Diagnosis present

## 2023-09-01 DIAGNOSIS — E669 Obesity, unspecified: Secondary | ICD-10-CM | POA: Diagnosis present

## 2023-09-01 DIAGNOSIS — Z885 Allergy status to narcotic agent status: Secondary | ICD-10-CM

## 2023-09-01 DIAGNOSIS — T39391A Poisoning by other nonsteroidal anti-inflammatory drugs [NSAID], accidental (unintentional), initial encounter: Secondary | ICD-10-CM | POA: Diagnosis present

## 2023-09-01 DIAGNOSIS — Z6834 Body mass index (BMI) 34.0-34.9, adult: Secondary | ICD-10-CM

## 2023-09-01 DIAGNOSIS — K219 Gastro-esophageal reflux disease without esophagitis: Secondary | ICD-10-CM | POA: Diagnosis present

## 2023-09-01 LAB — CBC
HCT: 39.9 % (ref 36.0–46.0)
Hemoglobin: 13.2 g/dL (ref 12.0–15.0)
MCH: 29.1 pg (ref 26.0–34.0)
MCHC: 33.1 g/dL (ref 30.0–36.0)
MCV: 88.1 fL (ref 80.0–100.0)
Platelets: 453 10*3/uL — ABNORMAL HIGH (ref 150–400)
RBC: 4.53 MIL/uL (ref 3.87–5.11)
RDW: 13.8 % (ref 11.5–15.5)
WBC: 11.4 10*3/uL — ABNORMAL HIGH (ref 4.0–10.5)
nRBC: 0 % (ref 0.0–0.2)

## 2023-09-01 LAB — COMPREHENSIVE METABOLIC PANEL
ALT: 18 U/L (ref 0–44)
AST: 16 U/L (ref 15–41)
Albumin: 3.9 g/dL (ref 3.5–5.0)
Alkaline Phosphatase: 76 U/L (ref 38–126)
Anion gap: 9 (ref 5–15)
BUN: 7 mg/dL (ref 6–20)
CO2: 25 mmol/L (ref 22–32)
Calcium: 8.8 mg/dL — ABNORMAL LOW (ref 8.9–10.3)
Chloride: 104 mmol/L (ref 98–111)
Creatinine, Ser: 0.74 mg/dL (ref 0.44–1.00)
GFR, Estimated: >60 mL/min (ref >60)
Glucose, Bld: 92 mg/dL (ref 70–99)
Potassium: 3.6 mmol/L (ref 3.5–5.1)
Sodium: 138 mmol/L (ref 135–145)
Total Bilirubin: 0.3 mg/dL (ref <1.2)
Total Protein: 7.1 g/dL (ref 6.5–8.1)

## 2023-09-01 LAB — LIPASE, BLOOD: Lipase: 1272 U/L — ABNORMAL HIGH (ref 11–51)

## 2023-09-01 NOTE — ED Triage Notes (Signed)
Pt sts that she is having abd pain with side pain with lower back pain.

## 2023-09-02 ENCOUNTER — Emergency Department: Payer: Medicaid Other

## 2023-09-02 ENCOUNTER — Inpatient Hospital Stay
Admission: EM | Admit: 2023-09-02 | Discharge: 2023-09-04 | DRG: 440 | Disposition: A | Discharge status: Home / Self Care | Payer: Medicaid Other | Attending: Internal Medicine | Admitting: Internal Medicine

## 2023-09-02 DIAGNOSIS — K2970 Gastritis, unspecified, without bleeding: Secondary | ICD-10-CM | POA: Diagnosis not present

## 2023-09-02 DIAGNOSIS — K859 Acute pancreatitis without necrosis or infection, unspecified: Principal | ICD-10-CM

## 2023-09-02 DIAGNOSIS — R109 Unspecified abdominal pain: Secondary | ICD-10-CM | POA: Diagnosis not present

## 2023-09-02 DIAGNOSIS — K219 Gastro-esophageal reflux disease without esophagitis: Secondary | ICD-10-CM | POA: Diagnosis present

## 2023-09-02 DIAGNOSIS — F17200 Nicotine dependence, unspecified, uncomplicated: Secondary | ICD-10-CM | POA: Diagnosis present

## 2023-09-02 DIAGNOSIS — E669 Obesity, unspecified: Secondary | ICD-10-CM | POA: Insufficient documentation

## 2023-09-02 DIAGNOSIS — K8020 Calculus of gallbladder without cholecystitis without obstruction: Secondary | ICD-10-CM | POA: Diagnosis present

## 2023-09-02 HISTORY — DX: Acute pancreatitis without necrosis or infection, unspecified: K85.90

## 2023-09-02 LAB — URINALYSIS, ROUTINE W REFLEX MICROSCOPIC
Bilirubin Urine: NEGATIVE
Glucose, UA: NEGATIVE mg/dL
Ketones, ur: NEGATIVE mg/dL
Nitrite: NEGATIVE
Protein, ur: NEGATIVE mg/dL
Specific Gravity, Urine: 1.015 (ref 1.005–1.030)
pH: 5 (ref 5.0–8.0)

## 2023-09-02 LAB — TRIGLYCERIDES: Triglycerides: 103 mg/dL (ref <150)

## 2023-09-02 LAB — POC URINE PREG, ED: Preg Test, Ur: NEGATIVE

## 2023-09-02 MED ORDER — ONDANSETRON HCL 4 MG/2ML IJ SOLN
4.0000 mg | Freq: Once | INTRAMUSCULAR | Status: AC
  Administered 2023-09-02: 4 mg via INTRAVENOUS
  Filled 2023-09-02: qty 2

## 2023-09-02 MED ORDER — IOHEXOL 300 MG/ML  SOLN
100.0000 mL | Freq: Once | INTRAMUSCULAR | Status: AC | PRN
  Administered 2023-09-02: 100 mL via INTRAVENOUS

## 2023-09-02 MED ORDER — PANTOPRAZOLE SODIUM 40 MG IV SOLR
40.0000 mg | Freq: Two times a day (BID) | INTRAVENOUS | Status: DC
  Administered 2023-09-02 – 2023-09-04 (×5): 40 mg via INTRAVENOUS
  Filled 2023-09-02 (×5): qty 10

## 2023-09-02 MED ORDER — HYDROMORPHONE HCL 1 MG/ML IJ SOLN
0.5000 mg | INTRAMUSCULAR | Status: DC | PRN
  Administered 2023-09-02 – 2023-09-04 (×11): 0.5 mg via INTRAVENOUS
  Filled 2023-09-02 (×11): qty 0.5

## 2023-09-02 MED ORDER — HYDROMORPHONE HCL 1 MG/ML IJ SOLN
0.5000 mg | Freq: Once | INTRAMUSCULAR | Status: AC
  Administered 2023-09-02: 0.5 mg via INTRAVENOUS
  Filled 2023-09-02: qty 0.5

## 2023-09-02 MED ORDER — NICOTINE 21 MG/24HR TD PT24
21.0000 mg | MEDICATED_PATCH | Freq: Every day | TRANSDERMAL | Status: DC
  Filled 2023-09-02 (×3): qty 1

## 2023-09-02 MED ORDER — SODIUM CHLORIDE 0.9 % IV BOLUS
1000.0000 mL | Freq: Once | INTRAVENOUS | Status: AC
  Administered 2023-09-02: 1000 mL via INTRAVENOUS

## 2023-09-02 NOTE — H&P (Addendum)
History and Physical    Patient: Briana Lyons QQV:[id number] DOB: 11/14/1980 DOA: 09/02/2023 DOS: the patient was seen and examined on 09/02/2023 PCP: Larae Grooms, NP  Patient coming from: Home  Chief Complaint:  Chief Complaint  Patient presents with   Abdominal Pain   HPI: Briana Lyons is a 42 y.o. female with medical history significant of obesity, depression, GERD presenting with pancreatitis, gastritis.  Patient reports worsening left lower quadrant periumbilical as well as upper abdominal pain for the past 1 to 2 days.  Symptoms have progressively worsened over this timeframe.  No fevers or chills.  Positive nausea.  No chest pain or shortness of breath.  Noted had gallbladder removal September of this year.  No diarrhea.  Patient does report taking regular high-dose NSAIDs including Goody powders and ibuprofen for generalized pain.  Also does admit to eating a high fat diet.  Denies any alcohol use.  Currently smoking 1/2 to 1 pack/day.  Denies any prior episodes like this past.  No reported abdominal trauma.  No focal hemiparesis or confusion.  No reported dysuria. Presented to the ER afebrile, hemodynamically stable.  Satting well on room air.  White count 11.4, hemoglobin 13.2, platelets 453, creatinine 0.74, LFTs within normal limits.  Lipase 1272.  Urinalysis mildly indicative of infection.  U preg negative.  CT abdomen/pelvis grossly within normal limits. Review of Systems: As mentioned in the history of present illness. All other systems reviewed and are negative. Past Medical History:  Diagnosis Date   Bipolar 1 disorder (HCC)    Depression    GERD (gastroesophageal reflux disease)    Herpes    Ovarian cyst    Past Surgical History:  Procedure Laterality Date   APPENDECTOMY     CESAREAN SECTION     TUBAL LIGATION     Social History:  reports that she has been smoking cigarettes. She has a 12 pack-year smoking history. She has been exposed to tobacco smoke. She has  never used smokeless tobacco. She reports current alcohol use. She reports that she does not currently use drugs after having used the following drugs: Marijuana.  Allergies  Allergen Reactions   Codeine Nausea And Vomiting   Tylenol With Codeine #3 [Acetaminophen-Codeine]     No family history on file.  Prior to Admission medications   Medication Sig Start Date End Date Taking? Authorizing Provider  fluconazole (DIFLUCAN) 150 MG tablet Take 1 tablet (150 mg total) by mouth every three (3) days as needed. May repeat in 3 days if symptoms not resolved 08/22/23   Mecum, Erin E, PA-C  HYDROcodone-acetaminophen (NORCO/VICODIN) 5-325 MG tablet Take 1 tablet by mouth every 6 (six) hours as needed for moderate pain. Patient not taking: Reported on 08/18/2023 07/17/23   Campbell Lerner, MD  ibuprofen (ADVIL) 800 MG tablet Take 1 tablet (800 mg total) by mouth every 8 (eight) hours as needed. Patient not taking: Reported on 08/18/2023 07/17/23   Campbell Lerner, MD  metroNIDAZOLE (FLAGYL) 500 MG tablet Take 1 tablet (500 mg total) by mouth 2 (two) times daily. 08/22/23   Mecum, Erin E, PA-C  mupirocin cream (BACTROBAN) 2 % Apply 1 Application topically 2 (two) times daily. Apply twice a day to the inside of both nostrils Patient not taking: Reported on 08/18/2023 06/20/23   Campbell Lerner, MD    Physical Exam: Vitals:   09/02/23 0700 09/02/23 0723 09/02/23 0724 09/02/23 0923  BP: 132/68   (!) 155/95  Pulse: (!) 55  (!) 57 76  Resp:   18 18  Temp:  98.1 F (36.7 C)  97.8 F (36.6 C)  TempSrc:  Oral  Oral  SpO2: 97%  100% 100%  Weight:      Height:       Physical Exam Constitutional:      Appearance: She is obese.  HENT:     Head: Normocephalic and atraumatic.     Nose: Nose normal.     Mouth/Throat:     Mouth: Mucous membranes are moist.  Eyes:     Pupils: Pupils are equal, round, and reactive to light.  Cardiovascular:     Rate and Rhythm: Normal rate and regular rhythm.   Pulmonary:     Effort: Pulmonary effort is normal.  Abdominal:     Comments: Positive epigastric, periumbilical as well as left lower quadrant tenderness to palpation mild to moderate.  Skin:    General: Skin is warm.  Neurological:     General: No focal deficit present.  Psychiatric:        Mood and Affect: Mood normal.     Data Reviewed:  There are no new results to review at this time.  CT ABDOMEN PELVIS W CONTRAST CLINICAL DATA:  Acute severe pancreatitis  EXAM: CT ABDOMEN AND PELVIS WITH CONTRAST  TECHNIQUE: Multidetector CT imaging of the abdomen and pelvis was performed using the standard protocol following bolus administration of intravenous contrast.  RADIATION DOSE REDUCTION: This exam was performed according to the departmental dose-optimization program which includes automated exposure control, adjustment of the mA and/or kV according to patient size and/or use of iterative reconstruction technique.  CONTRAST:  OMNIPAQUE IOHEXOL 300 MG/ML  SOLN  COMPARISON:  Prior CT scan of the abdomen and pelvis 02/19/2023  FINDINGS: Lower chest: No acute abnormality.  Hepatobiliary: No focal liver abnormality is seen. Status post cholecystectomy. No biliary dilatation.  Pancreas: Unremarkable. No pancreatic ductal dilatation or surrounding inflammatory changes.  Spleen: No splenic injury or perisplenic hematoma.  Adrenals/Urinary Tract: Adrenal glands are unremarkable. Kidneys are normal, without renal calculi, focal lesion, or hydronephrosis. Bladder is unremarkable.  Stomach/Bowel: Stomach is within normal limits. Appendix is surgically absent. No evidence of bowel wall thickening, distention, or inflammatory changes.  Vascular/Lymphatic: No significant vascular findings are present. No enlarged abdominal or pelvic lymph nodes.  Reproductive: Uterus and bilateral adnexa are unremarkable.  Other: No abdominal wall hernia or abnormality. No  abdominopelvic ascites.  Musculoskeletal: No acute or significant osseous findings. Focal L5-S1 degenerative disc disease.  IMPRESSION: 1. No CT evidence of pancreatitis. 2. Status post cholecystectomy and appendectomy. 3. Focal L5-S1 degenerative disc disease.  Electronically Signed   By: Malachy Moan M.D.   On: 09/02/2023 07:24  Lab Results  Component Value Date   WBC 11.4 (H) 09/01/2023   HGB 13.2 09/01/2023   HCT 39.9 09/01/2023   MCV 88.1 09/01/2023   PLT 453 (H) 09/01/2023   Last metabolic panel Lab Results  Component Value Date   GLUCOSE 92 09/01/2023   NA 138 09/01/2023   K 3.6 09/01/2023   CL 104 09/01/2023   CO2 25 09/01/2023   BUN 7 09/01/2023   CREATININE 0.74 09/01/2023   GFRNONAA >60 09/01/2023   CALCIUM 8.8 (L) 09/01/2023   PROT 7.1 09/01/2023   ALBUMIN 3.9 09/01/2023   LABGLOB 2.5 05/15/2023   BILITOT 0.3 09/01/2023   ALKPHOS 76 09/01/2023   AST 16 09/01/2023   ALT 18 09/01/2023   ANIONGAP 9 09/01/2023    Assessment and Plan:  Pancreatitis Progressive left lower quadrant periumbilical abdominal pain moderate to severe over the past 1 to 2 days Lipase 1200 CT scan grossly stable LFTs within normal limits Noted to be status postcholecystectomy September 2024 Will otherwise treat clinically as patient does report a high fat diet Tobacco abuse confounding issue  Triglyceride level pending Pain control Antiemetics Aggressive IV fluid hydration Monitor  GERD (gastroesophageal reflux disease) Reproducible epigastric pain in the setting of overlapping pancreatitis and high-dose NSAID use Concern for NSAID induced gastritis IV PPI Discussed NSAID avoidance Monitor  Obesity (BMI 30-39.9) BMI 34  Gallstones Status post cholecystectomy September 2024  Smoker 1/2 ppd 1/2 to 1 pack/day smoker Discussed cessation at length Nicotine patch Monitor   Greater than 50% was spent in counseling and coordination of care with patient Total  encounter time 80 minutes or more    Advance Care Planning:   Code Status: Not on file   Consults: None   Family Communication: No family at the bedside   Severity of Illness: The appropriate patient status for this patient is OBSERVATION. Observation status is judged to be reasonable and necessary in order to provide the required intensity of service to ensure the patient's safety. The patient's presenting symptoms, physical exam findings, and initial radiographic and laboratory data in the context of their medical condition is felt to place them at decreased risk for further clinical deterioration. Furthermore, it is anticipated that the patient will be medically stable for discharge from the hospital within 2 midnights of admission.   Author: Floydene Flock, MD 09/02/2023 10:13 AM  For on call review [email or web address].

## 2023-09-02 NOTE — ED Notes (Addendum)
Patient was getting vitals updated and then was informed she was going to be taken to a room. Pt became irritable and argumentative with EDT. Pt was seen ambulating outside of department with independent and steady gait

## 2023-09-02 NOTE — Assessment & Plan Note (Signed)
1/2 to 1 pack/day smoker Discussed cessation at length Nicotine patch Monitor

## 2023-09-02 NOTE — ED Notes (Signed)
EDP at [facility], pt up to b/r, steady gait.

## 2023-09-02 NOTE — Assessment & Plan Note (Addendum)
Progressive left lower quadrant periumbilical abdominal pain moderate to severe over the past 1 to 2 days Lipase 1200 CT scan grossly stable LFTs within normal limits Noted to be status postcholecystectomy September 2024 Will otherwise treat clinically as patient does report a high fat diet Tobacco abuse confounding issue  Triglyceride level pending Pain control Antiemetics Aggressive IV fluid hydration Monitor

## 2023-09-02 NOTE — ED Provider Notes (Addendum)
[facility] Provider Note    Event Date/Time   First MD Initiated Contact with Patient 09/02/23 [phone]     (approximate)   History   Abdominal Pain   HPI  Briana Lyons is a 42 year old female presenting to the emergency room for evaluation of abdominal pain.  Reports a history of some intermittent abdominal and side pain for many months, but a few days ago had worsened pain with associated nausea and vomiting.  No fevers or chills.  History of cholecystectomy a few months ago.  No diarrhea.     Physical Exam   Triage Vital Signs: ED Triage Vitals  Encounter Vitals Group     BP 09/01/23 2141 132/86     Systolic BP Percentile --      Diastolic BP Percentile --      Pulse Rate 09/01/23 2141 82     Resp 09/01/23 2141 19     Temp 09/01/23 2141 97.8 F (36.6 C)     Temp Source 09/01/23 2141 Oral     SpO2 09/01/23 2141 98 %     Weight 09/01/23 2140 193 lb 12.6 oz (87.9 kg)     Height 09/01/23 2140 5\' 3"  (1.6 m)     Head Circumference --      Peak Flow --      Pain Score 09/01/23 2139 8     Pain Loc --      Pain Education --      Exclude from Growth Chart --     Most recent vital signs: Vitals:   09/02/23 0630 09/02/23 0700  BP: 118/75 132/68  Pulse: (!) 56 (!) 55  Resp:    Temp:    SpO2: 97% 97%     General: Awake, interactive  CV:  Bradycardic with regular rhythm, normal peripheral perfusion Resp:  Unlabored respirations, lungs clear to auscultation Abd:  Soft, tender to palpation throughout the upper abdomen without rebound or guarding, no significant tenderness over the lower abdomen  Neuro:  Symmetric facial movement, fluid speech   ED Results / Procedures / Treatments   Labs (all labs ordered are listed, but only abnormal results are displayed) Labs Reviewed  LIPASE, BLOOD - Abnormal; Notable for the following components:      Result Value   Lipase 1,272 (*)    All other components within normal limits  COMPREHENSIVE  METABOLIC PANEL - Abnormal; Notable for the following components:   Calcium 8.8 (*)    All other components within normal limits  CBC - Abnormal; Notable for the following components:   WBC 11.4 (*)    Platelets 453 (*)    All other components within normal limits  URINALYSIS, ROUTINE W REFLEX MICROSCOPIC - Abnormal; Notable for the following components:   Color, Urine YELLOW (*)    APPearance HAZY (*)    Hgb urine dipstick SMALL (*)    Leukocytes,Ua MODERATE (*)    Bacteria, UA RARE (*)    All other components within normal limits  POC URINE PREG, ED     EKG EKG independently reviewed interpreted by myself (ER attending) demonstrates:    RADIOLOGY Imaging independently reviewed and interpreted by myself demonstrates:  CT abdomen pelvis without bowel obstruction, formal radiology read pending  PROCEDURES:  Critical Care performed: No  Procedures   MEDICATIONS ORDERED IN ED: Medications  sodium chloride 0.9 % bolus 1,000 mL ( Intravenous Restarted 09/02/23 0600)  ondansetron (ZOFRAN) injection 4 mg (4 mg Intravenous Given 09/02/23 0506)  HYDROmorphone (DILAUDID) injection 0.5 mg (0.5 mg Intravenous Given 09/02/23 0506)  iohexol (OMNIPAQUE) 300 MG/ML solution 100 mL (100 mLs Intravenous Contrast Given 09/02/23 0515)     IMPRESSION / MDM / ASSESSMENT AND PLAN / ED COURSE  I reviewed the triage vital signs and the nursing notes.  Differential diagnosis includes, but is not limited to, pancreatitis, renal stone, appendicitis, UTI, lower suspicion aortic pathology  Patient's presentation is most consistent with acute presentation with potential threat to life or bodily function.  42 year old female presenting to the emerged department for evaluation of abdominal pain.  Vital stable on presentation.  Labs notable for elevated lipase at 1272, mild leukocytosis at 11.4.  Patient reports rare alcohol use, s/p cholecystectomy with normal LFTs lower suspicion for gallstone  pancreatitis. Will obtain CT to further evaluate for potential complications of pancreatitis, but ultimately suspect patient will be appropriate for admission for further management of her acute pancreatitis.  Signed out to oncoming physician pending CT read and disposition.     FINAL CLINICAL IMPRESSION(S) / ED DIAGNOSES   Final diagnoses:  Acute pancreatitis, unspecified complication status, unspecified pancreatitis type     Rx / DC Orders   ED Discharge Orders     None        Note:  This document was prepared using Dragon voice recognition software and may include unintentional dictation errors.   Trinna Post, MD 09/02/23 7829    Trinna Post, MD 09/02/23 [phone]

## 2023-09-02 NOTE — ED Notes (Signed)
Pt presented back to ED requesting to go to room. RN informed pt she thought she was leaving, but was in the process of working with charge to make a plan to get patients back. Of which, she would likely be one of the next to go. Pt stated she was outside smoking "because I literally just woke up." Pt expressed irritation at not being taken immediately to room. RN reminded pt that staff had attempted to take pt to room, when she became irritable and was witnessed leaving department. Pt now sitting back in lobby awaiting room.

## 2023-09-02 NOTE — Plan of Care (Signed)
  Problem: Activity: Goal: Risk for activity intolerance will decrease Outcome: Progressing   

## 2023-09-02 NOTE — Assessment & Plan Note (Addendum)
Reproducible epigastric pain in the setting of overlapping pancreatitis and high-dose NSAID use Concern for NSAID induced gastritis IV PPI Discussed NSAID avoidance Monitor

## 2023-09-02 NOTE — ED Provider Notes (Signed)
Consulted with hospitalist due to concerns for severely elevated lipase/possible pancreatitis.  At this point no evidence of transaminitis elevated bilirubin or biliary dilation on CT.  Patient reports pain is somewhat returning, previously hydromorphone was working well for her.  She is able to ambulate independently back forth the bathroom which is reassuring but given the degree of elevation of her lipase and presentation suspect she may need bowel rest in the hospital.  Discussed with patient agreeable with plan for admission for pain control, bowel rest.  Consulted with and patient accepted to hospitalist by Dr. Amada Kingfisher, MD 09/02/23 [phone]

## 2023-09-02 NOTE — ED Notes (Signed)
Patient sleeping, no acute distress noted, respirations even and unlabored.

## 2023-09-02 NOTE — Assessment & Plan Note (Signed)
BMI 34 

## 2023-09-02 NOTE — Assessment & Plan Note (Signed)
Status post cholecystectomy September 2024

## 2023-09-03 DIAGNOSIS — Z9049 Acquired absence of other specified parts of digestive tract: Secondary | ICD-10-CM | POA: Diagnosis not present

## 2023-09-03 DIAGNOSIS — F319 Bipolar disorder, unspecified: Secondary | ICD-10-CM | POA: Diagnosis present

## 2023-09-03 DIAGNOSIS — K859 Acute pancreatitis without necrosis or infection, unspecified: Secondary | ICD-10-CM | POA: Diagnosis not present

## 2023-09-03 DIAGNOSIS — M51369 Other intervertebral disc degeneration, lumbar region without mention of lumbar back pain or lower extremity pain: Secondary | ICD-10-CM | POA: Diagnosis present

## 2023-09-03 DIAGNOSIS — Z885 Allergy status to narcotic agent status: Secondary | ICD-10-CM | POA: Diagnosis not present

## 2023-09-03 DIAGNOSIS — Z6834 Body mass index (BMI) 34.0-34.9, adult: Secondary | ICD-10-CM | POA: Diagnosis not present

## 2023-09-03 DIAGNOSIS — K8020 Calculus of gallbladder without cholecystitis without obstruction: Secondary | ICD-10-CM | POA: Diagnosis present

## 2023-09-03 DIAGNOSIS — K219 Gastro-esophageal reflux disease without esophagitis: Secondary | ICD-10-CM | POA: Diagnosis present

## 2023-09-03 DIAGNOSIS — M51379 Other intervertebral disc degeneration, lumbosacral region without mention of lumbar back pain or lower extremity pain: Secondary | ICD-10-CM | POA: Diagnosis present

## 2023-09-03 DIAGNOSIS — E669 Obesity, unspecified: Secondary | ICD-10-CM | POA: Diagnosis present

## 2023-09-03 DIAGNOSIS — K2970 Gastritis, unspecified, without bleeding: Secondary | ICD-10-CM | POA: Diagnosis present

## 2023-09-03 DIAGNOSIS — T39391A Poisoning by other nonsteroidal anti-inflammatory drugs [NSAID], accidental (unintentional), initial encounter: Secondary | ICD-10-CM | POA: Diagnosis present

## 2023-09-03 DIAGNOSIS — R109 Unspecified abdominal pain: Secondary | ICD-10-CM | POA: Diagnosis present

## 2023-09-03 DIAGNOSIS — F17210 Nicotine dependence, cigarettes, uncomplicated: Secondary | ICD-10-CM | POA: Diagnosis present

## 2023-09-03 MED ORDER — LACTULOSE 10 GM/15ML PO SOLN
30.0000 g | Freq: Two times a day (BID) | ORAL | Status: DC | PRN
  Administered 2023-09-03: 30 g via ORAL
  Filled 2023-09-03: qty 60

## 2023-09-03 MED ORDER — ENOXAPARIN SODIUM 40 MG/0.4ML IJ SOSY
40.0000 mg | PREFILLED_SYRINGE | Freq: Every day | INTRAMUSCULAR | Status: DC
  Administered 2023-09-03: 40 mg via SUBCUTANEOUS
  Filled 2023-09-03: qty 0.4

## 2023-09-03 MED ORDER — SODIUM CHLORIDE 0.9 % IV BOLUS
1000.0000 mL | Freq: Once | INTRAVENOUS | Status: AC
  Administered 2023-09-03: 250 mL via INTRAVENOUS

## 2023-09-03 NOTE — Progress Notes (Signed)
Progress Note   Patient: Briana Lyons WUX:[id number] DOB: [birth year] DOA: 09/02/2023     0 DOS: the patient was seen and examined on 09/03/2023   Brief hospital course: Natalia Gallop is a 42 y.o. female with medical history significant of obesity, depression, GERD presenting with pancreatitis, gastritis.  Patient reports worsening left lower quadrant periumbilical as well as upper abdominal pain for the past 1 to 2 days.  Symptoms have progressively worsened over this timeframe.  No fevers or chills.  Positive nausea.  No chest pain or shortness of breath.  Noted had gallbladder removal September of this year.  No diarrhea.  Patient does report taking regular high-dose NSAIDs including Goody powders and ibuprofen for generalized pain.  Also does admit to eating a high fat diet.  Denies any alcohol use.  Currently smoking 1/2 to 1 pack/day.  Denies any prior episodes like this past.  No reported abdominal trauma.  No focal hemiparesis or confusion.  No reported dysuria. Presented to the ER afebrile, hemodynamically stable.  Satting well on room air.  White count 11.4, hemoglobin 13.2, platelets 453, creatinine 0.74, LFTs within normal limits.  Lipase 1272.  Urinalysis mildly indicative of infection.  U preg negative.  CT abdomen/pelvis grossly within normal limits.  Assessment and Plan: Acute pancreatitis Progressive left lower quadrant periumbilical abdominal pain moderate to severe over the past 1 to 2 days prior to presentation in the setting of elevated Lipase of 1200 CT scan grossly stable LFTs within normal limits Noted to be status postcholecystectomy September 2024 Continue aggressive IV fluid Continue current pain regiment Triglyceride level of 103 unlikely to be the precipitant    GERD (gastroesophageal reflux disease) Reproducible epigastric pain in the setting of overlapping pancreatitis and high-dose NSAID use Concern for NSAID induced gastritis Continue IV PPI therapy Avoidance of  NSAIDs    Obesity (BMI 30-39.9) BMI 34 Counseled on weight loss when medically stable   Gallstones Status post cholecystectomy September 2024   Smoker 1/2 ppd 1/2 to 1 pack/day smoker Discussed cessation at length Continue nicotine patch      Advance Care Planning:   Code Status: Not on file    Consults: None    Family Communication: No family at the bedside      Subjective:  Patient seen and examined at bedside this morning Abdominal pain improving however still having some pain Diet have been advanced Denies nausea vomiting chest pain or cough  Physical Exam:  HENT:     Head: Normocephalic and atraumatic.     Nose: Nose normal.     Mouth/Throat:     Mouth: Mucous membranes are moist.  Eyes:     Pupils: Pupils are equal, round, and reactive to light.  Cardiovascular:     Rate and Rhythm: Normal rate and regular rhythm.  Pulmonary:     Effort: Pulmonary effort is normal.  Abdominal:     Comments: Epigastric tenderness improved Skin:    General: Skin is warm.  Neurological:     General: No focal deficit present.  Psychiatric:        Mood and Affect: Mood normal  Vitals:   09/02/23 0923 09/02/23 2018 09/03/23 0438 09/03/23 0847  BP: (!) 155/95 112/64 128/77 124/63  Pulse: 76 89 72 70  Resp: 18  16 18   Temp: 97.8 F (36.6 C) 98.7 F (37.1 C)  98.7 F (37.1 C)  TempSrc: Oral   Oral  SpO2: 100% 97% 100% 98%  Weight:  Height:        Data Reviewed: I have reviewed the patient's abdominal CAT scan that showed no acute intra-abdominal pathology except L5-S1 degenerative disc disease    Latest Ref Rng & Units 09/01/2023    9:41 PM 05/15/2023   11:38 AM 04/24/2023   10:29 AM  CBC  WBC 4.0 - 10.5 K/uL 11.4  8.8  7.6   Hemoglobin 12.0 - 15.0 g/dL 40.9  81.1  91.4   Hematocrit 36.0 - 46.0 % 39.9  39.0  40.3   Platelets 150 - 400 K/uL 453  381  401        Latest Ref Rng & Units 09/01/2023    9:41 PM 05/15/2023   11:38 AM 04/24/2023   10:29 AM  BMP   Glucose 70 - 99 mg/dL 92  82  90   BUN 6 - 20 mg/dL 7  10  10    Creatinine 0.44 - 1.00 mg/dL 7.82  9.56  2.13   BUN/Creat Ratio 9 - 23  13  13    Sodium 135 - 145 mmol/L 138  143  142   Potassium 3.5 - 5.1 mmol/L 3.6  4.1  3.9   Chloride 98 - 111 mmol/L 104  103  101   CO2 22 - 32 mmol/L 25  25  25    Calcium 8.9 - 10.3 mg/dL 8.8  9.5  9.7      Time spent: 56 minutes spent explaining patient's condition to her, reviewing patient's lab report as well as imaging and discussing goals of care with patient.  More than 50% of the time was spent at bedside with patient  Author: Loyce Dys, MD 09/03/2023 12:53 PM  For on call review [email or web address].

## 2023-09-03 NOTE — Plan of Care (Signed)
  Problem: Education: Goal: Knowledge of General Education information will improve Description Including pain rating scale, medication(s)/side effects and non-pharmacologic comfort measures Outcome: Progressing   

## 2023-09-04 ENCOUNTER — Encounter: Payer: Self-pay | Admitting: Internal Medicine

## 2023-09-04 DIAGNOSIS — K859 Acute pancreatitis without necrosis or infection, unspecified: Secondary | ICD-10-CM | POA: Diagnosis not present

## 2023-09-04 LAB — CBC WITH DIFFERENTIAL/PLATELET
Abs Immature Granulocytes: 0.03 10*3/uL (ref 0.00–0.07)
Basophils Absolute: 0.1 10*3/uL (ref 0.0–0.1)
Basophils Relative: 1 %
Eosinophils Absolute: 0.3 10*3/uL (ref 0.0–0.5)
Eosinophils Relative: 4 %
HCT: 35.3 % — ABNORMAL LOW (ref 36.0–46.0)
Hemoglobin: 11.6 g/dL — ABNORMAL LOW (ref 12.0–15.0)
Immature Granulocytes: 0 %
Lymphocytes Relative: 25 %
Lymphs Abs: 2 10*3/uL (ref 0.7–4.0)
MCH: 29.4 pg (ref 26.0–34.0)
MCHC: 32.9 g/dL (ref 30.0–36.0)
MCV: 89.6 fL (ref 80.0–100.0)
Monocytes Absolute: 0.6 10*3/uL (ref 0.1–1.0)
Monocytes Relative: 7 %
Neutro Abs: 5 10*3/uL (ref 1.7–7.7)
Neutrophils Relative %: 63 %
Platelets: 369 10*3/uL (ref 150–400)
RBC: 3.94 MIL/uL (ref 3.87–5.11)
RDW: 13.7 % (ref 11.5–15.5)
WBC: 7.9 10*3/uL (ref 4.0–10.5)
nRBC: 0 % (ref 0.0–0.2)

## 2023-09-04 LAB — BASIC METABOLIC PANEL
Anion gap: 8 (ref 5–15)
BUN: 6 mg/dL (ref 6–20)
CO2: 26 mmol/L (ref 22–32)
Calcium: 8.6 mg/dL — ABNORMAL LOW (ref 8.9–10.3)
Chloride: 104 mmol/L (ref 98–111)
Creatinine, Ser: 0.63 mg/dL (ref 0.44–1.00)
GFR, Estimated: >60 mL/min (ref >60)
Glucose, Bld: 110 mg/dL — ABNORMAL HIGH (ref 70–99)
Potassium: 3.5 mmol/L (ref 3.5–5.1)
Sodium: 138 mmol/L (ref 135–145)

## 2023-09-04 MED ORDER — FLUCONAZOLE 50 MG PO TABS
150.0000 mg | ORAL_TABLET | Freq: Once | ORAL | Status: AC
  Administered 2023-09-04: 150 mg via ORAL
  Filled 2023-09-04: qty 1

## 2023-09-04 MED ORDER — HYDROCODONE-ACETAMINOPHEN 5-325 MG PO TABS: 1.0000 | ORAL_TABLET | Freq: Four times a day (QID) | ORAL | 0 refills | Status: DC | PRN

## 2023-09-04 MED ORDER — PANTOPRAZOLE SODIUM 40 MG PO TBEC: 40.0000 mg | DELAYED_RELEASE_TABLET | Freq: Every day | ORAL | 1 refills | Status: DC

## 2023-09-04 MED ORDER — LACTULOSE 10 GM/15ML PO SOLN: 20.0000 g | Freq: Two times a day (BID) | ORAL | 0 refills | Status: DC | PRN

## 2023-09-04 NOTE — Progress Notes (Signed)
Attempted to remove patient IV for discharge. Patient states she "has 15 more minutes until she can have her IV dilaudid and she needs one more hit of dilaudid before she leaves here." IV not removed.

## 2023-09-04 NOTE — Progress Notes (Signed)
While assessing this patient's pain, patient stated repeatedly "I need my damn pain medicine. Get out of here and get my damn pain medicine. I'm sick of this shit." Explained the need to assess patient's pain prior to getting pain medication to ensure medication is provided correctly per policy. Patient states "get your uppity ass out of here, I want a new nurse. Damn white girl, uppity as hell, get out of my room." Patient requesting new nurse and dilaudid at this time.    09/04/23 0814  Pain Assessment  Pain Scale 0-10  Pain Score 9  Pain Type Chronic pain  Pain Location Back  Pain Descriptors / Indicators Throbbing  Pain Frequency Constant  Pain Onset On-going  Pain Intervention(s) Other (Comment)

## 2023-09-04 NOTE — Discharge Summary (Signed)
Physician Discharge Summary   Patient: Briana Lyons MRN: [id number] DOB: [birth year]  Admit date:     09/02/2023  Discharge date: 09/04/23  Discharge Physician: Loyce Dys   PCP: Larae Grooms, NP   Recommendations at discharge:  Follow-up with PCP  Discharge Diagnoses: Acute pancreatitis GERD (gastroesophageal reflux disease) Obesity (BMI 30-39.9) Gallstones Smoker 1/2 ppd   Hospital Course: Briana Lyons is a 42 y.o. female with medical history significant of obesity, depression, GERD presenting with pancreatitis, gastritis.  Patient reports worsening left lower quadrant periumbilical as well as upper abdominal pain for the past 1 to 2 days. Noted had gallbladder removal September of this year.   Lipase 1272.  Urinalysis mildly indicative of infection.  U preg negative.  CT abdomen/pelvis grossly within normal limits but showed findings of L5 L-spine degenerative disc disease.  But showed findings of L5-S1 degenerative disc disease.  Abdominal pain now resolved and therefore being discharged to follow-up with primary care physician  Consultants: none Procedures performed: none  Disposition: Home Diet recommendation:  Cardiac diet DISCHARGE MEDICATION: Allergies as of 09/04/2023       Reactions   Codeine Nausea And Vomiting   Tylenol With Codeine #3 [acetaminophen-codeine]         Medication List     STOP taking these medications    fluconazole 150 MG tablet Commonly known as: DIFLUCAN   ibuprofen 800 MG tablet Commonly known as: ADVIL   metroNIDAZOLE 500 MG tablet Commonly known as: FLAGYL   mupirocin cream 2 % Commonly known as: BACTROBAN       TAKE these medications    HYDROcodone-acetaminophen 5-325 MG tablet Commonly known as: NORCO/VICODIN Take 1 tablet by mouth every 6 (six) hours as needed for moderate pain (pain score 4-6).   lactulose 10 GM/15ML solution Commonly known as: CHRONULAC Take 30 mLs (20 g total) by mouth 2 (two) times daily  as needed for mild constipation or moderate constipation.   pantoprazole 40 MG tablet Commonly known as: Protonix Take 1 tablet (40 mg total) by mouth daily.        Discharge Exam: Filed Weights   09/01/23 2140  Weight: 87.9 kg   HENT:     Head: Normocephalic and atraumatic.     Nose: Nose normal.     Mouth/Throat:     Mouth: Mucous membranes are moist.  Eyes:     Pupils: Pupils are equal, round, and reactive to light.  Cardiovascular:     Rate and Rhythm: Normal rate and regular rhythm.  Pulmonary:     Effort: Pulmonary effort is normal.  Abdominal:     Comments: Epigastric tenderness improved Skin:    General: Skin is warm.  Neurological:     General: No focal deficit present.  Psychiatric:        Mood and Affect: Mood normal  Condition at discharge: good  The results of significant diagnostics from this hospitalization (including imaging, microbiology, ancillary and laboratory) are listed below for reference.   Imaging Studies: CT ABDOMEN PELVIS W CONTRAST  Result Date: 09/02/2023 CLINICAL DATA:  Acute severe pancreatitis EXAM: CT ABDOMEN AND PELVIS WITH CONTRAST TECHNIQUE: Multidetector CT imaging of the abdomen and pelvis was performed using the standard protocol following bolus administration of intravenous contrast. RADIATION DOSE REDUCTION: This exam was performed according to the departmental dose-optimization program which includes automated exposure control, adjustment of the mA and/or kV according to patient size and/or use of iterative reconstruction technique. CONTRAST:  OMNIPAQUE IOHEXOL 300 MG/ML  SOLN COMPARISON:  Prior CT scan of the abdomen and pelvis 02/19/2023 FINDINGS: Lower chest: No acute abnormality. Hepatobiliary: No focal liver abnormality is seen. Status post cholecystectomy. No biliary dilatation. Pancreas: Unremarkable. No pancreatic ductal dilatation or surrounding inflammatory changes. Spleen: No splenic injury or perisplenic hematoma.  Adrenals/Urinary Tract: Adrenal glands are unremarkable. Kidneys are normal, without renal calculi, focal lesion, or hydronephrosis. Bladder is unremarkable. Stomach/Bowel: Stomach is within normal limits. Appendix is surgically absent. No evidence of bowel wall thickening, distention, or inflammatory changes. Vascular/Lymphatic: No significant vascular findings are present. No enlarged abdominal or pelvic lymph nodes. Reproductive: Uterus and bilateral adnexa are unremarkable. Other: No abdominal wall hernia or abnormality. No abdominopelvic ascites. Musculoskeletal: No acute or significant osseous findings. Focal L5-S1 degenerative disc disease. IMPRESSION: 1. No CT evidence of pancreatitis. 2. Status post cholecystectomy and appendectomy. 3. Focal L5-S1 degenerative disc disease. Electronically Signed   By: Malachy Moan M.D.   On: 09/02/2023 07:24    Microbiology: Results for orders placed or performed in visit on 08/18/23  Urine Culture     Status: Abnormal   Collection Time: 08/18/23  4:10 PM   Specimen: Urine  Result Value Ref Range Status   MICRO NUMBER: [id number]  Final   SPECIMEN QUALITY: Adequate  Final   Sample Source URINE, CLEAN CATCH  Final   STATUS: FINAL  Final   ISOLATE 1: Streptococcus agalactiae (A)  Final    Comment: Greater than 100,000 CFU/mL of Group B Streptococcus isolated Beta-hemolytic streptococci are predictably susceptible to Penicillin and other beta-lactams. Susceptibility testing not routinely performed. Please contact the laboratory within 3 days if  susceptibility testing is desired. Erythromycin and clindamycin are not recommended for treatment of urinary tract infections, but clindamycin may be useful for treatment of rectovaginal colonization or infection. Any amount of group B Streptococcus in  urine specimens obtained from pregnant females is a marker of genital tract colonization. If this patient is pregnant, please refer to ACOG guidelines for appropriate  screening and management of pregnant women.     Labs: CBC: Recent Labs  Lab 09/01/23 2141 09/04/23 0436  WBC 11.4* 7.9  NEUTROABS  --  5.0  HGB 13.2 11.6*  HCT 39.9 35.3*  MCV 88.1 89.6  PLT 453* 369   Basic Metabolic Panel: Recent Labs  Lab 09/01/23 2141 09/04/23 0436  NA 138 138  K 3.6 3.5  CL 104 104  CO2 25 26  GLUCOSE 92 110*  BUN 7 6  CREATININE 0.74 0.63  CALCIUM 8.8* 8.6*   Liver Function Tests: Recent Labs  Lab 09/01/23 2141  AST 16  ALT 18  ALKPHOS 76  BILITOT 0.3  PROT 7.1  ALBUMIN 3.9   CBG: No results for input(s): "GLUCAP" in the last 168 hours.  Discharge time spent:  38 minutes.  Signed: Loyce Dys, MD [facility] 09/04/2023

## 2023-09-04 NOTE — Progress Notes (Signed)
Patient requesting pain medication for "9/10" back pain at this time. Patient A&Ox4 in no distress eating breakfast at this time. Cola provided as requested, informed patient her pain medication wasn't due until 1130. Patient states she can wait. Patient states her back pain is due to the bed. Encouraged ambulation and sitting in recliner.

## 2023-09-04 NOTE — TOC CM/SW Note (Signed)
Transition of Care [facility]) - Inpatient Brief Assessment   Patient Details  Name: Kawena Gally MRN: [id number] Date of Birth: 07-Oct-1981  Transition of Care [facility]) CM/SW Contact:    Margarito Liner, LCSW Phone Number: 09/04/2023, 11:08 AM   Clinical Narrative: Patient has orders to discharge home today. CSW reviewed chart. No TOC needs identified. CSW signing off.  Transition of Care Asessment: Insurance and Status: Insurance coverage has been reviewed Patient has primary care physician: Yes Home environment has been reviewed: Single family home Prior level of function:: Not documented Prior/Current Home Services: No current home services Social Determinants of Health Reivew: SDOH reviewed no interventions necessary Readmission risk has been reviewed: Yes Transition of care needs: no transition of care needs at this time

## 2023-09-05 ENCOUNTER — Telehealth: Payer: Self-pay

## 2023-09-05 NOTE — Patient Instructions (Signed)
Visit Information  Thank you for taking time to visit with me today. Please don't hesitate to contact me if I can be of assistance to you before our next scheduled telephone appointment.   The patient verbalized understanding of instructions, educational materials, and care plan provided today and agreed to receive a mailed copy of patient instructions, educational materials, and care plan.   I hope this gets to your and is helpful!   Deidre Ala, RN Medical illustrator [facility] [phone]

## 2023-09-05 NOTE — Transitions of Care (Post Inpatient/ED Visit) (Signed)
   09/05/2023  Name: Briana Lyons MRN: [id number] DOB: 1981/08/16  Today's TOC FU Call Status: Today's TOC FU Call Status:: Successful TOC FU Call Completed TOC FU Call Complete Date: 09/05/23 Patient's Name and Date of Birth confirmed.  Transition Care Management Follow-up Telephone Call Date of Discharge: 09/04/23 Discharge Facility: [facility]) Type of Discharge: Inpatient Admission Primary Inpatient Discharge Diagnosis:: Pancreatitis How have you been since you were released from the hospital?: Better Any questions or concerns?: No  Items Reviewed: Did you receive and understand the discharge instructions provided?: Yes Medications obtained,verified, and reconciled?: Yes (Medications Reviewed) Any new allergies since your discharge?: No Dietary orders reviewed?: NA Do you have support at home?: Yes People in Home: other relative(s) Name of Support/Comfort Primary Source: Waymon Amato  Medications Reviewed Today: Medications Reviewed Today     Reviewed by Redge Gainer, RN (Case Manager) on 09/05/23 at 1208  Med List Status: <None>   Medication Order Taking? Sig Documenting Provider Last Dose Status Informant  HYDROcodone-acetaminophen (NORCO/VICODIN) 5-325 MG tablet [id number]  Take 1 tablet by mouth every 6 (six) hours as needed for moderate pain (pain score 4-6). Loyce Dys, MD  Active   lactulose (CHRONULAC) 10 GM/15ML solution [id number]  Take 30 mLs (20 g total) by mouth 2 (two) times daily as needed for mild constipation or moderate constipation. Loyce Dys, MD  Active   pantoprazole (PROTONIX) 40 MG tablet [id number]  Take 1 tablet (40 mg total) by mouth daily. Loyce Dys, MD  Active             Home Care and Equipment/Supplies: Were Home Health Services Ordered?: NA Any new equipment or medical supplies ordered?: NA  Functional Questionnaire: Do you need assistance with bathing/showering or dressing?: No Do you need  assistance with meal preparation?: No Do you need assistance with eating?: No Do you have difficulty maintaining continence: No Do you need assistance with getting out of bed/getting out of a chair/moving?: No Do you have difficulty managing or taking your medications?: No  Follow up appointments reviewed: PCP Follow-up appointment confirmed?: Yes Date of PCP follow-up appointment?: 09/18/23 Follow-up Provider: Larae Grooms [facility] Follow-up appointment confirmed?: NA Do you need transportation to your follow-up appointment?: No Do you understand care options if your condition(s) worsen?: Yes-patient verbalized understanding  SDOH Interventions Today    Flowsheet Row Most Recent Value  SDOH Interventions   Food Insecurity Interventions Intervention Not Indicated  Transportation Interventions Intervention Not Indicated      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 She feels her Pancreatitis is related to a bunch of different foods she has eaten recently. She had her Gallbladder removed in September and she states she needs a RD referral so she knows what to eat. She is taking her pain medication sparingly and she is using more for her back pain since she just found out she has compression at L5. She will talk to her provider at her follow up visit about referral for pain management or for Neurosurgery. RNCM to research nutritional information and mail to the patient.   Deidre Ala, RN Medical illustrator [facility] [phone]

## 2023-09-18 ENCOUNTER — Ambulatory Visit (INDEPENDENT_AMBULATORY_CARE_PROVIDER_SITE_OTHER): Payer: Medicaid Other | Admitting: Nurse Practitioner

## 2023-09-18 ENCOUNTER — Ambulatory Visit
Admission: RE | Admit: 2023-09-18 | Discharge: 2023-09-18 | Disposition: A | Discharge status: Home / Self Care | Payer: Medicaid Other | Source: Ambulatory Visit | Attending: Nurse Practitioner

## 2023-09-18 ENCOUNTER — Ambulatory Visit
Admission: RE | Admit: 2023-09-18 | Discharge: 2023-09-18 | Disposition: A | Discharge status: Home / Self Care | Payer: Medicaid Other | Attending: Nurse Practitioner | Admitting: Nurse Practitioner

## 2023-09-18 ENCOUNTER — Encounter: Payer: Self-pay | Admitting: Nurse Practitioner

## 2023-09-18 VITALS — BP 118/76 | HR 94 | Temp 98.6°F | Ht 63.0 in | Wt 192.0 lb

## 2023-09-18 DIAGNOSIS — R051 Acute cough: Secondary | ICD-10-CM | POA: Diagnosis not present

## 2023-09-18 DIAGNOSIS — M9901 Segmental and somatic dysfunction of cervical region: Secondary | ICD-10-CM | POA: Diagnosis not present

## 2023-09-18 DIAGNOSIS — M545 Low back pain, unspecified: Secondary | ICD-10-CM | POA: Insufficient documentation

## 2023-09-18 DIAGNOSIS — G8929 Other chronic pain: Secondary | ICD-10-CM

## 2023-09-18 DIAGNOSIS — S336XXA Sprain of sacroiliac joint, initial encounter: Secondary | ICD-10-CM | POA: Diagnosis not present

## 2023-09-18 DIAGNOSIS — F322 Major depressive disorder, single episode, severe without psychotic features: Secondary | ICD-10-CM

## 2023-09-18 DIAGNOSIS — M546 Pain in thoracic spine: Secondary | ICD-10-CM | POA: Diagnosis not present

## 2023-09-18 DIAGNOSIS — M9903 Segmental and somatic dysfunction of lumbar region: Secondary | ICD-10-CM | POA: Diagnosis not present

## 2023-09-18 DIAGNOSIS — M9902 Segmental and somatic dysfunction of thoracic region: Secondary | ICD-10-CM | POA: Diagnosis not present

## 2023-09-18 DIAGNOSIS — M542 Cervicalgia: Secondary | ICD-10-CM | POA: Diagnosis not present

## 2023-09-18 DIAGNOSIS — M5416 Radiculopathy, lumbar region: Secondary | ICD-10-CM | POA: Diagnosis not present

## 2023-09-18 DIAGNOSIS — M9904 Segmental and somatic dysfunction of sacral region: Secondary | ICD-10-CM | POA: Diagnosis not present

## 2023-09-18 MED ORDER — MELOXICAM 15 MG PO TABS: 15.0000 mg | ORAL_TABLET | Freq: Every day | ORAL | 0 refills | Status: DC
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
## 2023-09-18 NOTE — Assessment & Plan Note (Signed)
Chronic.  Not well controlled.  Suspect an underlying mood disorder.  Encouraged patient to see psychiatry for further evaluation and management.

## 2023-09-20 LAB — NOVEL CORONAVIRUS, NAA: SARS-CoV-2, NAA: DETECTED — AB

## 2023-09-25 NOTE — Addendum Note (Signed)
Addended by: Larae Grooms on: 09/25/2023 08:06 AM   Modules accepted: Orders

## 2023-10-02 NOTE — Telephone Encounter (Signed)
Patient requesting chiropractor referral to Dr. Patrici Ranks.

## 2023-10-02 NOTE — Telephone Encounter (Signed)
Patient requested a letter at her last visit for this.  I wrote it on 11/25 and it is in her chart.  Please fax that letter.

## 2023-10-02 NOTE — Telephone Encounter (Signed)
Copied from CRM [id number]. Topic: Referral - Question >> Oct 02, 2023  9:59 AM Haroldine Laws wrote: Reason for CRM: pt wants to know if she can get an ok for her to go back to Dr. Patrici Ranks for chiropractic care.  She needs this for medicaid It needs to be faxed to them.  She did not have the fax number  CB@  [phone]

## 2023-10-02 NOTE — Telephone Encounter (Signed)
Letter is available for mychart but if she needs it faxed she needs to provide more details on where it needs to be faxed and the fax number. Ok for [facility] to review and obtain.

## 2023-10-11 ENCOUNTER — Ambulatory Visit: Payer: Self-pay

## 2023-10-11 ENCOUNTER — Telehealth: Payer: Self-pay | Admitting: Nurse Practitioner

## 2023-10-11 DIAGNOSIS — M9903 Segmental and somatic dysfunction of lumbar region: Secondary | ICD-10-CM | POA: Diagnosis not present

## 2023-10-11 DIAGNOSIS — M542 Cervicalgia: Secondary | ICD-10-CM | POA: Diagnosis not present

## 2023-10-11 DIAGNOSIS — S336XXA Sprain of sacroiliac joint, initial encounter: Secondary | ICD-10-CM | POA: Diagnosis not present

## 2023-10-11 DIAGNOSIS — M5416 Radiculopathy, lumbar region: Secondary | ICD-10-CM | POA: Diagnosis not present

## 2023-10-11 DIAGNOSIS — M9901 Segmental and somatic dysfunction of cervical region: Secondary | ICD-10-CM | POA: Diagnosis not present

## 2023-10-11 DIAGNOSIS — M545 Low back pain, unspecified: Secondary | ICD-10-CM

## 2023-10-11 DIAGNOSIS — M546 Pain in thoracic spine: Secondary | ICD-10-CM | POA: Diagnosis not present

## 2023-10-11 DIAGNOSIS — M9902 Segmental and somatic dysfunction of thoracic region: Secondary | ICD-10-CM | POA: Diagnosis not present

## 2023-10-11 DIAGNOSIS — M9904 Segmental and somatic dysfunction of sacral region: Secondary | ICD-10-CM | POA: Diagnosis not present

## 2023-10-11 NOTE — Telephone Encounter (Signed)
Copied from CRM [id number]. Topic: Referral - Status >> Oct 11, 2023  3:36 PM Macon Large wrote: Reason for CRM: Patrici Ranks Chiropractor stated that the pt is there and requests an official referral be faxed to [phone]

## 2023-10-11 NOTE — Telephone Encounter (Signed)
Chief Complaint: Abdominal pain Symptoms: 6/10 pain today, 10/10 yesterday, radiating to left lower back from LLQ abdomen, nausea, vomiting, ear ache, throat pain Frequency: onset 1 week Pertinent Negatives: Patient denies other symptoms Disposition: [] ED /[] Urgent Care (no appt availability in office) / [x] Appointment(In office/virtual)/ []  [facility]/ [] Home Care/ [] Refused Recommended Disposition /[] [place]/ []  Follow-up with PCP Additional Notes: Patient says that she's having the same type of abdominal pain that she was admitted in the hospital for 5 weeks ago. She says she wants to be seen in the office today or tomorrow. Advised no availability in the office, but tomorrow I could schedule with the float provider at [facility]. She says she will just go to the ED tonight. Then she asked me to schedule her for a follow up next week and if she gets admitted she will call and let us know to cancel. Advised I can schedule on Monday 10/16/23 with PCP. She says further out in the week, advised no other availability besides Friday 10/13/23 and Monday 10/16/23, then it would be January. She says to go ahead and schedule on Friday and she will go to the ED tonight to see what they say about the abdomen and if she's admitted, she will call tomorrow to cancel.    Reason for Disposition  [1] MODERATE pain (e.g., interferes with normal activities) AND [2] pain comes and goes (cramps) AND [3] present > 24 hours  (Exception: Pain with Vomiting or Diarrhea - see that Guideline.)  Answer Assessment - Initial Assessment Questions 1. LOCATION: "Where does it hurt?"      Left lower abdomen 2. RADIATION: "Does the pain shoot anywhere else?" (e.g., chest, back)     Goes around to back 3. ONSET: "When did the pain begin?" (e.g., minutes, hours or days ago)      Last week or so 4. SUDDEN: "Gradual or sudden onset?"     Sudden onset when woke up 5. PATTERN "Does the pain come and go,  or is it constant?"    - If it comes and goes: "How long does it last?" "Do you have pain now?"     (Note: Comes and goes means the pain is intermittent. It goes away completely between bouts.)    - If constant: "Is it getting better, staying the same, or getting worse?"      (Note: Constant means the pain never goes away completely; most serious pain is constant and gets worse.)      Comes and goes 6. SEVERITY: "How bad is the pain?"  (e.g., Scale 1-10; mild, moderate, or severe)    - MILD (1-3): Doesn't interfere with normal activities, abdomen soft and not tender to touch.     - MODERATE (4-7): Interferes with normal activities or awakens from sleep, abdomen tender to touch.     - SEVERE (8-10): Excruciating pain, doubled over, unable to do any normal activities.       6 today, 10 yesterday 7. RECURRENT SYMPTOM: "Have you ever had this type of stomach pain before?" If Yes, ask: "When was the last time?" and "What happened that time?"      Yes, admitted 5 weeks ago due to the pain 8. CAUSE: "What do you think is causing the stomach pain?"     Unsure 9. RELIEVING/AGGRAVATING FACTORS: "What makes it better or worse?" (e.g., antacids, bending or twisting motion, bowel movement)     Ibuprofen, BC's 10. OTHER SYMPTOMS: "Do you have any other  symptoms?" (e.g., back pain, diarrhea, fever, urination pain, vomiting)       Back pain, throat, ears hurt, nausea, vomiting  Protocols used: Abdominal Pain - Female-A-AH

## 2023-10-12 NOTE — Telephone Encounter (Signed)
Routing to provider for referral

## 2023-10-12 NOTE — Telephone Encounter (Signed)
Referral placed.

## 2023-10-13 ENCOUNTER — Encounter: Payer: Self-pay | Admitting: Family Medicine

## 2023-10-13 ENCOUNTER — Ambulatory Visit: Payer: Medicaid Other | Admitting: Family Medicine

## 2023-10-13 VITALS — BP 126/86 | HR 116 | Wt 192.2 lb

## 2023-10-13 DIAGNOSIS — R300 Dysuria: Secondary | ICD-10-CM | POA: Diagnosis not present

## 2023-10-13 DIAGNOSIS — R7301 Impaired fasting glucose: Secondary | ICD-10-CM

## 2023-10-13 DIAGNOSIS — Z113 Encounter for screening for infections with a predominantly sexual mode of transmission: Secondary | ICD-10-CM

## 2023-10-13 DIAGNOSIS — N898 Other specified noninflammatory disorders of vagina: Secondary | ICD-10-CM | POA: Diagnosis not present

## 2023-10-13 DIAGNOSIS — R8281 Pyuria: Secondary | ICD-10-CM | POA: Diagnosis not present

## 2023-10-13 DIAGNOSIS — B9689 Other specified bacterial agents as the cause of diseases classified elsewhere: Secondary | ICD-10-CM

## 2023-10-13 DIAGNOSIS — N760 Acute vaginitis: Secondary | ICD-10-CM | POA: Diagnosis not present

## 2023-10-13 DIAGNOSIS — R1012 Left upper quadrant pain: Secondary | ICD-10-CM | POA: Diagnosis not present

## 2023-10-13 LAB — CBC
Hematocrit: 38.2 % (ref 34.0–46.6)
Hemoglobin: 13 g/dL (ref 11.1–15.9)
MCH: 29.5 pg (ref 26.6–33.0)
MCHC: 34 g/dL (ref 31.5–35.7)
MCV: 87 fL (ref 79–97)
Platelets: 390 10*3/uL (ref 150–450)
RBC: 4.4 x10E6/uL (ref 3.77–5.28)
RDW: 13.5 % (ref 11.7–15.4)
WBC: 10 10*3/uL (ref 3.4–10.8)

## 2023-10-13 LAB — URINALYSIS, ROUTINE W REFLEX MICROSCOPIC
Bilirubin, UA: NEGATIVE
Glucose, UA: NEGATIVE
Ketones, UA: NEGATIVE
Nitrite, UA: NEGATIVE
Protein,UA: NEGATIVE
Specific Gravity, UA: 1.02 (ref 1.005–1.030)
Urobilinogen, Ur: 0.2 mg/dL (ref 0.2–1.0)
pH, UA: 6.5 (ref 5.0–7.5)

## 2023-10-13 LAB — BAYER DCA HB A1C WAIVED: HB A1C (BAYER DCA - WAIVED): 5.3 % (ref 4.8–5.6)

## 2023-10-13 LAB — WET PREP FOR TRICH, YEAST, CLUE
Clue Cell Exam: POSITIVE — AB
Trichomonas Exam: NEGATIVE
Yeast Exam: NEGATIVE

## 2023-10-13 LAB — MICROSCOPIC EXAMINATION: Bacteria, UA: NONE SEEN

## 2023-10-13 MED ORDER — METRONIDAZOLE 1.3 % VA GEL: 1.0000 | Freq: Every day | VAGINAL | 0 refills | Status: AC

## 2023-10-13 MED ORDER — OMEPRAZOLE 20 MG PO CPDR: 20.0000 mg | DELAYED_RELEASE_CAPSULE | Freq: Every day | ORAL | 3 refills | Status: DC

## 2023-10-13 NOTE — Progress Notes (Signed)
BP 126/86   Pulse (!) 116   Wt 192 lb 3.2 oz (87.2 kg)   LMP 08/09/2023 (Approximate) Comment: tubal  SpO2 98%   BMI 34.05 kg/m    Subjective:    Patient ID: Briana Lyons, female    DOB: 1981-07-31, 42 y.o.   MRN: [id number]  HPI: Briana Lyons is a 42 y.o. female  Chief Complaint  Patient presents with   Abdominal Pain    Patient says it feels like she is hurting on her L side and says she was recently hospitalized for the issue. Patient says she thinks she has lingering issues with Pancreatitis. Patient says she would like to discuss with provider at today's visit.    Urinary Tract Infection    Patient says she thinks she has a UTI. Patient says she urinary urgency and burning with urination. Patient says she thinks she may have some bacterial going on, as she did not complete her previous course of treatment.    Vaginal Itching   ABDOMINAL PAIN  Duration: last week for about 3-4 days Onset: sudden Severity: severe Quality: throbbing Location:  LUQ   Episode duration:  Radiation: into the back Frequency: constant Alleviating factors: not eating Aggravating factors: eating Status: better Treatments attempted: cutting back on her eating, drinking a fluids Fever: yes 102 Nausea: yes Vomiting: yes Weight loss: no Decreased appetite: yes Diarrhea: no Constipation: yes Blood in stool: no Heartburn: yes Jaundice: no Rash: no Dysuria/urinary frequency: yes Hematuria: no History of sexually transmitted disease: no Recurrent NSAID use: no  URINARY SYMPTOMS Duration: last week Dysuria: no Urinary frequency: yes Urgency: yes Small volume voids: no Symptom severity: moderate Urinary incontinence: no Foul odor: no Hematuria: no Abdominal pain: yes Back pain: no Suprapubic pain/pressure: no Flank pain: no Fever:  yes Vomiting: yes Relief with cranberry juice: no Relief with pyridium: no Status: stable Previous urinary tract infection: yes Recurrent urinary  tract infection: yes vaginal discharge: yes Treatments attempted: increasing fluids   Relevant past medical, surgical, family and social history reviewed and updated as indicated. Interim medical history since our last visit reviewed. Allergies and medications reviewed and updated.  Review of Systems  Constitutional: Negative.   HENT: Negative.    Respiratory: Negative.    Cardiovascular: Negative.   Gastrointestinal:  Positive for abdominal distention, abdominal pain, constipation, nausea and vomiting. Negative for anal bleeding, blood in stool, diarrhea and rectal pain.  Genitourinary:  Positive for dysuria, frequency, urgency and vaginal discharge. Negative for decreased urine volume, difficulty urinating, dyspareunia, enuresis, flank pain, genital sores, hematuria, menstrual problem, pelvic pain, vaginal bleeding and vaginal pain.  Psychiatric/Behavioral: Negative.      Per HPI unless specifically indicated above     Objective:    BP 126/86   Pulse (!) 116   Wt 192 lb 3.2 oz (87.2 kg)   LMP 08/09/2023 (Approximate) Comment: tubal  SpO2 98%   BMI 34.05 kg/m   Wt Readings from Last 3 Encounters:  10/13/23 192 lb 3.2 oz (87.2 kg)  09/18/23 192 lb (87.1 kg)  09/01/23 193 lb 12.6 oz (87.9 kg)    Physical Exam Vitals and nursing note reviewed.  Constitutional:      General: She is not in acute distress.    Appearance: Normal appearance. She is well-developed. She is obese. She is not ill-appearing, toxic-appearing or diaphoretic.  HENT:     Head: Normocephalic and atraumatic.     Right Ear: External ear normal.     Left Ear:  External ear normal.     Nose: Nose normal.     Mouth/Throat:     Mouth: Mucous membranes are moist.     Pharynx: Oropharynx is clear.  Eyes:     General: No scleral icterus.       Right eye: No discharge.        Left eye: No discharge.     Extraocular Movements: Extraocular movements intact.     Conjunctiva/sclera: Conjunctivae normal.      Pupils: Pupils are equal, round, and reactive to light.  Cardiovascular:     Rate and Rhythm: Normal rate and regular rhythm.     Pulses: Normal pulses.     Heart sounds: Normal heart sounds. No murmur heard.    No friction rub. No gallop.  Pulmonary:     Effort: Pulmonary effort is normal. No respiratory distress.     Breath sounds: Normal breath sounds. No stridor. No wheezing, rhonchi or rales.  Chest:     Chest wall: No tenderness.  Abdominal:     General: Bowel sounds are normal.     Palpations: Abdomen is soft.     Tenderness: There is abdominal tenderness (mild in LUQ) in the left upper quadrant.  Musculoskeletal:        General: Normal range of motion.     Cervical back: Normal range of motion and neck supple.  Skin:    General: Skin is warm and dry.     Capillary Refill: Capillary refill takes less than 2 seconds.     Coloration: Skin is not jaundiced or pale.     Findings: No bruising, erythema, lesion or rash.  Neurological:     General: No focal deficit present.     Mental Status: She is alert and oriented to person, place, and time. Mental status is at baseline.  Psychiatric:        Mood and Affect: Mood normal.        Behavior: Behavior normal.        Thought Content: Thought content normal.        Judgment: Judgment normal.     Results for orders placed or performed in visit on 09/18/23  Novel Coronavirus, NAA ([facility])   Collection Time: 09/18/23  2:59 PM   Specimen: Nasopharyngeal(NP) swabs in vial transport medium  Result Value Ref Range   SARS-CoV-2, NAA Detected (A) Not Detected      Assessment & Plan:   Problem List Items Addressed This Visit       Other   Abdominal pain - Primary   Relevant Orders   Lipase   Amylase   Comp Met (CMET)   CBC   Other Visit Diagnoses       Vaginal discharge       + clue cells   Relevant Orders   WET PREP FOR TRICH, YEAST, CLUE     Dysuria       + pyuria and clus cells. Will treat for BV and check urine  culture.   Relevant Orders   Urinalysis, Routine w reflex microscopic     Screening examination for STI       Labs drawn today. Await results.   Relevant Orders   HSV 1 and 2 Ab, IgG   GC/Chlamydia Probe Amp   HIV Antibody (routine testing w rflx)   Acute Viral Hepatitis (HAV, HBV, HCV)   RPR w/reflex to TrepSure     BV (bacterial vaginosis)       Will treat  with vaginal flagyl. Call with any concerns.     Pyuria       Will check urine culture. Await results.   Relevant Orders   Urine Culture     IFG (impaired fasting glucose)       Will recheck her A1c. Await results.   Relevant Orders   Bayer DCA Hb A1c Waived        Follow up plan: Return in about 4 weeks (around 11/10/2023).

## 2023-10-15 LAB — RPR W/REFLEX TO TREPSURE

## 2023-10-15 LAB — URINE CULTURE

## 2023-10-16 ENCOUNTER — Other Ambulatory Visit: Payer: Self-pay | Admitting: Nurse Practitioner

## 2023-10-16 LAB — COMPREHENSIVE METABOLIC PANEL
ALT: 18 [IU]/L (ref 0–32)
AST: 16 [IU]/L (ref 0–40)
Albumin: 4.7 g/dL (ref 3.9–4.9)
Alkaline Phosphatase: 98 [IU]/L (ref 44–121)
BUN/Creatinine Ratio: 13 (ref 9–23)
BUN: 12 mg/dL (ref 6–24)
Bilirubin Total: <0.2 mg/dL (ref 0.0–1.2)
CO2: 24 mmol/L (ref 20–29)
Calcium: 9.6 mg/dL (ref 8.7–10.2)
Chloride: 103 mmol/L (ref 96–106)
Creatinine, Ser: 0.89 mg/dL (ref 0.57–1.00)
Globulin, Total: 2.4 g/dL (ref 1.5–4.5)
Glucose: 90 mg/dL (ref 70–99)
Potassium: 4.2 mmol/L (ref 3.5–5.2)
Sodium: 143 mmol/L (ref 134–144)
Total Protein: 7.1 g/dL (ref 6.0–8.5)
eGFR: 83 mL/min/{1.73_m2} (ref >59)

## 2023-10-16 LAB — HCV INTERPRETATION

## 2023-10-16 LAB — TREPONEMAL ANTIBODIES, TPPA: Treponemal Antibodies, TPPA: NONREACTIVE

## 2023-10-16 LAB — GC/CHLAMYDIA PROBE AMP
Chlamydia trachomatis, NAA: NEGATIVE
Neisseria Gonorrhoeae by PCR: NEGATIVE

## 2023-10-16 LAB — RPR W/REFLEX TO TREPSURE

## 2023-10-16 LAB — HSV 1 AND 2 AB, IGG
HSV 1 Glycoprotein G Ab, IgG: REACTIVE — AB
HSV 2 IgG, Type Spec: REACTIVE — AB

## 2023-10-16 LAB — ACUTE VIRAL HEPATITIS (HAV, HBV, HCV)
HCV Ab: NONREACTIVE
Hep A IgM: NEGATIVE
Hep B C IgM: NEGATIVE
Hepatitis B Surface Ag: NEGATIVE

## 2023-10-16 LAB — LIPASE: Lipase: 30 U/L (ref 14–72)

## 2023-10-16 LAB — AMYLASE: Amylase: 37 U/L (ref 31–110)

## 2023-10-16 LAB — HIV ANTIBODY (ROUTINE TESTING W REFLEX): HIV Screen 4th Generation wRfx: NONREACTIVE

## 2023-10-17 NOTE — Telephone Encounter (Signed)
Refused Mobic 15 mg because it was discontinued 10/13/2023.

## 2023-11-07 ENCOUNTER — Telehealth: Payer: Self-pay | Admitting: Nurse Practitioner

## 2023-11-07 NOTE — Telephone Encounter (Signed)
 Patient states she never finished her BV treatment- patient states she had to go to hospital for more important illness. Patient states she has trouble eating and would like to know if she can get vaginal treatment instead of oral treatment for the BV.

## 2023-11-07 NOTE — Telephone Encounter (Signed)
 Medication Refill -  Most Recent Primary Care Visit:  Provider: VICCI DUWAINE SQUIBB  Department: [facility]  Visit Type: OFFICE VISIT  Date: 10/13/2023  Medication: metroNIDAZOLE  (FLAGYL ) 500 MG tablet   Has the patient contacted their pharmacy? No  Is this the correct pharmacy for this prescription? Yes If no, delete pharmacy and type the correct one.  This is the patient's preferred pharmacy:  [id number] - [place], [place] - [address] [place] [address] Phone: [phone] Fax: [phone]   Has the prescription been filled recently? Yes  Is the patient out of the medication? Yes  Has the patient been seen for an appointment in the last year OR does the patient have an upcoming appointment? Yes  Can we respond through MyChart? No  Agent: Please be advised that Rx refills may take up to 3 business days. We ask that you follow-up with your pharmacy.  Medication not on patient current med list

## 2023-11-08 NOTE — Telephone Encounter (Signed)
 Tried to return call to patient to discuss and she was alseep, [facility] for her to return our call. Upon chart review Metronidazole  1.3 gel was prescribed on 12/20/224 at her last visit with Dr. Lincoln Renshaw. If she didn't pick this medication up she should contact the pharmacy first since this is topical or not oral as she requests. If anything else she should be scheduled for a follow up visit to discuss updated plan.

## 2023-11-15 ENCOUNTER — Ambulatory Visit: Payer: Medicaid Other | Admitting: Pediatrics

## 2023-11-22 ENCOUNTER — Ambulatory Visit: Payer: Medicaid Other | Admitting: Pediatrics

## 2023-11-22 VITALS — BP 138/86 | HR 118 | Temp 98.7°F | Resp 17 | Wt 190.2 lb

## 2023-11-22 DIAGNOSIS — Z113 Encounter for screening for infections with a predominantly sexual mode of transmission: Secondary | ICD-10-CM

## 2023-11-22 DIAGNOSIS — B002 Herpesviral gingivostomatitis and pharyngotonsillitis: Secondary | ICD-10-CM | POA: Diagnosis not present

## 2023-11-22 DIAGNOSIS — Z8719 Personal history of other diseases of the digestive system: Secondary | ICD-10-CM

## 2023-11-22 DIAGNOSIS — F17210 Nicotine dependence, cigarettes, uncomplicated: Secondary | ICD-10-CM

## 2023-11-22 DIAGNOSIS — R109 Unspecified abdominal pain: Secondary | ICD-10-CM

## 2023-11-22 DIAGNOSIS — R399 Unspecified symptoms and signs involving the genitourinary system: Secondary | ICD-10-CM

## 2023-11-22 DIAGNOSIS — N949 Unspecified condition associated with female genital organs and menstrual cycle: Secondary | ICD-10-CM

## 2023-11-22 DIAGNOSIS — Z131 Encounter for screening for diabetes mellitus: Secondary | ICD-10-CM

## 2023-11-22 DIAGNOSIS — K219 Gastro-esophageal reflux disease without esophagitis: Secondary | ICD-10-CM

## 2023-11-22 DIAGNOSIS — Z13220 Encounter for screening for lipoid disorders: Secondary | ICD-10-CM

## 2023-11-22 LAB — URINALYSIS, ROUTINE W REFLEX MICROSCOPIC
Bilirubin, UA: NEGATIVE
Glucose, UA: NEGATIVE
Ketones, UA: NEGATIVE
Leukocytes,UA: NEGATIVE
Nitrite, UA: NEGATIVE
Protein,UA: NEGATIVE
Specific Gravity, UA: 1.025 (ref 1.005–1.030)
Urobilinogen, Ur: 0.2 mg/dL (ref 0.2–1.0)
pH, UA: 6 (ref 5.0–7.5)

## 2023-11-22 LAB — WET PREP FOR TRICH, YEAST, CLUE
Clue Cell Exam: POSITIVE — AB
Trichomonas Exam: NEGATIVE
Yeast Exam: POSITIVE — AB

## 2023-11-22 LAB — MICROSCOPIC EXAMINATION: WBC, UA: NONE SEEN /[HPF] (ref 0–5)

## 2023-11-22 MED ORDER — OMEPRAZOLE 40 MG PO CPDR: 40.0000 mg | DELAYED_RELEASE_CAPSULE | Freq: Two times a day (BID) | ORAL | 0 refills | Status: DC

## 2023-11-22 MED ORDER — VALACYCLOVIR HCL 1 G PO TABS: 1000.0000 mg | ORAL_TABLET | Freq: Two times a day (BID) | ORAL | 0 refills | Status: AC

## 2023-11-22 MED ORDER — MELOXICAM 7.5 MG PO TABS: 7.5000 mg | ORAL_TABLET | Freq: Every day | ORAL | 0 refills | Status: DC

## 2023-11-22 NOTE — Patient Instructions (Addendum)
There's no standard diet that people should follow after gallbladder removal surgery. In general, it's best to avoid fatty, greasy, processed, and sugary foods.  Please follow up with your PCP in 1 month.

## 2023-11-22 NOTE — Progress Notes (Unsigned)
Office Visit  BP 138/86 (BP Location: Left Arm, Patient Position: Sitting, Cuff Size: Large)   Pulse (!) 118   Temp 98.7 F (37.1 C) (Oral)   Resp 17   Wt 190 lb 3.2 oz (86.3 kg)   SpO2 98%   BMI 33.69 kg/m    Subjective:    Patient ID: Briana Lyons, female    DOB: 08/01/1981, 43 y.o.   MRN: [id number]  HPI: Briana Lyons is a 43 y.o. female  Chief Complaint  Patient presents with  . BV    Didn't get but one applicator. Would like to reswab.   Marland Kitchen URI    Throwing up, sore throat (burns). Ongoing for a week.    Discussed the use of AI scribe software for clinical note transcription with the patient, who gave verbal consent to proceed.  History of Present Illness   The patient presents with persistent nausea, vomiting, and acid reflux.  She has been experiencing persistent nausea, vomiting, and acid reflux for the past week. Despite being on omeprazole, symptoms persist, including daily vomiting and acid causing mouth irritation.  She had her gallbladder removed in November after a hospitalization for a swollen pancreas, which was thought to be related to gallbladder issues, possibly gallstones. Despite the surgery, she continues to feel unwell and is attempting to maintain a healthy diet.  She experiences back pain and has been using meloxicam effectively for relief without side effects. However, her grandmother mistakenly disposed of her medication.  She is concerned about potential kidney stones and is interested in further diagnostic imaging, such as an x-ray and ultrasound, to investigate this possibility.  She experiences panic attacks, with a severe one occurring yesterday. She uses a CBD pen to manage anxiety, which helps her feel calm. She also mentions vaping as a method to reduce smoking.  There is a family history of hepatitis C, as both parents had the condition. She is concerned about potential transmission through shared razors and is interested in getting tested  for hepatitis C.     Relevant past medical, surgical, family and social history reviewed and updated as indicated. Interim medical history since our last visit reviewed. Allergies and medications reviewed and updated.  ROS per HPI unless specifically indicated above     Objective:    BP 138/86 (BP Location: Left Arm, Patient Position: Sitting, Cuff Size: Large)   Pulse (!) 118   Temp 98.7 F (37.1 C) (Oral)   Resp 17   Wt 190 lb 3.2 oz (86.3 kg)   SpO2 98%   BMI 33.69 kg/m   Wt Readings from Last 3 Encounters:  11/22/23 190 lb 3.2 oz (86.3 kg)  10/13/23 192 lb 3.2 oz (87.2 kg)  09/18/23 192 lb (87.1 kg)     Physical Exam Constitutional:      Appearance: Normal appearance.  Pulmonary:     Effort: Pulmonary effort is normal.  Abdominal:     General: Abdomen is flat. There is no distension.     Palpations: Abdomen is soft.     Tenderness: There is no abdominal tenderness. There is no guarding or rebound.  Musculoskeletal:        General: Normal range of motion.  Skin:    Comments: Normal skin color  Neurological:     General: No focal deficit present.     Mental Status: She is alert. Mental status is at baseline.  Psychiatric:        Mood and Affect: Mood normal.  Behavior: Behavior normal.        Thought Content: Thought content normal.        10/13/2023    2:33 PM 10/13/2023    1:44 PM 08/18/2023    3:38 PM 05/22/2023   10:56 AM 03/10/2023    9:13 AM  Depression screen PHQ 2/9  Decreased Interest 1 0 0 1 3  Down, Depressed, Hopeless 1 0 0 1 3  PHQ - 2 Score 2 0 0 2 6  Altered sleeping 2 0 0 0 3  Tired, decreased energy 2 0 0 1 3  Change in appetite 2 0 0 1 3  Feeling bad or failure about yourself  1 0 0 2 3  Trouble concentrating 2 0 0 2 3  Moving slowly or fidgety/restless 1 0 0 1 2  Suicidal thoughts 1 0 0 0 0  PHQ-9 Score 13 0 0 9 23  Difficult doing work/chores Somewhat difficult Not difficult at all Not difficult at all Somewhat difficult  Extremely dIfficult       10/13/2023    2:34 PM 10/13/2023    1:45 PM 10/13/2023    1:44 PM 05/22/2023   10:56 AM  GAD 7 : Generalized Anxiety Score  Nervous, Anxious, on Edge 0 0 0 2  Control/stop worrying 0 0 0 2  Worry too much - different things 0 0 0 2  Trouble relaxing 2 0 0 2  Restless 1 0 0 1  Easily annoyed or irritable 1 0 0 2  Afraid - awful might happen 0 0 0 1  Total GAD 7 Score 4 0 0 12  Anxiety Difficulty Somewhat difficult Not difficult at all Not difficult at all Somewhat difficult       Assessment & Plan:  Assessment & Plan   Vaginal discomfort -     WET PREP FOR TRICH, YEAST, CLUE  Urinary tract infection symptoms -     Urinalysis, Routine w reflex microscopic -     Urine Culture  Diabetes mellitus screening  Lipid screening  Gastroesophageal reflux disease, unspecified whether esophagitis present -     Omeprazole; Take 1 capsule (40 mg total) by mouth 2 (two) times daily before a meal.  Dispense: 180 capsule; Refill: 0  Abdominal pain, unspecified abdominal location -     Lipase -     Comprehensive metabolic panel -     CBC -     Ambulatory referral to Gastroenterology -     Meloxicam; Take 1 tablet (7.5 mg total) by mouth daily.  Dispense: 30 tablet; Refill: 0  Screen for STD (sexually transmitted disease) -     HIV Antibody (routine testing w rflx) -     RPR w/reflex to TrepSure -     Acute Viral Hepatitis (HAV, HBV, HCV) -     Chlamydia/Gonococcus/Trichomonas, NAA  History of pancreatitis -     Ambulatory referral to Gastroenterology  Oral herpes -     valACYclovir HCl; Take 1 tablet (1,000 mg total) by mouth 2 (two) times daily for 10 days.  Dispense: 20 tablet; Refill: 0  Other orders -     Microscopic Examination -     WET PREP FOR TRICH, YEAST, CLUE -     Interpretation:     Assessment and Plan    Gastroesophageal Reflux Disease (GERD) Daily vomiting and acid reflux for a week. History of gallbladder removal and  pancreatitis. Currently on Omeprazole. -Increase Omeprazole dose. -Advise to take Omeprazole 20-30 minutes before  meals. -Order labs and tests including swabs and urine. -Check electrolytes and kidney function. -Refer to [facility] for further evaluation.  Bipolar Disorder Patient currently not on any medication for bipolar disorder. Reports feeling like a "zombie" on previous medications. -Plan to manage bipolar disorder. Request patient to bring all previous medications for review. -Start Buspar 5mg  at dinnertime. Follow up in two weeks to assess response and adjust dose as needed.  Smoking Patient currently smoking half a pack of [place] cigarettes daily. Reports using a vape more frequently. -Encourage smoking cessation. Discuss potential strategies including nicotine replacement therapy.  Abdominal Pain Patient reports back pain. Currently using Meloxicam for pain relief. Cautioned about potential for stomach ulcers due to NSAID use. -Continue Meloxicam with caution. Monitor for signs of stomach ulcers.  General Health Maintenance -Check for Hepatitis C due to family history. -Provide patient with gallbladder diet and high protein food list. -Encourage increased water intake and healthier lifestyle choices. -Plan to see patient in June for follow-up.         Follow up plan: Return if symptoms worsen or fail to improve.  Caileigh Canche Howell Pringle, MD

## 2023-11-24 ENCOUNTER — Other Ambulatory Visit: Payer: Self-pay | Admitting: Pediatrics

## 2023-11-24 DIAGNOSIS — B379 Candidiasis, unspecified: Secondary | ICD-10-CM

## 2023-11-24 DIAGNOSIS — N760 Acute vaginitis: Secondary | ICD-10-CM

## 2023-11-24 MED ORDER — FLUCONAZOLE 150 MG PO TABS: 150.0000 mg | ORAL_TABLET | Freq: Once | ORAL | 0 refills | Status: AC

## 2023-11-24 MED ORDER — METRONIDAZOLE 500 MG PO TABS: 500.0000 mg | ORAL_TABLET | Freq: Two times a day (BID) | ORAL | 0 refills | Status: AC

## 2023-11-25 LAB — CHLAMYDIA/GONOCOCCUS/TRICHOMONAS, NAA
Chlamydia by NAA: NEGATIVE
Gonococcus by NAA: NEGATIVE
Trich vag by NAA: NEGATIVE

## 2023-11-28 ENCOUNTER — Encounter: Payer: Self-pay | Admitting: Pediatrics

## 2023-11-28 DIAGNOSIS — F17210 Nicotine dependence, cigarettes, uncomplicated: Secondary | ICD-10-CM | POA: Insufficient documentation

## 2023-11-28 NOTE — Assessment & Plan Note (Signed)
Discussed cessation and available treatment including nicotine replacement options, pharmacologic treatment, and/or online resources. Based on our discussion, she does not plan to initiate treatment today. Total time spent on discussion: 3 minutes.

## 2023-11-28 NOTE — Assessment & Plan Note (Signed)
 Daily vomiting and acid reflux for a week. History of gallbladder removal and pancreatitis during most recent admission. Patient reports back pain. Currently using Meloxicam  for pain relief . Cautioned about potential for stomach ulcers due to NSAID use. Currently on Omeprazole .  Would like to see [facility] for second opinion. Abdominal exam unremarkable today. -Increase Omeprazole  dose. -Minimize nsaid use -Advise to take Omeprazole  20-30 minutes before meals. -Order labs and tests including swabs and urine. -Check electrolytes and kidney function.

## 2023-11-29 LAB — TREPONEMAL ANTIBODIES, TPPA: Treponemal Antibodies, TPPA: REACTIVE — AB

## 2023-11-29 LAB — ACUTE VIRAL HEPATITIS (HAV, HBV, HCV)
HCV Ab: NONREACTIVE
Hep A IgM: NEGATIVE
Hep B C IgM: NEGATIVE
Hepatitis B Surface Ag: NEGATIVE

## 2023-11-29 LAB — COMPREHENSIVE METABOLIC PANEL
ALT: 16 [IU]/L (ref 0–32)
AST: 12 [IU]/L (ref 0–40)
Albumin: 4.5 g/dL (ref 3.9–4.9)
Alkaline Phosphatase: 95 [IU]/L (ref 44–121)
BUN/Creatinine Ratio: 14 (ref 9–23)
BUN: 12 mg/dL (ref 6–24)
Bilirubin Total: <0.2 mg/dL (ref 0.0–1.2)
CO2: 23 mmol/L (ref 20–29)
Calcium: 9.5 mg/dL (ref 8.7–10.2)
Chloride: 103 mmol/L (ref 96–106)
Creatinine, Ser: 0.85 mg/dL (ref 0.57–1.00)
Globulin, Total: 2.6 g/dL (ref 1.5–4.5)
Glucose: 78 mg/dL (ref 70–99)
Potassium: 3.9 mmol/L (ref 3.5–5.2)
Sodium: 141 mmol/L (ref 134–144)
Total Protein: 7.1 g/dL (ref 6.0–8.5)
eGFR: 88 mL/min/{1.73_m2} (ref >59)

## 2023-11-29 LAB — CBC
Hematocrit: 35.9 % (ref 34.0–46.6)
Hemoglobin: 12 g/dL (ref 11.1–15.9)
MCH: 29.1 pg (ref 26.6–33.0)
MCHC: 33.4 g/dL (ref 31.5–35.7)
MCV: 87 fL (ref 79–97)
Platelets: 427 10*3/uL (ref 150–450)
RBC: 4.13 x10E6/uL (ref 3.77–5.28)
RDW: 13.7 % (ref 11.7–15.4)
WBC: 7.8 10*3/uL (ref 3.4–10.8)

## 2023-11-29 LAB — RPR W/REFLEX TO TREPSURE: RPR: NONREACTIVE

## 2023-11-29 LAB — HIV ANTIBODY (ROUTINE TESTING W REFLEX): HIV Screen 4th Generation wRfx: NONREACTIVE

## 2023-11-29 LAB — HCV INTERPRETATION

## 2023-11-29 LAB — LIPASE: Lipase: 41 U/L (ref 14–72)

## 2023-12-11 ENCOUNTER — Encounter: Payer: Self-pay | Admitting: Nurse Practitioner

## 2023-12-11 ENCOUNTER — Ambulatory Visit: Payer: Medicaid Other | Admitting: Nurse Practitioner

## 2023-12-11 VITALS — BP 130/91 | HR 105 | Temp 98.0°F | Ht 63.0 in | Wt 188.0 lb

## 2023-12-11 DIAGNOSIS — N898 Other specified noninflammatory disorders of vagina: Secondary | ICD-10-CM

## 2023-12-11 DIAGNOSIS — L0292 Furuncle, unspecified: Secondary | ICD-10-CM | POA: Diagnosis not present

## 2023-12-11 LAB — WET PREP FOR TRICH, YEAST, CLUE
Clue Cell Exam: NEGATIVE
Trichomonas Exam: NEGATIVE
Yeast Exam: NEGATIVE

## 2023-12-11 MED ORDER — IBUPROFEN 800 MG PO TABS: 800.0000 mg | ORAL_TABLET | Freq: Three times a day (TID) | ORAL | 0 refills | Status: DC | PRN

## 2023-12-11 MED ORDER — SULFAMETHOXAZOLE-TRIMETHOPRIM 800-160 MG PO TABS: 1.0000 | ORAL_TABLET | Freq: Two times a day (BID) | ORAL | 0 refills | Status: DC

## 2023-12-11 NOTE — Progress Notes (Signed)
 BP (!) 130/91 (BP Location: Left Arm, Patient Position: Sitting)   Pulse (!) 105   Temp 98 F (36.7 C)   Ht 5\' 3"  (1.6 m)   Wt 188 lb (85.3 kg)   SpO2 97%   BMI 33.30 kg/m    Subjective:    Patient ID: Briana Lyons, female    DOB: Jul 10, 1981, 43 y.o.   MRN: [id number]  HPI: Briana Lyons is a 43 y.o. female  Chief Complaint  Patient presents with   Recurrent Skin Infections    Lower back boil--redness, sore, drainage green/yellow-1 week. Tried to pop-it, peroxide, ointment, and keep dry.   BOIL Patient states she has a boil on her back that has been there for about a week.  Denies any fever.  She is in pain in her right buttocks.  She has noticed some drainage, yellow/green.  She has used lidocaine and meloxicam.   Patient states she is having vaginal itching and foul odor.     Relevant past medical, surgical, family and social history reviewed and updated as indicated. Interim medical history since our last visit reviewed. Allergies and medications reviewed and updated.  Review of Systems  Genitourinary:        Vaginal odor and itching    Per HPI unless specifically indicated above     Objective:    BP (!) 130/91 (BP Location: Left Arm, Patient Position: Sitting)   Pulse (!) 105   Temp 98 F (36.7 C)   Ht 5\' 3"  (1.6 m)   Wt 188 lb (85.3 kg)   SpO2 97%   BMI 33.30 kg/m   Wt Readings from Last 3 Encounters:  12/11/23 188 lb (85.3 kg)  11/22/23 190 lb 3.2 oz (86.3 kg)  10/13/23 192 lb 3.2 oz (87.2 kg)    Physical Exam Vitals and nursing note reviewed.  Constitutional:      General: She is not in acute distress.    Appearance: Normal appearance. She is normal weight. She is not ill-appearing, toxic-appearing or diaphoretic.  HENT:     Head: Normocephalic.     Right Ear: External ear normal.     Left Ear: External ear normal.     Nose: Nose normal.     Mouth/Throat:     Mouth: Mucous membranes are moist.     Pharynx: Oropharynx is clear.  Eyes:      General:        Right eye: No discharge.        Left eye: No discharge.     Extraocular Movements: Extraocular movements intact.     Conjunctiva/sclera: Conjunctivae normal.     Pupils: Pupils are equal, round, and reactive to light.  Cardiovascular:     Rate and Rhythm: Normal rate and regular rhythm.     Heart sounds: No murmur heard. Pulmonary:     Effort: Pulmonary effort is normal. No respiratory distress.     Breath sounds: Normal breath sounds. No wheezing or rales.  Musculoskeletal:     Cervical back: Normal range of motion and neck supple.  Skin:    General: Skin is warm and dry.     Capillary Refill: Capillary refill takes less than 2 seconds.       Neurological:     General: No focal deficit present.     Mental Status: She is alert and oriented to person, place, and time. Mental status is at baseline.  Psychiatric:        Mood and Affect: Mood  normal.        Behavior: Behavior normal.        Thought Content: Thought content normal.        Judgment: Judgment normal.     Results for orders placed or performed in visit on 12/02/23  Microscopic Examination   Collection Time: 12-02-2023  3:15 PM  Result Value Ref Range   WBC, UA None seen 0 - 5 /hpf   RBC, Urine 3-10 (A) 0 - 2 /hpf   Epithelial Cells (non renal) 0-10 0 - 10 /hpf   Bacteria, UA Few (A) None seen/Few  WET PREP FOR TRICH, YEAST, CLUE   Collection Time: 12/02/23  3:15 PM   Urine  Result Value Ref Range   Trichomonas Exam Negative Negative   Yeast Exam Positive (A) Negative   Clue Cell Exam Positive (A) Negative  Urinalysis, Routine w reflex microscopic   Collection Time: 2023-12-02  3:15 PM  Result Value Ref Range   Specific Gravity, UA 1.025 1.005 - 1.030   pH, UA 6.0 5.0 - 7.5   Color, UA Yellow Yellow   Appearance Ur Clear Clear   Leukocytes,UA Negative Negative   Protein,UA Negative Negative/Trace   Glucose, UA Negative Negative   Ketones, UA Negative Negative   RBC, UA 2+ (A) Negative    Bilirubin, UA Negative Negative   Urobilinogen, Ur 0.2 0.2 - 1.0 mg/dL   Nitrite, UA Negative Negative   Microscopic Examination See below:   Chlamydia/Gonococcus/Trichomonas, NAA([facility])   Collection Time: 12-02-23  3:16 PM   Specimen: Urine   UR  Result Value Ref Range   Chlamydia by NAA Negative Negative   Gonococcus by NAA Negative Negative   Trich vag by NAA Negative Negative  Treponemal Antibodies, TPPA   Collection Time: 12/02/23  3:22 PM  Result Value Ref Range   Treponemal Antibodies, TPPA Reactive (A) Non Reactive   Interpretation: Comment   Lipase   Collection Time: 2023/12/02  3:22 PM  Result Value Ref Range   Lipase 41 14 - 72 U/L  Comp Met (CMET)   Collection Time: Dec 02, 2023  3:22 PM  Result Value Ref Range   Glucose 78 70 - 99 mg/dL   BUN 12 6 - 24 mg/dL   Creatinine, Ser 1.61 0.57 - 1.00 mg/dL   eGFR 88 >09 UE/AVW/0.98   BUN/Creatinine Ratio 14 9 - 23   Sodium 141 134 - 144 mmol/L   Potassium 3.9 3.5 - 5.2 mmol/L   Chloride 103 96 - 106 mmol/L   CO2 23 20 - 29 mmol/L   Calcium 9.5 8.7 - 10.2 mg/dL   Total Protein 7.1 6.0 - 8.5 g/dL   Albumin 4.5 3.9 - 4.9 g/dL   Globulin, Total 2.6 1.5 - 4.5 g/dL   Bilirubin Total <1.1 0.0 - 1.2 mg/dL   Alkaline Phosphatase 95 44 - 121 IU/L   AST 12 0 - 40 IU/L   ALT 16 0 - 32 IU/L  HIV antibody (with reflex)   Collection Time: 12/02/23  3:22 PM  Result Value Ref Range   HIV Screen 4th Generation wRfx Non Reactive Non Reactive  RPR w/reflex to TrepSure   Collection Time: December 02, 2023  3:22 PM  Result Value Ref Range   RPR Non Reactive Non Reactive  Acute Viral Hepatitis (HAV, HBV, HCV)   Collection Time: 2023/12/02  3:22 PM  Result Value Ref Range   Hep A IgM Negative Negative   Hepatitis B Surface Ag Negative Negative   Hep B  C IgM Negative Negative   HCV Ab Non Reactive Non Reactive  CBC   Collection Time: 11/22/23  3:22 PM  Result Value Ref Range   WBC 7.8 3.4 - 10.8 x10E3/uL   RBC 4.13 3.77 - 5.28 x10E6/uL    Hemoglobin 12.0 11.1 - 15.9 g/dL   Hematocrit 53.6 64.4 - 46.6 %   MCV 87 79 - 97 fL   MCH 29.1 26.6 - 33.0 pg   MCHC 33.4 31.5 - 35.7 g/dL   RDW 03.4 74.2 - 59.5 %   Platelets 427 150 - 450 x10E3/uL  Interpretation:   Collection Time: 11/22/23  3:22 PM  Result Value Ref Range   HCV Interp 1: Comment       Assessment & Plan:   Problem List Items Addressed This Visit   None Visit Diagnoses       Boil    -  Primary   Will treat with bactrim. Already draining purulent drainage. Complete course of antibiotics.  FOllow up if not improved.   Relevant Medications   sulfamethoxazole-trimethoprim (BACTRIM DS) 800-160 MG tablet     Vaginal itching       Will treat based on results.   Relevant Orders   WET PREP FOR TRICH, YEAST, CLUE   HIV Antibody (routine testing w rflx)   RPR   Hepatitis C antibody   Chlamydia/Gonococcus/Trichomonas, NAA        Follow up plan: Return in about 1 week (around 12/18/2023) for Boil.

## 2023-12-12 ENCOUNTER — Encounter: Payer: Self-pay | Admitting: Nurse Practitioner

## 2023-12-12 LAB — RPR: RPR Ser Ql: NONREACTIVE

## 2023-12-12 LAB — HIV ANTIBODY (ROUTINE TESTING W REFLEX): HIV Screen 4th Generation wRfx: NONREACTIVE

## 2023-12-12 LAB — HEPATITIS C ANTIBODY: Hep C Virus Ab: NONREACTIVE

## 2023-12-13 LAB — CHLAMYDIA/GONOCOCCUS/TRICHOMONAS, NAA
Chlamydia by NAA: NEGATIVE
Gonococcus by NAA: NEGATIVE
Trich vag by NAA: NEGATIVE

## 2023-12-18 ENCOUNTER — Ambulatory Visit: Payer: Self-pay | Admitting: Nurse Practitioner

## 2023-12-19 ENCOUNTER — Other Ambulatory Visit: Payer: Self-pay | Admitting: Pediatrics

## 2023-12-19 DIAGNOSIS — R109 Unspecified abdominal pain: Secondary | ICD-10-CM

## 2023-12-20 NOTE — Telephone Encounter (Signed)
 Requested medication (s) are due for refill today: Yes  Requested medication (s) are on the active medication list: Yes  Last refill:  11/22/23  Future visit scheduled: Yes  Notes to clinic:  Manual review.    Requested Prescriptions  Pending Prescriptions Disp Refills   meloxicam (MOBIC) 7.5 MG tablet [Pharmacy Med Name: MELOXICAM 7.5 MG TABLET] 30 tablet 0    Sig: TAKE 1 TABLET BY MOUTH EVERY DAY     Analgesics:  COX2 Inhibitors Failed - 12/20/2023  7:45 AM      Failed - Manual Review: Labs are only required if the patient has taken medication for more than 8 weeks.      Passed - HGB in normal range and within 360 days    Hemoglobin  Date Value Ref Range Status  11/22/2023 12.0 11.1 - 15.9 g/dL Final         Passed - Cr in normal range and within 360 days    Creatinine  Date Value Ref Range Status  07/03/2014 1.02 0.60 - 1.30 mg/dL Final   Creatinine, Ser  Date Value Ref Range Status  11/22/2023 0.85 0.57 - 1.00 mg/dL Final         Passed - HCT in normal range and within 360 days    Hematocrit  Date Value Ref Range Status  11/22/2023 35.9 34.0 - 46.6 % Final         Passed - AST in normal range and within 360 days    AST  Date Value Ref Range Status  11/22/2023 12 0 - 40 IU/L Final   SGOT(AST)  Date Value Ref Range Status  07/03/2014 17 15 - 37 Unit/L Final         Passed - ALT in normal range and within 360 days    ALT  Date Value Ref Range Status  11/22/2023 16 0 - 32 IU/L Final   SGPT (ALT)  Date Value Ref Range Status  07/03/2014 19 U/L Final    Comment:    14-63 NOTE: New Reference Range 05/13/14          Passed - eGFR is 30 or above and within 360 days    EGFR (African American)  Date Value Ref Range Status  07/03/2014 >60  Final   GFR calc Af Amer  Date Value Ref Range Status  04/30/2020 >60 >60 mL/min Final   EGFR (Non-African Amer.)  Date Value Ref Range Status  07/03/2014 >60  Final    Comment:    eGFR values <27mL/min/1.73 m2  may be an indication of chronic kidney disease (CKD). Calculated eGFR is useful in patients with stable renal function. The eGFR calculation will not be reliable in acutely ill patients when serum creatinine is changing rapidly. It is not useful in  patients on dialysis. The eGFR calculation may not be applicable to patients at the low and high extremes of body sizes, pregnant women, and vegetarians.    GFR, Estimated  Date Value Ref Range Status  09/04/2023 >60 >60 mL/min Final    Comment:    (NOTE) Calculated using the CKD-EPI Creatinine Equation (2021)    eGFR  Date Value Ref Range Status  11/22/2023 88 >59 mL/min/1.73 Final         Passed - Patient is not pregnant      Passed - Valid encounter within last 12 months    Recent Outpatient Visits           4 weeks ago Gastroesophageal reflux disease,  unspecified whether esophagitis present   [place] [facility] Jackolyn Confer, MD   2 months ago Left upper quadrant abdominal pain   [place] [facility] [place], Megan P, DO   3 months ago Depression, major, single episode, severe [facility])   [place] [facility] Larae Grooms, NP   4 months ago Vaginal pain   [place] [facility] Mecum, Oswaldo Conroy, PA-C   7 months ago Abscess of left breast   [place] [facility] Mecum, Oswaldo Conroy, PA-C       Future Appointments             Tomorrow Larae Grooms, NP [place] [facility] [place]

## 2023-12-21 ENCOUNTER — Ambulatory Visit: Payer: Medicaid Other | Admitting: Nurse Practitioner

## 2024-01-01 ENCOUNTER — Other Ambulatory Visit: Payer: Self-pay

## 2024-01-01 ENCOUNTER — Emergency Department
Admission: EM | Admit: 2024-01-01 | Discharge: 2024-01-01 | Disposition: A | Discharge status: Home / Self Care | Attending: Emergency Medicine | Admitting: Emergency Medicine

## 2024-01-01 DIAGNOSIS — N760 Acute vaginitis: Secondary | ICD-10-CM | POA: Insufficient documentation

## 2024-01-01 DIAGNOSIS — N898 Other specified noninflammatory disorders of vagina: Secondary | ICD-10-CM | POA: Diagnosis present

## 2024-01-01 DIAGNOSIS — B9689 Other specified bacterial agents as the cause of diseases classified elsewhere: Secondary | ICD-10-CM | POA: Diagnosis not present

## 2024-01-01 LAB — CBC
HCT: 40.9 % (ref 36.0–46.0)
Hemoglobin: 13.4 g/dL (ref 12.0–15.0)
MCH: 29.1 pg (ref 26.0–34.0)
MCHC: 32.8 g/dL (ref 30.0–36.0)
MCV: 88.7 fL (ref 80.0–100.0)
Platelets: 383 10*3/uL (ref 150–400)
RBC: 4.61 MIL/uL (ref 3.87–5.11)
RDW: 14.3 % (ref 11.5–15.5)
WBC: 6.8 10*3/uL (ref 4.0–10.5)
nRBC: 0 % (ref 0.0–0.2)

## 2024-01-01 LAB — URINALYSIS, ROUTINE W REFLEX MICROSCOPIC
Bilirubin Urine: NEGATIVE
Glucose, UA: NEGATIVE mg/dL
Ketones, ur: NEGATIVE mg/dL
Leukocytes,Ua: NEGATIVE
Nitrite: NEGATIVE
Protein, ur: NEGATIVE mg/dL
Specific Gravity, Urine: 1.005 (ref 1.005–1.030)
pH: 6 (ref 5.0–8.0)

## 2024-01-01 LAB — COMPREHENSIVE METABOLIC PANEL
ALT: 22 U/L (ref 0–44)
AST: 25 U/L (ref 15–41)
Albumin: 4 g/dL (ref 3.5–5.0)
Alkaline Phosphatase: 64 U/L (ref 38–126)
Anion gap: 11 (ref 5–15)
BUN: 11 mg/dL (ref 6–20)
CO2: 23 mmol/L (ref 22–32)
Calcium: 9.3 mg/dL (ref 8.9–10.3)
Chloride: 103 mmol/L (ref 98–111)
Creatinine, Ser: 0.78 mg/dL (ref 0.44–1.00)
GFR, Estimated: >60 mL/min (ref >60)
Glucose, Bld: 116 mg/dL — ABNORMAL HIGH (ref 70–99)
Potassium: 3.4 mmol/L — ABNORMAL LOW (ref 3.5–5.1)
Sodium: 137 mmol/L (ref 135–145)
Total Bilirubin: 0.4 mg/dL (ref 0.0–1.2)
Total Protein: 7.4 g/dL (ref 6.5–8.1)

## 2024-01-01 LAB — CHLAMYDIA/NGC RT PCR (ARMC ONLY)
Chlamydia Tr: NOT DETECTED
N gonorrhoeae: NOT DETECTED

## 2024-01-01 LAB — LIPASE, BLOOD: Lipase: 117 U/L — ABNORMAL HIGH (ref 11–51)

## 2024-01-01 LAB — WET PREP, GENITAL
Sperm: NONE SEEN
Trich, Wet Prep: NONE SEEN
WBC, Wet Prep HPF POC: <10 (ref <10)
Yeast Wet Prep HPF POC: NONE SEEN

## 2024-01-01 LAB — PREGNANCY, URINE: Preg Test, Ur: NEGATIVE

## 2024-01-01 MED ORDER — METRONIDAZOLE 0.75 % VA GEL: 1.0000 | Freq: Every day | VAGINAL | 0 refills | Status: AC

## 2024-01-01 NOTE — ED Triage Notes (Addendum)
 Pt comes with c/o left sided belly pain for few days. Pt did have gallbladder removed a few months ago. Pt states vomiting.   Pt also states itching and burning with urination. Pt states some discharge that is yellow in coloration.

## 2024-01-01 NOTE — ED Notes (Signed)
Lab notified to add on Urine Pregnancy 

## 2024-01-01 NOTE — ED Notes (Signed)
 PA, Jenise and PA student at bedside to perform Pelvic Exam.

## 2024-01-01 NOTE — ED Provider Notes (Signed)
 [facility] Emergency Department Provider Note     Event Date/Time   First MD Initiated Contact with Patient 01/01/24 1300     (approximate)   History   Abdominal Pain   HPI  Briana Lyons is a 43 y.o. female with a history of bipolar disorder, depression, GERD, and pancreatitis presents to the ED for evaluation of left side lower abdominal pain that she would endorse a few days of symptoms with an episode of nonbloody, nonbilious vomiting.  She would endorse previous incomplete treatment for BV.  She denies any abnormal vaginal bleeding, bladder or bowel changes at this time.  She does endorse some vaginal discharge and reports some burning and itching with urination.  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  In an unrelated complaint, patient is endorsing some posterior neck pain on the left side, after an unhinged door at her home tipped over hitting the back of the head yesterday.  She denies any LOC, dizziness, blurry vision, weakness, paralysis.  Physical Exam   Triage Vital Signs: ED Triage Vitals  Encounter Vitals Group     BP 01/01/24 1205 (!) 168/109     Systolic BP Percentile --      Diastolic BP Percentile --      Pulse Rate 01/01/24 1205 (!) 101     Resp 01/01/24 1205 19     Temp 01/01/24 1205 98 F (36.7 C)     Temp src --      SpO2 01/01/24 1205 100 %     Weight 01/01/24 1203 188 lb (85.3 kg)     Height 01/01/24 1203 5\' 3"  (1.6 m)     Head Circumference --      Peak Flow --      Pain Score 01/01/24 1203 8     Pain Loc --      Pain Education --      Exclude from Growth Chart --     Most recent vital signs: Vitals:   01/01/24 1205  BP: (!) 168/109  Pulse: (!) 101  Resp: 19  Temp: 98 F (36.7 C)  SpO2: 100%    General Awake, no distress. NAD. A&O x 4 HEENT NCAT. PERRL. EOMI. No rhinorrhea. Mucous membranes are moist.  CV:  Good peripheral perfusion. RRR RESP:  Normal effort. CTA ABD:  No  distention.  Soft and nontender.  No guarding, rigidity, or organomegaly palpated.  Mild tender palpation to the anterior pelvic region.  No CVA tenderness elicited.  Normal bowel sounds x 4. GYN:  Normal external genitalia.  Vault reveals moderate whitish discharge.  No CMT or adnexal masses appreciated.   ED Results / Procedures / Treatments   Labs (all labs ordered are listed, but only abnormal results are displayed) Labs Reviewed  WET PREP, GENITAL - Abnormal; Notable for the following components:      Result Value   Clue Cells Wet Prep HPF POC PRESENT (*)    All other components within normal limits  LIPASE, BLOOD - Abnormal; Notable for the following components:   Lipase 117 (*)    All other components within normal limits  COMPREHENSIVE METABOLIC PANEL - Abnormal; Notable for the following components:   Potassium 3.4 (*)    Glucose, Bld 116 (*)    All other components within normal limits  URINALYSIS, ROUTINE W REFLEX MICROSCOPIC - Abnormal; Notable for the following components:   Color, Urine STRAW (*)    APPearance  HAZY (*)    Hgb urine dipstick MODERATE (*)    Bacteria, UA RARE (*)    All other components within normal limits  CHLAMYDIA/NGC RT PCR ([facility] ONLY)            CBC  PREGNANCY, URINE    EKG   RADIOLOGY  No results found.   PROCEDURES:  Critical Care performed: No  Procedures   MEDICATIONS ORDERED IN ED: Medications - No data to display   IMPRESSION / MDM / ASSESSMENT AND PLAN / ED COURSE  I reviewed the triage vital signs and the nursing notes.                              Differential diagnosis includes, but is not limited to,   Patient's presentation is most consistent with acute presentation with potential threat to life or bodily function.  Patient's diagnosis is consistent with BV.  Patient presents with vaginal discharge and dysuria.  UA is negative for any acute bacteriuria.  CBC is within normal limits, and chemistries overall  reassuring.  Patient with a mild elevation to lipase at 117.  Wet prep does confirm clue cells consistent with BV.  NGC PCR is pending at this time.  Low clinical concern for cervicitis at this time.  Patient is stable without intractable nausea or vomiting.  She is tolerating regular diet at this time.  Patient will be discharged home with prescriptions for MetroGel vaginal. Patient is to follow up with her PCP as discussed, as needed or otherwise directed. Patient is given ED precautions to return to the ED for any worsening or new symptoms.  FINAL CLINICAL IMPRESSION(S) / ED DIAGNOSES   Final diagnoses:  BV (bacterial vaginosis)     Rx / DC Orders   ED Discharge Orders          Ordered    metroNIDAZOLE (METROGEL) 0.75 % vaginal gel  Daily at bedtime        01/01/24 1505             Note:  This document was prepared using Dragon voice recognition software and may include unintentional dictation errors.    Lissa Hoard, PA-C 01/01/24 1539    Jene Every, MD 01/01/24 [phone]

## 2024-01-01 NOTE — Discharge Instructions (Signed)
 Use the vaginal gel nightly to treat bacterial vaginosis.

## 2024-01-01 NOTE — ED Notes (Signed)
Pelvic Cart set up at bedside. 

## 2024-01-04 DIAGNOSIS — F4310 Post-traumatic stress disorder, unspecified: Secondary | ICD-10-CM | POA: Diagnosis not present

## 2024-01-15 DIAGNOSIS — Z79899 Other long term (current) drug therapy: Secondary | ICD-10-CM | POA: Diagnosis not present

## 2024-02-06 ENCOUNTER — Ambulatory Visit: Admitting: Nurse Practitioner

## 2024-02-06 NOTE — Progress Notes (Deleted)
 There were no vitals taken for this visit.   Subjective:    Patient ID: Briana Lyons, female    DOB: 1981-09-18, 43 y.o.   MRN: [id number]  HPI: Briana Lyons is a 43 y.o. female  No chief complaint on file.  BOIL Patient states she has a boil on her back that has been there for about a week.  Denies any fever.  She is in pain in her right buttocks.  She has noticed some drainage, yellow/green.  She has used lidocaine and meloxicam.   Patient states she is having vaginal itching and foul odor.     Relevant past medical, surgical, family and social history reviewed and updated as indicated. Interim medical history since our last visit reviewed. Allergies and medications reviewed and updated.  Review of Systems  Genitourinary:        Vaginal odor and itching    Per HPI unless specifically indicated above     Objective:    There were no vitals taken for this visit.  Wt Readings from Last 3 Encounters:  01/01/24 188 lb (85.3 kg)  12/11/23 188 lb (85.3 kg)  11/22/23 190 lb 3.2 oz (86.3 kg)    Physical Exam Vitals and nursing note reviewed.  Constitutional:      General: She is not in acute distress.    Appearance: Normal appearance. She is normal weight. She is not ill-appearing, toxic-appearing or diaphoretic.  HENT:     Head: Normocephalic.     Right Ear: External ear normal.     Left Ear: External ear normal.     Nose: Nose normal.     Mouth/Throat:     Mouth: Mucous membranes are moist.     Pharynx: Oropharynx is clear.  Eyes:     General:        Right eye: No discharge.        Left eye: No discharge.     Extraocular Movements: Extraocular movements intact.     Conjunctiva/sclera: Conjunctivae normal.     Pupils: Pupils are equal, round, and reactive to light.  Cardiovascular:     Rate and Rhythm: Normal rate and regular rhythm.     Heart sounds: No murmur heard. Pulmonary:     Effort: Pulmonary effort is normal. No respiratory distress.     Breath sounds:  Normal breath sounds. No wheezing or rales.  Musculoskeletal:     Cervical back: Normal range of motion and neck supple.  Skin:    General: Skin is warm and dry.     Capillary Refill: Capillary refill takes less than 2 seconds.       Neurological:     General: No focal deficit present.     Mental Status: She is alert and oriented to person, place, and time. Mental status is at baseline.  Psychiatric:        Mood and Affect: Mood normal.        Behavior: Behavior normal.        Thought Content: Thought content normal.        Judgment: Judgment normal.    Results for orders placed or performed during the hospital encounter of 01/01/24  Lipase, blood   Collection Time: 01/01/24 12:05 PM  Result Value Ref Range   Lipase 117 (H) 11 - 51 U/L  Comprehensive metabolic panel   Collection Time: 01/01/24 12:05 PM  Result Value Ref Range   Sodium 137 135 - 145 mmol/L   Potassium 3.4 (L) 3.5 -  5.1 mmol/L   Chloride 103 98 - 111 mmol/L   CO2 23 22 - 32 mmol/L   Glucose, Bld 116 (H) 70 - 99 mg/dL   BUN 11 6 - 20 mg/dL   Creatinine, Ser 1.61 0.44 - 1.00 mg/dL   Calcium 9.3 8.9 - 09.6 mg/dL   Total Protein 7.4 6.5 - 8.1 g/dL   Albumin 4.0 3.5 - 5.0 g/dL   AST 25 15 - 41 U/L   ALT 22 0 - 44 U/L   Alkaline Phosphatase 64 38 - 126 U/L   Total Bilirubin 0.4 0.0 - 1.2 mg/dL   GFR, Estimated >04 >54 mL/min   Anion gap 11 5 - 15  CBC   Collection Time: 01/01/24 12:05 PM  Result Value Ref Range   WBC 6.8 4.0 - 10.5 K/uL   RBC 4.61 3.87 - 5.11 MIL/uL   Hemoglobin 13.4 12.0 - 15.0 g/dL   HCT 09.8 11.9 - 14.7 %   MCV 88.7 80.0 - 100.0 fL   MCH 29.1 26.0 - 34.0 pg   MCHC 32.8 30.0 - 36.0 g/dL   RDW 82.9 56.2 - 13.0 %   Platelets 383 150 - 400 K/uL   nRBC 0.0 0.0 - 0.2 %  Chlamydia/NGC rt PCR ([facility] only)   Collection Time: 01/01/24  1:36 PM   Specimen: Cervical/Vaginal swab; Genital  Result Value Ref Range   Specimen source GC/Chlam ENDOCERVICAL    Chlamydia Tr NOT DETECTED NOT DETECTED    N gonorrhoeae NOT DETECTED NOT DETECTED  Wet prep, genital   Collection Time: 01/01/24  1:36 PM   Specimen: Cervical/Vaginal swab  Result Value Ref Range   Yeast Wet Prep HPF POC NONE SEEN NONE SEEN   Trich, Wet Prep NONE SEEN NONE SEEN   Clue Cells Wet Prep HPF POC PRESENT (A) NONE SEEN   WBC, Wet Prep HPF POC <10 <10   Sperm NONE SEEN   Urinalysis, Routine w reflex microscopic -Urine, Random   Collection Time: 01/01/24  1:36 PM  Result Value Ref Range   Color, Urine STRAW (A) YELLOW   APPearance HAZY (A) CLEAR   Specific Gravity, Urine 1.005 1.005 - 1.030   pH 6.0 5.0 - 8.0   Glucose, UA NEGATIVE NEGATIVE mg/dL   Hgb urine dipstick MODERATE (A) NEGATIVE   Bilirubin Urine NEGATIVE NEGATIVE   Ketones, ur NEGATIVE NEGATIVE mg/dL   Protein, ur NEGATIVE NEGATIVE mg/dL   Nitrite NEGATIVE NEGATIVE   Leukocytes,Ua NEGATIVE NEGATIVE   RBC / HPF 0-5 0 - 5 RBC/hpf   WBC, UA 0-5 0 - 5 WBC/hpf   Bacteria, UA RARE (A) NONE SEEN   Squamous Epithelial / HPF 6-10 0 - 5 /HPF  Pregnancy, urine   Collection Time: 01/01/24  1:36 PM  Result Value Ref Range   Preg Test, Ur NEGATIVE NEGATIVE      Assessment & Plan:   Problem List Items Addressed This Visit   None     Follow up plan: No follow-ups on file.

## 2024-02-17 ENCOUNTER — Other Ambulatory Visit: Payer: Self-pay | Admitting: Pediatrics

## 2024-02-17 DIAGNOSIS — K219 Gastro-esophageal reflux disease without esophagitis: Secondary | ICD-10-CM

## 2024-02-19 NOTE — Telephone Encounter (Signed)
 Requested Prescriptions  Pending Prescriptions Disp Refills   omeprazole  (PRILOSEC) 40 MG capsule [Pharmacy Med Name: OMEPRAZOLE  DR 40 MG CAPSULE] 180 capsule 0    Sig: TAKE 1 CAPSULE (40 MG TOTAL) BY MOUTH 2 (TWO) TIMES DAILY BEFORE A MEAL.     Gastroenterology: Proton Pump Inhibitors Passed - 02/19/2024 10:59 AM      Passed - Valid encounter within last 12 months    Recent Outpatient Visits           2 months ago [facility] Briana Alexanders, NP

## 2024-03-06 ENCOUNTER — Emergency Department
Admission: EM | Admit: 2024-03-06 | Discharge: 2024-03-06 | Disposition: A | Discharge status: Home / Self Care | Attending: Emergency Medicine | Admitting: Emergency Medicine

## 2024-03-06 ENCOUNTER — Other Ambulatory Visit: Payer: Self-pay

## 2024-03-06 ENCOUNTER — Encounter: Payer: Self-pay | Admitting: Emergency Medicine

## 2024-03-06 DIAGNOSIS — W57XXXA Bitten or stung by nonvenomous insect and other nonvenomous arthropods, initial encounter: Secondary | ICD-10-CM | POA: Diagnosis not present

## 2024-03-06 DIAGNOSIS — S70362A Insect bite (nonvenomous), left thigh, initial encounter: Secondary | ICD-10-CM | POA: Insufficient documentation

## 2024-03-06 LAB — CBC WITH DIFFERENTIAL/PLATELET
Abs Immature Granulocytes: 0.02 10*3/uL (ref 0.00–0.07)
Basophils Absolute: 0 10*3/uL (ref 0.0–0.1)
Basophils Relative: 1 %
Eosinophils Absolute: 0.2 10*3/uL (ref 0.0–0.5)
Eosinophils Relative: 3 %
HCT: 37.4 % (ref 36.0–46.0)
Hemoglobin: 12 g/dL (ref 12.0–15.0)
Immature Granulocytes: 0 %
Lymphocytes Relative: 28 %
Lymphs Abs: 1.9 10*3/uL (ref 0.7–4.0)
MCH: 28.2 pg (ref 26.0–34.0)
MCHC: 32.1 g/dL (ref 30.0–36.0)
MCV: 88 fL (ref 80.0–100.0)
Monocytes Absolute: 0.6 10*3/uL (ref 0.1–1.0)
Monocytes Relative: 9 %
Neutro Abs: 4 10*3/uL (ref 1.7–7.7)
Neutrophils Relative %: 59 %
Platelets: 337 10*3/uL (ref 150–400)
RBC: 4.25 MIL/uL (ref 3.87–5.11)
RDW: 13.4 % (ref 11.5–15.5)
WBC: 6.8 10*3/uL (ref 4.0–10.5)
nRBC: 0 % (ref 0.0–0.2)

## 2024-03-06 LAB — URINALYSIS, ROUTINE W REFLEX MICROSCOPIC
Bacteria, UA: NONE SEEN
Bilirubin Urine: NEGATIVE
Glucose, UA: NEGATIVE mg/dL
Ketones, ur: NEGATIVE mg/dL
Leukocytes,Ua: NEGATIVE
Nitrite: NEGATIVE
Protein, ur: NEGATIVE mg/dL
Specific Gravity, Urine: 1.017 (ref 1.005–1.030)
pH: 6 (ref 5.0–8.0)

## 2024-03-06 LAB — BASIC METABOLIC PANEL WITH GFR
Anion gap: 6 (ref 5–15)
BUN: 12 mg/dL (ref 6–20)
CO2: 27 mmol/L (ref 22–32)
Calcium: 9.2 mg/dL (ref 8.9–10.3)
Chloride: 106 mmol/L (ref 98–111)
Creatinine, Ser: 0.72 mg/dL (ref 0.44–1.00)
GFR, Estimated: >60 mL/min (ref >60)
Glucose, Bld: 97 mg/dL (ref 70–99)
Potassium: 3.6 mmol/L (ref 3.5–5.1)
Sodium: 139 mmol/L (ref 135–145)

## 2024-03-06 LAB — POC URINE PREG, ED: Preg Test, Ur: NEGATIVE

## 2024-03-06 MED ORDER — DOXYCYCLINE HYCLATE 100 MG PO CAPS: 100.0000 mg | ORAL_CAPSULE | Freq: Two times a day (BID) | ORAL | 0 refills | Status: DC

## 2024-03-06 NOTE — ED Notes (Signed)
 See triage notes. Patient stated she was bit by a deer tick approximately five days ago. Patient is unsure if she was able to fully remove the tick. Patient stated she googled her symptoms and is just wants to rule out Lyme disease.

## 2024-03-06 NOTE — Discharge Instructions (Addendum)
 Please check your MyChart app in a couple of days to review the results of your blood tests for tickborne illness.  Follow-up with your primary care provider if any testing indicates that it is positive.  Return to the ER for symptoms that change or worsen.

## 2024-03-06 NOTE — ED Triage Notes (Signed)
 Patient to ED via POV for tick bite that occurred 5 days ago. States it was a deer tick. Now having headache, muscle pain, weakness in limbs, and cold chills. PT reports she is also late on her period and would like a pregnancy test.

## 2024-03-06 NOTE — ED Provider Notes (Signed)
 [facility] Provider Note    Event Date/Time   First MD Initiated Contact with Patient 03/06/24 1024     (approximate)   History   Tick Removal   HPI  Briana Lyons is a 43 y.o. female with history of GERD, bipolar wound, pancreatitis and as listed in EMR presents to the emergency department for evaluation after removing a tick 5 days ago that she believes was a deer tick on the left inner thigh.  She removed the tick but is unsure if she removed the head.  She denies having had a rash but states that she feels body aches, headache, generalized weakness and chills.  She denies fever.  She also reports that her menstrual cycle was late and would like to have a pregnancy test.      Physical Exam   Triage Vital Signs: ED Triage Vitals [03/06/24 1010]  Encounter Vitals Group     BP (!) 148/94     Systolic BP Percentile      Diastolic BP Percentile      Pulse Rate 89     Resp 17     Temp 98.6 F (37 C)     Temp Source Oral     SpO2 99 %     Weight 175 lb (79.4 kg)     Height 5\' 3"  (1.6 m)     Head Circumference      Peak Flow      Pain Score 7     Pain Loc      Pain Education      Exclude from Growth Chart     Most recent vital signs: Vitals:   03/06/24 1010  BP: (!) 148/94  Pulse: 89  Resp: 17  Temp: 98.6 F (37 C)  SpO2: 99%    General: Awake, no distress.  CV:  Good peripheral perfusion.  Resp:  Normal effort.  Abd:  No distention.  Other:  Pinpoint discolored area on the left inner thigh without surrounding erythema.  No other lesions noted.   ED Results / Procedures / Treatments   Labs (all labs ordered are listed, but only abnormal results are displayed) Labs Reviewed  URINALYSIS, ROUTINE W REFLEX MICROSCOPIC - Abnormal; Notable for the following components:      Result Value   Color, Urine YELLOW (*)    APPearance CLEAR (*)    Hgb urine dipstick SMALL (*)    All other components within normal limits  CBC WITH  DIFFERENTIAL/PLATELET  BASIC METABOLIC PANEL WITH GFR  LYME DISEASE SEROLOGY W/REFLEX  POC URINE PREG, ED     EKG  Not indicated   RADIOLOGY  Image and radiology report reviewed and interpreted by me. Radiology report consistent with the same.  Not indicated  PROCEDURES:  Critical Care performed: No  Procedures   MEDICATIONS ORDERED IN ED:  Medications - No data to display   IMPRESSION / MDM / ASSESSMENT AND PLAN / ED COURSE   I have reviewed the triage note.  Differential diagnosis includes, but is not limited to, cellulitis, tickborne illness, viral syndrome.  Patient's presentation is most consistent with acute complicated illness / injury requiring diagnostic workup.  43 year old female presenting to the emergency department 5 days after pulling a tick off the left inner thigh.  See HPI for further details.  Exam is reassuring.  She has no indication of cellulitis in the area of the tick bite.  Labs including those for tickborne illnesses ordered.  Basic  labs are reassuring.  Tickborne illness related labs are send outs and will be available in MyChart.  Patient was advised to check her MyChart and if any of them are positive she needs to see her primary care provider.  Doxycycline  will be started today.  Outpatient follow-up and ER return precautions discussed.      FINAL CLINICAL IMPRESSION(S) / ED DIAGNOSES   Final diagnoses:  Tick bite of left thigh, initial encounter     Rx / DC Orders   ED Discharge Orders          Ordered    doxycycline  (VIBRAMYCIN ) 100 MG capsule  2 times daily        03/06/24 1116             Note:  This document was prepared using Dragon voice recognition software and may include unintentional dictation errors.   Sherryle Don, FNP 03/06/24 1230    Shane Darling, MD 03/06/24 [phone]

## 2024-03-07 LAB — LYME DISEASE SEROLOGY W/REFLEX: Lyme Total Antibody EIA: NEGATIVE

## 2024-03-12 ENCOUNTER — Emergency Department

## 2024-03-12 ENCOUNTER — Other Ambulatory Visit: Payer: Self-pay

## 2024-03-12 ENCOUNTER — Emergency Department
Admission: EM | Admit: 2024-03-12 | Discharge: 2024-03-12 | Disposition: A | Discharge status: Home / Self Care | Attending: Emergency Medicine | Admitting: Emergency Medicine

## 2024-03-12 DIAGNOSIS — R109 Unspecified abdominal pain: Secondary | ICD-10-CM

## 2024-03-12 DIAGNOSIS — R1031 Right lower quadrant pain: Secondary | ICD-10-CM | POA: Diagnosis present

## 2024-03-12 LAB — COMPREHENSIVE METABOLIC PANEL WITH GFR
ALT: 17 U/L (ref 0–44)
AST: 15 U/L (ref 15–41)
Albumin: 3.9 g/dL (ref 3.5–5.0)
Alkaline Phosphatase: 69 U/L (ref 38–126)
Anion gap: 7 (ref 5–15)
BUN: 11 mg/dL (ref 6–20)
CO2: 29 mmol/L (ref 22–32)
Calcium: 8.8 mg/dL — ABNORMAL LOW (ref 8.9–10.3)
Chloride: 103 mmol/L (ref 98–111)
Creatinine, Ser: 0.69 mg/dL (ref 0.44–1.00)
GFR, Estimated: >60 mL/min (ref >60)
Glucose, Bld: 108 mg/dL — ABNORMAL HIGH (ref 70–99)
Potassium: 3.2 mmol/L — ABNORMAL LOW (ref 3.5–5.1)
Sodium: 139 mmol/L (ref 135–145)
Total Bilirubin: 0.3 mg/dL (ref 0.0–1.2)
Total Protein: 6.9 g/dL (ref 6.5–8.1)

## 2024-03-12 LAB — POC URINE PREG, ED: Preg Test, Ur: NEGATIVE

## 2024-03-12 LAB — URINALYSIS, ROUTINE W REFLEX MICROSCOPIC
Bacteria, UA: NONE SEEN
Bilirubin Urine: NEGATIVE
Glucose, UA: NEGATIVE mg/dL
Ketones, ur: NEGATIVE mg/dL
Leukocytes,Ua: NEGATIVE
Nitrite: NEGATIVE
Protein, ur: NEGATIVE mg/dL
Specific Gravity, Urine: 1.008 (ref 1.005–1.030)
pH: 7 (ref 5.0–8.0)

## 2024-03-12 LAB — CBC
HCT: 36.2 % (ref 36.0–46.0)
Hemoglobin: 11.7 g/dL — ABNORMAL LOW (ref 12.0–15.0)
MCH: 28.8 pg (ref 26.0–34.0)
MCHC: 32.3 g/dL (ref 30.0–36.0)
MCV: 89.2 fL (ref 80.0–100.0)
Platelets: 389 10*3/uL (ref 150–400)
RBC: 4.06 MIL/uL (ref 3.87–5.11)
RDW: 13.7 % (ref 11.5–15.5)
WBC: 9.5 10*3/uL (ref 4.0–10.5)
nRBC: 0 % (ref 0.0–0.2)

## 2024-03-12 LAB — LIPASE, BLOOD: Lipase: 129 U/L — ABNORMAL HIGH (ref 11–51)

## 2024-03-12 MED ORDER — ONDANSETRON HCL 4 MG/2ML IJ SOLN
4.0000 mg | Freq: Once | INTRAMUSCULAR | Status: AC
  Administered 2024-03-12: 4 mg via INTRAVENOUS
  Filled 2024-03-12: qty 2

## 2024-03-12 MED ORDER — KETOROLAC TROMETHAMINE 30 MG/ML IJ SOLN
30.0000 mg | Freq: Once | INTRAMUSCULAR | Status: AC
  Administered 2024-03-12: 30 mg via INTRAVENOUS
  Filled 2024-03-12: qty 1

## 2024-03-12 MED ORDER — OXYCODONE-ACETAMINOPHEN 5-325 MG PO TABS: 1.0000 | ORAL_TABLET | ORAL | 0 refills | Status: DC | PRN

## 2024-03-12 MED ORDER — KETOROLAC TROMETHAMINE 10 MG PO TABS: 10.0000 mg | ORAL_TABLET | Freq: Four times a day (QID) | ORAL | 0 refills | Status: DC | PRN

## 2024-03-12 MED ORDER — IOHEXOL 300 MG/ML  SOLN
100.0000 mL | Freq: Once | INTRAMUSCULAR | Status: AC | PRN
  Administered 2024-03-12: 100 mL via INTRAVENOUS

## 2024-03-12 MED ORDER — FENTANYL CITRATE PF 50 MCG/ML IJ SOSY
25.0000 ug | PREFILLED_SYRINGE | Freq: Once | INTRAMUSCULAR | Status: AC
  Administered 2024-03-12: 25 ug via INTRAVENOUS
  Filled 2024-03-12: qty 1

## 2024-03-12 NOTE — ED Notes (Signed)
 Pt up to use restroom.

## 2024-03-12 NOTE — ED Triage Notes (Signed)
 Pt sts that she has been having upper left abd pain for the last couple of days. Pt sts that she is just not feeling right today. Pt sts that she has been having nausea.

## 2024-03-12 NOTE — ED Notes (Signed)
 Pt taken to CT.

## 2024-03-12 NOTE — ED Notes (Signed)
 Pt stating she had some right flank pain when trying to urinate but it has subsided at this time.

## 2024-03-12 NOTE — ED Provider Notes (Signed)
 [facility] Provider Note   Event Date/Time   First MD Initiated Contact with Patient 03/12/24 1525     (approximate) History  Abdominal Pain  HPI Briana Lyons is a 43 y.o. female with stated past medical history of bipolar disorder, GERD, obesity, and chronic pancreatitis who presents complaining of right flank pain over the last 2-3 days.  Patient states that this pain is worse during urination.  Patient states that it is sometimes on the left upper quadrant of her abdomen and radiates around to her back on the right side.  Patient denies any other exacerbating or relieving factors.  Patient denies any recent travel, sick contacts, or symptoms similar to this in the past. ROS: Patient currently denies any vision changes, tinnitus, difficulty speaking, facial droop, sore throat, chest pain, shortness of breath, nausea/vomiting/diarrhea, dysuria, or weakness/numbness/paresthesias in any extremity   Physical Exam  Triage Vital Signs: ED Triage Vitals  Encounter Vitals Group     BP 03/12/24 1347 120/64     Systolic BP Percentile --      Diastolic BP Percentile --      Pulse Rate 03/12/24 1347 92     Resp 03/12/24 1347 18     Temp 03/12/24 1347 98.2 F (36.8 C)     Temp Source 03/12/24 1347 Oral     SpO2 03/12/24 1347 100 %     Weight 03/12/24 1348 174 lb 2.6 oz (79 kg)     Height 03/12/24 1348 5\' 3"  (1.6 m)     Head Circumference --      Peak Flow --      Pain Score 03/12/24 1348 3     Pain Loc --      Pain Education --      Exclude from Growth Chart --    Most recent vital signs: Vitals:   03/12/24 1545 03/12/24 1801  BP:  136/83  Pulse: 85 83  Resp: 18 18  Temp:  98 F (36.7 C)  SpO2: 100% 100%   General: Awake, oriented x4. CV:  Good peripheral perfusion.  Resp:  Normal effort.  Abd:  No distention.  Other:  Middle-aged obese Caucasian female resting comfortably in no acute distress ED Results / Procedures / Treatments  Labs (all labs  ordered are listed, but only abnormal results are displayed) Labs Reviewed  LIPASE, BLOOD - Abnormal; Notable for the following components:      Result Value   Lipase 129 (*)    All other components within normal limits  COMPREHENSIVE METABOLIC PANEL WITH GFR - Abnormal; Notable for the following components:   Potassium 3.2 (*)    Glucose, Bld 108 (*)    Calcium  8.8 (*)    All other components within normal limits  CBC - Abnormal; Notable for the following components:   Hemoglobin 11.7 (*)    All other components within normal limits  URINALYSIS, ROUTINE W REFLEX MICROSCOPIC - Abnormal; Notable for the following components:   Color, Urine STRAW (*)    APPearance CLEAR (*)    Hgb urine dipstick SMALL (*)    All other components within normal limits  POC URINE PREG, ED   RADIOLOGY ED MD interpretation: CT of the abdomen pelvis with IV contrast independently interpreted and shows no evidence of acute intra-abdominal or pelvic abnormalities -Agree with radiology assessment Official radiology report(s): CT ABDOMEN PELVIS W CONTRAST Result Date: 03/12/2024 CLINICAL DATA:  Upper abdominal pain EXAM: CT ABDOMEN AND PELVIS WITH CONTRAST TECHNIQUE:  Multidetector CT imaging of the abdomen and pelvis was performed using the standard protocol following bolus administration of intravenous contrast. RADIATION DOSE REDUCTION: This exam was performed according to the departmental dose-optimization program which includes automated exposure control, adjustment of the mA and/or kV according to patient size and/or use of iterative reconstruction technique. CONTRAST:  OMNIPAQUE  IOHEXOL  300 MG/ML  SOLN COMPARISON:  CT 09/02/2023 FINDINGS: Lower chest: No acute abnormality. Hepatobiliary: No focal liver abnormality is seen. Status post cholecystectomy. No biliary dilatation. Pancreas: Unremarkable. No pancreatic ductal dilatation or surrounding inflammatory changes. Spleen: Normal in size without focal  abnormality. Adrenals/Urinary Tract: Adrenal glands are unremarkable. Kidneys are normal, without renal calculi, focal lesion, or hydronephrosis. Bladder is unremarkable. Stomach/Bowel: Stomach is within normal limits. Appendectomy. No evidence of bowel wall thickening, distention, or inflammatory changes. Vascular/Lymphatic: No significant vascular findings are present. No enlarged abdominal or pelvic lymph nodes. Reproductive: Uterus unremarkable. No adnexal mass. Curvilinear low density in the region of the vagina, this may represent contraception or menstrual device, correlate with patient history Other: Negative for pelvic effusion or free air. Small fat containing umbilical hernia Musculoskeletal: No acute osseous abnormality. Advanced degenerative changes at L5-S1 IMPRESSION: No CT evidence for acute intra-abdominal or pelvic abnormality. Electronically Signed   By: Esmeralda Hedge M.D.   On: 03/12/2024 17:32   PROCEDURES: Critical Care performed: No Procedures MEDICATIONS ORDERED IN ED: Medications  ketorolac  (TORADOL ) 30 MG/ML injection 30 mg (has no administration in time range)  fentaNYL  (SUBLIMAZE ) injection 25 mcg (25 mcg Intravenous Given 03/12/24 1627)  ondansetron  (ZOFRAN ) injection 4 mg (4 mg Intravenous Given 03/12/24 1627)  iohexol  (OMNIPAQUE ) 300 MG/ML solution 100 mL (100 mLs Intravenous Contrast Given 03/12/24 1637)   IMPRESSION / MDM / ASSESSMENT AND PLAN / ED COURSE  I reviewed the triage vital signs and the nursing notes.                             The patient is on the cardiac monitor to evaluate for evidence of arrhythmia and/or significant heart rate changes. Patient's presentation is most consistent with acute presentation with potential threat to life or bodily function. Patient's symptoms not typical for emergent causes of abdominal pain such as, but not limited to, appendicitis, abdominal aortic aneurysm, surgical biliary disease, pancreatitis, SBO, mesenteric ischemia,  serious intra-abdominal bacterial illness. Presentation also not typical of gynecologic emergencies such as TOA, Ovarian Torsion, PID. Not Ectopic. Doubt atypical ACS.  Pt tolerating PO. Disposition: Patient will be discharged with strict return precautions and follow up with primary MD within 12-24 hours for further evaluation. Patient understands that this still may have an early presentation of an emergent medical condition such as appendicitis that will require a recheck.   FINAL CLINICAL IMPRESSION(S) / ED DIAGNOSES   Final diagnoses:  Acute right flank pain   Rx / DC Orders   ED Discharge Orders          Ordered    ketorolac  (TORADOL ) 10 MG tablet  Every 6 hours PRN       Note to Pharmacy: Patient given an IM/IV loading dose in emergency department   03/12/24 1827    oxyCODONE -acetaminophen  (PERCOCET) 5-325 MG tablet  Every 4 hours PRN        03/12/24 1827           Note:  This document was prepared using Dragon voice recognition software and may include unintentional dictation errors.  Charleen Conn, MD 03/12/24 [phone]

## 2024-04-08 ENCOUNTER — Other Ambulatory Visit: Payer: Self-pay

## 2024-04-08 ENCOUNTER — Emergency Department
Admission: EM | Admit: 2024-04-08 | Discharge: 2024-04-08 | Disposition: A | Discharge status: Home / Self Care | Payer: MEDICAID | Attending: Emergency Medicine | Admitting: Emergency Medicine

## 2024-04-08 ENCOUNTER — Emergency Department: Payer: MEDICAID

## 2024-04-08 DIAGNOSIS — M545 Low back pain, unspecified: Secondary | ICD-10-CM | POA: Insufficient documentation

## 2024-04-08 DIAGNOSIS — R109 Unspecified abdominal pain: Secondary | ICD-10-CM

## 2024-04-08 DIAGNOSIS — N83202 Unspecified ovarian cyst, left side: Secondary | ICD-10-CM | POA: Insufficient documentation

## 2024-04-08 DIAGNOSIS — N83201 Unspecified ovarian cyst, right side: Secondary | ICD-10-CM | POA: Diagnosis not present

## 2024-04-08 LAB — CBC
HCT: 37 % (ref 36.0–46.0)
Hemoglobin: 12.4 g/dL (ref 12.0–15.0)
MCH: 29.7 pg (ref 26.0–34.0)
MCHC: 33.5 g/dL (ref 30.0–36.0)
MCV: 88.5 fL (ref 80.0–100.0)
Platelets: 371 10*3/uL (ref 150–400)
RBC: 4.18 MIL/uL (ref 3.87–5.11)
RDW: 14.2 % (ref 11.5–15.5)
WBC: 8.5 10*3/uL (ref 4.0–10.5)
nRBC: 0 % (ref 0.0–0.2)

## 2024-04-08 LAB — URINALYSIS, ROUTINE W REFLEX MICROSCOPIC
Bacteria, UA: NONE SEEN
Bilirubin Urine: NEGATIVE
Glucose, UA: NEGATIVE mg/dL
Ketones, ur: NEGATIVE mg/dL
Leukocytes,Ua: NEGATIVE
Nitrite: NEGATIVE
Protein, ur: NEGATIVE mg/dL
Specific Gravity, Urine: 1.018 (ref 1.005–1.030)
pH: 5 (ref 5.0–8.0)

## 2024-04-08 LAB — BASIC METABOLIC PANEL WITH GFR
Anion gap: 10 (ref 5–15)
BUN: 14 mg/dL (ref 6–20)
CO2: 23 mmol/L (ref 22–32)
Calcium: 9.2 mg/dL (ref 8.9–10.3)
Chloride: 106 mmol/L (ref 98–111)
Creatinine, Ser: 0.56 mg/dL (ref 0.44–1.00)
GFR, Estimated: >60 mL/min (ref >60)
Glucose, Bld: 108 mg/dL — ABNORMAL HIGH (ref 70–99)
Potassium: 3.6 mmol/L (ref 3.5–5.1)
Sodium: 139 mmol/L (ref 135–145)

## 2024-04-08 LAB — HEPATIC FUNCTION PANEL
ALT: 11 U/L (ref 0–44)
AST: 14 U/L — ABNORMAL LOW (ref 15–41)
Albumin: 3.9 g/dL (ref 3.5–5.0)
Alkaline Phosphatase: 70 U/L (ref 38–126)
Bilirubin, Direct: <0.1 mg/dL (ref 0.0–0.2)
Total Bilirubin: 0.4 mg/dL (ref 0.0–1.2)
Total Protein: 7 g/dL (ref 6.5–8.1)

## 2024-04-08 LAB — POC URINE PREG, ED: Preg Test, Ur: NEGATIVE

## 2024-04-08 LAB — LIPASE, BLOOD: Lipase: 48 U/L (ref 11–51)

## 2024-04-08 MED ORDER — KETOROLAC TROMETHAMINE 30 MG/ML IJ SOLN
30.0000 mg | Freq: Once | INTRAMUSCULAR | Status: AC
  Administered 2024-04-08: 30 mg via INTRAMUSCULAR
  Filled 2024-04-08: qty 1

## 2024-04-08 MED ORDER — LIDOCAINE 5 % EX PTCH: 1.0000 | MEDICATED_PATCH | Freq: Two times a day (BID) | CUTANEOUS | 0 refills | Status: DC

## 2024-04-08 MED ORDER — CYCLOBENZAPRINE HCL 5 MG PO TABS: 5.0000 mg | ORAL_TABLET | Freq: Three times a day (TID) | ORAL | 0 refills | Status: DC | PRN

## 2024-04-08 NOTE — ED Provider Notes (Signed)
 [facility] Provider Note    Event Date/Time   First MD Initiated Contact with Patient 04/08/24 1416     (approximate)   History   Chief Complaint Flank Pain   HPI  Briana Lyons is a 43 y.o. female with past medical history of bipolar disorder, GERD, and pancreatitis who presents to the ED complaining of flank pain.  Patient reports that she has been dealing with waxing and waning pain in her left flank and left lower back for the past week.  Pain is described as sharp and worse with movement, will occasionally radiate into the left lower quadrant of her abdomen.  She reports a history of kidney stones, but has not had any dysuria, hematuria, vaginal bleeding, or discharge.  She has not taken anything for her pain prior to arrival.     Physical Exam   Triage Vital Signs: ED Triage Vitals  Encounter Vitals Group     BP 04/08/24 1313 (!) 139/93     Girls Systolic BP Percentile --      Girls Diastolic BP Percentile --      Boys Systolic BP Percentile --      Boys Diastolic BP Percentile --      Pulse Rate 04/08/24 1313 82     Resp 04/08/24 1313 16     Temp 04/08/24 1313 98 F (36.7 C)     Temp src --      SpO2 04/08/24 1313 98 %     Weight 04/08/24 1304 174 lb 2.6 oz (79 kg)     Height 04/08/24 1304 5' 3 (1.6 m)     Head Circumference --      Peak Flow --      Pain Score 04/08/24 1304 8     Pain Loc --      Pain Education --      Exclude from Growth Chart --     Most recent vital signs: Vitals:   04/08/24 1313  BP: (!) 139/93  Pulse: 82  Resp: 16  Temp: 98 F (36.7 C)  SpO2: 98%    Constitutional: Alert and oriented. Eyes: Conjunctivae are normal. Head: Atraumatic. Nose: No congestion/rhinnorhea. Mouth/Throat: Mucous membranes are moist.  Cardiovascular: Normal rate, regular rhythm. Grossly normal heart sounds.  2+ radial and DP pulses bilaterally. Respiratory: Normal respiratory effort.  No retractions. Lungs  CTAB. Gastrointestinal: Soft and nontender.  No CVA tenderness bilaterally.  No distention. Musculoskeletal: No lower extremity tenderness nor edema.  Tenderness to palpation over left lower back. Neurologic:  Normal speech and language. No gross focal neurologic deficits are appreciated.    ED Results / Procedures / Treatments   Labs (all labs ordered are listed, but only abnormal results are displayed) Labs Reviewed  URINALYSIS, ROUTINE W REFLEX MICROSCOPIC - Abnormal; Notable for the following components:      Result Value   Color, Urine YELLOW (*)    APPearance HAZY (*)    Hgb urine dipstick MODERATE (*)    All other components within normal limits  BASIC METABOLIC PANEL WITH GFR - Abnormal; Notable for the following components:   Glucose, Bld 108 (*)    All other components within normal limits  HEPATIC FUNCTION PANEL - Abnormal; Notable for the following components:   AST 14 (*)    All other components within normal limits  CBC  LIPASE, BLOOD  POC URINE PREG, ED    RADIOLOGY Pelvic ultrasound reviewed and interpreted by me with no evidence  of torsion.  PROCEDURES:  Critical Care performed: No  Procedures   MEDICATIONS ORDERED IN ED: Medications  ketorolac  (TORADOL ) 30 MG/ML injection 30 mg (30 mg Intramuscular Given 04/08/24 1452)     IMPRESSION / MDM / ASSESSMENT AND PLAN / ED COURSE  I reviewed the triage vital signs and the nursing notes.                              43 y.o. female with past medical history of bipolar disorder, GERD, and pancreatitis who presents to the ED with waxing and waning left flank and left lower back pain for the past week.  Patient's presentation is most consistent with acute presentation with potential threat to life or bodily function.  Differential diagnosis includes, but is not limited to, pyelonephritis, kidney stone, ectopic pregnancy, ovarian torsion, cystitis, diverticulitis, lumbar strain.  Patient nontoxic-appearing  and in no acute distress, vital signs are unremarkable.  She has a benign abdominal exam with no CVA tenderness, pain primarily reproduced with palpation of her left lower back.  I did review her CT imaging from ED visit less than 1 month ago, no kidney stones noted at this time and with no blood on her urinalysis, low suspicion for kidney stone today.  Pregnancy testing is negative, will further assess with pelvic ultrasound to assess for ovarian torsion or other pelvic pathology.  Labs without significant anemia, leukocytosis, electrolyte abnormality, or AKI.  We will add on LFTs and lipase, treat symptomatically with IM Toradol .  Pelvic ultrasound shows bilateral ovarian cysts with no evidence of torsion.  These could be contributing to her pain but would favor lumbar strain given her left lower back tenderness.  LFTs and lipase are unremarkable, patient's pain improved on reassessment.  She is appropriate for discharge home with outpatient PCP and OB/GYN follow-up, was counseled to return to the ED for new or worsening symptoms.  Patient agrees with plan.      FINAL CLINICAL IMPRESSION(S) / ED DIAGNOSES   Final diagnoses:  Left flank pain  Acute left-sided low back pain without sciatica  Cysts of both ovaries     Rx / DC Orders   ED Discharge Orders          Ordered    lidocaine  (LIDODERM ) 5 %  Every 12 hours        04/08/24 1626    cyclobenzaprine  (FLEXERIL ) 5 MG tablet  3 times daily PRN        04/08/24 1626             Note:  This document was prepared using Dragon voice recognition software and may include unintentional dictation errors.   Twilla Galea, MD 04/08/24 [phone]

## 2024-04-08 NOTE — ED Triage Notes (Signed)
 Pt states L flank pain. Pt denies fevers, HX of same.

## 2024-04-12 ENCOUNTER — Other Ambulatory Visit: Payer: Self-pay

## 2024-04-12 ENCOUNTER — Emergency Department
Admission: EM | Admit: 2024-04-12 | Discharge: 2024-04-12 | Disposition: A | Discharge status: Home / Self Care | Payer: MEDICAID | Attending: Emergency Medicine | Admitting: Emergency Medicine

## 2024-04-12 DIAGNOSIS — R55 Syncope and collapse: Secondary | ICD-10-CM | POA: Diagnosis present

## 2024-04-12 DIAGNOSIS — H538 Other visual disturbances: Secondary | ICD-10-CM | POA: Diagnosis not present

## 2024-04-12 DIAGNOSIS — R42 Dizziness and giddiness: Secondary | ICD-10-CM | POA: Insufficient documentation

## 2024-04-12 LAB — CBC
HCT: 35.6 % — ABNORMAL LOW (ref 36.0–46.0)
Hemoglobin: 11.5 g/dL — ABNORMAL LOW (ref 12.0–15.0)
MCH: 28.4 pg (ref 26.0–34.0)
MCHC: 32.3 g/dL (ref 30.0–36.0)
MCV: 87.9 fL (ref 80.0–100.0)
Platelets: 345 10*3/uL (ref 150–400)
RBC: 4.05 MIL/uL (ref 3.87–5.11)
RDW: 13.9 % (ref 11.5–15.5)
WBC: 6.4 10*3/uL (ref 4.0–10.5)
nRBC: 0 % (ref 0.0–0.2)

## 2024-04-12 LAB — COMPREHENSIVE METABOLIC PANEL WITH GFR
ALT: 11 U/L (ref 0–44)
AST: 15 U/L (ref 15–41)
Albumin: 3.8 g/dL (ref 3.5–5.0)
Alkaline Phosphatase: 65 U/L (ref 38–126)
Anion gap: 9 (ref 5–15)
BUN: 13 mg/dL (ref 6–20)
CO2: 24 mmol/L (ref 22–32)
Calcium: 8.9 mg/dL (ref 8.9–10.3)
Chloride: 105 mmol/L (ref 98–111)
Creatinine, Ser: 0.66 mg/dL (ref 0.44–1.00)
GFR, Estimated: >60 mL/min (ref >60)
Glucose, Bld: 98 mg/dL (ref 70–99)
Potassium: 3.5 mmol/L (ref 3.5–5.1)
Sodium: 138 mmol/L (ref 135–145)
Total Bilirubin: 0.5 mg/dL (ref 0.0–1.2)
Total Protein: 6.8 g/dL (ref 6.5–8.1)

## 2024-04-12 LAB — URINALYSIS, COMPLETE (UACMP) WITH MICROSCOPIC
Bacteria, UA: NONE SEEN
Bilirubin Urine: NEGATIVE
Glucose, UA: NEGATIVE mg/dL
Ketones, ur: NEGATIVE mg/dL
Leukocytes,Ua: NEGATIVE
Nitrite: NEGATIVE
Protein, ur: NEGATIVE mg/dL
Specific Gravity, Urine: 1.016 (ref 1.005–1.030)
pH: 5 (ref 5.0–8.0)

## 2024-04-12 MED ORDER — HYDROXYZINE HCL 25 MG PO TABS: 25.0000 mg | ORAL_TABLET | Freq: Two times a day (BID) | ORAL | 0 refills | Status: DC | PRN

## 2024-04-12 NOTE — ED Provider Notes (Signed)
 [facility] Provider Note    Event Date/Time   First MD Initiated Contact with Patient 04/12/24 1107     (approximate)  History   Chief Complaint: Blurred Vision  HPI  Briana Lyons is a 43 y.o. female with a past medical history of bipolar, gastric reflux, presents to the emergency department for episodes of dizziness/lightheadedness.  According to the patient over the last 2 to 3 days she has had 2 episodes now where she has become dizzy which she describes as a feeling like she is going to pass out, states that her vision starts going dark.  Patient states symptoms for started 2 days ago when she was at orientation for a new job.  Patient states it might have been her nerves however yesterday she states while at home her symptoms happened again which concerned her so she came to the emergency department.  Patient denies any symptoms currently.  As a secondary complaint she states she has also had some fullness in her left ear and some drainage in her throat but no fever.  Physical Exam   Triage Vital Signs: ED Triage Vitals  Encounter Vitals Group     BP 04/12/24 1102 (!) 143/99     Girls Systolic BP Percentile --      Girls Diastolic BP Percentile --      Boys Systolic BP Percentile --      Boys Diastolic BP Percentile --      Pulse Rate 04/12/24 1102 83     Resp 04/12/24 1102 16     Temp 04/12/24 1102 98.6 F (37 C)     Temp Source 04/12/24 1102 Oral     SpO2 04/12/24 1102 100 %     Weight 04/12/24 1103 183 lb (83 kg)     Height 04/12/24 1103 5' 3 (1.6 m)     Head Circumference --      Peak Flow --      Pain Score 04/12/24 1102 9     Pain Loc --      Pain Education --      Exclude from Growth Chart --     Most recent vital signs: Vitals:   04/12/24 1102  BP: (!) 143/99  Pulse: 83  Resp: 16  Temp: 98.6 F (37 C)  SpO2: 100%    General: Awake, no distress.  CV:  Good peripheral perfusion.  Regular rate and rhythm  Resp:  Normal  effort.  Equal breath sounds bilaterally.  Abd:  No distention.  Soft, nontender.  No rebound or guarding. Other:  Normal-appearing tympanic membranes as well as oropharynx.   ED Results / Procedures / Treatments   EKG  EKG viewed and interpreted by myself shows a normal sinus rhythm at 77 bpm with a narrow QRS, normal axis, normal intervals, nonspecific ST changes without elevation.  MEDICATIONS ORDERED IN ED: Medications - No data to display   IMPRESSION / MDM / ASSESSMENT AND PLAN / ED COURSE  I reviewed the triage vital signs and the nursing notes.  Patient's presentation is most consistent with acute presentation with potential threat to life or bodily function.  Patient presents to the emergency department for intermittent dizziness and near syncope occurring over the last 2 days.  Overall the patient appears well, no distress, reassuring vital signs, reassuring physical exam.  Patient also states some fullness in the left ear and some congestion.  Normal-appearing tympanic membranes and oropharynx.  Given the patient's episodes of near  syncope x 2 we will check labs and an EKG as a precaution.  Symptoms seem likely could be suggestive more of anxiety as well given they occurred initially during a situation of high stress at a new job during orientation.  If the patient's workup does not show any significant findings I believe the patient may benefit from a trial of hydroxyzine to be used as needed.  Patient agreeable to this as well.  Patient's workup is reassuring normal urinalysis normal CBC normal chemistry.  EKG reassuring as well.  Given the patient's reassuring workup reassuring physical exam and reassuring vital signs I believe the patient safe for discharge home with an outpatient follow-up.  Will discharge with a short course of hydroxyzine to be used if needed.  Patient agreeable to plan of care.  FINAL CLINICAL IMPRESSION(S) / ED DIAGNOSES   Near syncope    Note:  This  document was prepared using Dragon voice recognition software and may include unintentional dictation errors.   Ruth Cove, MD 04/12/24 1227

## 2024-04-12 NOTE — ED Triage Notes (Signed)
 Pt states that she has been feeling shaking and having some blurred vision, pt was dropped off by her aunt

## 2024-05-07 ENCOUNTER — Emergency Department
Admission: EM | Admit: 2024-05-07 | Discharge: 2024-05-07 | Disposition: A | Discharge status: Home / Self Care | Payer: MEDICAID

## 2024-05-07 ENCOUNTER — Other Ambulatory Visit: Payer: Self-pay

## 2024-05-07 DIAGNOSIS — R5383 Other fatigue: Secondary | ICD-10-CM | POA: Insufficient documentation

## 2024-05-07 DIAGNOSIS — R110 Nausea: Secondary | ICD-10-CM | POA: Diagnosis present

## 2024-05-07 DIAGNOSIS — R109 Unspecified abdominal pain: Secondary | ICD-10-CM | POA: Insufficient documentation

## 2024-05-07 DIAGNOSIS — R42 Dizziness and giddiness: Secondary | ICD-10-CM | POA: Insufficient documentation

## 2024-05-07 LAB — HCG, QUANTITATIVE, PREGNANCY: hCG, Beta Chain, Quant, S: 3 m[IU]/mL (ref <5)

## 2024-05-07 LAB — CBC WITH DIFFERENTIAL/PLATELET
Abs Immature Granulocytes: 0.02 K/uL (ref 0.00–0.07)
Basophils Absolute: 0 K/uL (ref 0.0–0.1)
Basophils Relative: 1 %
Eosinophils Absolute: 0.2 K/uL (ref 0.0–0.5)
Eosinophils Relative: 2 %
HCT: 39.9 % (ref 36.0–46.0)
Hemoglobin: 12.9 g/dL (ref 12.0–15.0)
Immature Granulocytes: 0 %
Lymphocytes Relative: 28 %
Lymphs Abs: 2.1 K/uL (ref 0.7–4.0)
MCH: 28.3 pg (ref 26.0–34.0)
MCHC: 32.3 g/dL (ref 30.0–36.0)
MCV: 87.5 fL (ref 80.0–100.0)
Monocytes Absolute: 0.6 K/uL (ref 0.1–1.0)
Monocytes Relative: 8 %
Neutro Abs: 4.8 K/uL (ref 1.7–7.7)
Neutrophils Relative %: 61 %
Platelets: 391 K/uL (ref 150–400)
RBC: 4.56 MIL/uL (ref 3.87–5.11)
RDW: 13.2 % (ref 11.5–15.5)
WBC: 7.7 K/uL (ref 4.0–10.5)
nRBC: 0 % (ref 0.0–0.2)

## 2024-05-07 LAB — URINALYSIS, COMPLETE (UACMP) WITH MICROSCOPIC
Bacteria, UA: NONE SEEN
Bilirubin Urine: NEGATIVE
Glucose, UA: NEGATIVE mg/dL
Ketones, ur: NEGATIVE mg/dL
Leukocytes,Ua: NEGATIVE
Nitrite: NEGATIVE
Protein, ur: NEGATIVE mg/dL
Specific Gravity, Urine: 1.017 (ref 1.005–1.030)
pH: 5 (ref 5.0–8.0)

## 2024-05-07 LAB — CHLAMYDIA/NGC RT PCR (ARMC ONLY)
Chlamydia Tr: NOT DETECTED
N gonorrhoeae: NOT DETECTED

## 2024-05-07 LAB — RESP PANEL BY RT-PCR (RSV, FLU A&B, COVID)  RVPGX2
Influenza A by PCR: NEGATIVE
Influenza B by PCR: NEGATIVE
Resp Syncytial Virus by PCR: NEGATIVE
SARS Coronavirus 2 by RT PCR: NEGATIVE

## 2024-05-07 LAB — COMPREHENSIVE METABOLIC PANEL WITH GFR
ALT: 8 U/L (ref 0–44)
AST: 18 U/L (ref 15–41)
Albumin: 4.5 g/dL (ref 3.5–5.0)
Alkaline Phosphatase: 75 U/L (ref 38–126)
Anion gap: 11 (ref 5–15)
BUN: 16 mg/dL (ref 6–20)
CO2: 26 mmol/L (ref 22–32)
Calcium: 9.9 mg/dL (ref 8.9–10.3)
Chloride: 103 mmol/L (ref 98–111)
Creatinine, Ser: 0.74 mg/dL (ref 0.44–1.00)
GFR, Estimated: >60 mL/min (ref >60)
Glucose, Bld: 91 mg/dL (ref 70–99)
Potassium: 4.4 mmol/L (ref 3.5–5.1)
Sodium: 140 mmol/L (ref 135–145)
Total Bilirubin: 0.7 mg/dL (ref 0.0–1.2)
Total Protein: 8 g/dL (ref 6.5–8.1)

## 2024-05-07 LAB — TROPONIN I (HIGH SENSITIVITY): Troponin I (High Sensitivity): 5 ng/L (ref <18)

## 2024-05-07 LAB — LIPASE, BLOOD: Lipase: 38 U/L (ref 11–51)

## 2024-05-07 LAB — PREGNANCY, URINE: Preg Test, Ur: NEGATIVE

## 2024-05-07 MED ORDER — ACETAMINOPHEN 500 MG PO TABS
1000.0000 mg | ORAL_TABLET | Freq: Once | ORAL | Status: AC
  Administered 2024-05-07: 1000 mg via ORAL
  Filled 2024-05-07: qty 2

## 2024-05-07 MED ORDER — SODIUM CHLORIDE 0.9 % IV BOLUS
1000.0000 mL | Freq: Once | INTRAVENOUS | Status: AC
  Administered 2024-05-07: 1000 mL via INTRAVENOUS

## 2024-05-07 MED ORDER — ONDANSETRON 4 MG PO TBDP: 4.0000 mg | ORAL_TABLET | Freq: Three times a day (TID) | ORAL | 0 refills | Status: DC | PRN

## 2024-05-07 MED ORDER — ONDANSETRON HCL 4 MG/2ML IJ SOLN
4.0000 mg | Freq: Once | INTRAMUSCULAR | Status: AC
  Administered 2024-05-07: 4 mg via INTRAVENOUS
  Filled 2024-05-07: qty 2

## 2024-05-07 NOTE — ED Provider Notes (Signed)
 [facility] Provider Note    Event Date/Time   First MD Initiated Contact with Patient 05/07/24 1501     (approximate)   History   Diarrhea (/)  Pt to ED via POV from home. Pt reports chills, sweats and lower abd pain x3 days. Pt reports an episode of diarrhea this am.    HPI Rital Cavey is a 43 y.o. female PMH bipolar 1 disorder, GERD, ovarian cysts, pancreatitis, chronic calculus cholecystitis presents for evaluation of multiple complaints -Over the past week, patient has not been feeling well.  Notes she has been having some nausea, lightheadedness, feeling cramping intermittent abdominal discomfort with some diarrhea, nonblack, nonbloody.  No recent travel or antibiotics.  Also notes some recent nasal congestion.  Notes that family members also had congestion and diarrhea over the past 1-2 days. -No significant abdominal pain currently.  Has noted some intermittent flank pains as well.  No significant headache.  No chest pain or shortness of breath.      Physical Exam   Triage Vital Signs: ED Triage Vitals  Encounter Vitals Group     BP 05/07/24 1216 (!) 140/93     Girls Systolic BP Percentile --      Girls Diastolic BP Percentile --      Boys Systolic BP Percentile --      Boys Diastolic BP Percentile --      Pulse Rate 05/07/24 1216 79     Resp 05/07/24 1216 18     Temp 05/07/24 1216 97.9 F (36.6 C)     Temp Source 05/07/24 1216 Oral     SpO2 05/07/24 1216 100 %     Weight --      Height --      Head Circumference --      Peak Flow --      Pain Score 05/07/24 1217 6     Pain Loc --      Pain Education --      Exclude from Growth Chart --     Most recent vital signs: Vitals:   05/07/24 1216  BP: (!) 140/93  Pulse: 79  Resp: 18  Temp: 97.9 F (36.6 C)  SpO2: 100%     General: Awake, no distress.  CV:  Good peripheral perfusion. RRR, RP 2+ Resp:  Normal effort. CTAB Abd:  No distention. Nontender to deep palpation  throughout. No CVAT.    ED Results / Procedures / Treatments   Labs (all labs ordered are listed, but only abnormal results are displayed) Labs Reviewed  URINALYSIS, COMPLETE (UACMP) WITH MICROSCOPIC - Abnormal; Notable for the following components:      Result Value   Color, Urine YELLOW (*)    APPearance CLEAR (*)    Hgb urine dipstick SMALL (*)    All other components within normal limits  RESP PANEL BY RT-PCR (RSV, FLU A&B, COVID)  RVPGX2  CHLAMYDIA/NGC RT PCR ([facility] ONLY)            CBC WITH DIFFERENTIAL/PLATELET  HCG, QUANTITATIVE, PREGNANCY  PREGNANCY, URINE  COMPREHENSIVE METABOLIC PANEL WITH GFR  LIPASE, BLOOD  TROPONIN I (HIGH SENSITIVITY)  TROPONIN I (HIGH SENSITIVITY)     EKG  See ED course below   RADIOLOGY N/a    PROCEDURES:  Critical Care performed: No  Procedures   MEDICATIONS ORDERED IN ED: Medications  sodium chloride  0.9 % bolus 1,000 mL (1,000 mLs Intravenous New Bag/Given 05/07/24 1648)  ondansetron  (ZOFRAN ) injection 4 mg (4  mg Intravenous Given 05/07/24 1650)  acetaminophen  (TYLENOL ) tablet 1,000 mg (1,000 mg Oral Given 05/07/24 1652)     IMPRESSION / MDM / ASSESSMENT AND PLAN / ED COURSE  I reviewed the triage vital signs and the nursing notes.                              DDX/MDM/AP: Differential diagnosis includes, but is not limited to, viral syndrome, consider anemia or electrolyte abnormality, consider gastroenteritis.  Not clinically concern for acute intra-abdominal pathology at this time with very benign abdominal exam.  Consider underlying UTI.  Doubt atypical ACS.  Polypharmacy patient also notes very mild vaginal discharge that is chronic but is requesting STI testing, requesting a self swab gonorrhea/chlamydia test.  Plan: - Labs - IV fluid - EKG - Zofran , Tylenol  - No indication for emergent imaging - Reassess  Patient's presentation is most consistent with acute presentation with potential threat to life or bodily  function.   ED course below.   Clinical Course as of 05/07/24 1813  Tue May 07, 2024  1643 CBC unremarkable Troponin normal hCG negative [MM]  1719 CMP reviewed, unremarkable Urinalysis with no evidence of infection [MM]  1809 Viral swab negative [MM]  1812 Patient reevaluated, all symptoms have resolved, feeling much better after Tylenol , Zofran , IV fluids.  Overall suspect likely viral syndrome.  Rx Zofran , recommend Tylenol , Motrin  as needed.  Recommend oral hydration.  Plan for PMD follow-up for any ongoing symptoms.  ED return precautions in place.  Patient agrees with plan. [MM]  1812 Ecg = sinus bradycardia, rate 59, no gross ST elevation or depression, no significant repolarization abnormality, normal axis, normal intervals.  No evidence of ischemia nor arrhythmia on my interpretation. [MM]    Clinical Course User Index [MM] Clarine Ozell LABOR, MD     FINAL CLINICAL IMPRESSION(S) / ED DIAGNOSES   Final diagnoses:  Nausea  Other fatigue  Lightheadedness  Abdominal cramping     Rx / DC Orders   ED Discharge Orders          Ordered    ondansetron  (ZOFRAN -ODT) 4 MG disintegrating tablet  Every 8 hours PRN        05/07/24 1811             Note:  This document was prepared using Dragon voice recognition software and may include unintentional dictation errors.   Clarine Ozell LABOR, MD 05/07/24 [phone]

## 2024-05-07 NOTE — ED Notes (Signed)
 Patient tolerated water well and was given graham crackers and a soda per request.

## 2024-05-07 NOTE — Discharge Instructions (Signed)
 Your evaluation in the emergency department was reassuring.  I am unsure as to the exact cause of your symptoms, but you may have a virus causing you to feel unwell.  You tested negative for COVID, influenza, and RSV.  I prescribed you nausea medication to use as needed for any ongoing symptoms, and you can use Tylenol  and Motrin  for any pain.  Please follow-up with your primary care provider for reevaluation, and return to the emergency department with any new or worsening symptoms.

## 2024-05-07 NOTE — ED Notes (Signed)
 Patient declined discharge vital signs.

## 2024-05-07 NOTE — ED Triage Notes (Signed)
 Pt to ED via POV from home. Pt reports chills, sweats and lower abd pain x3 days. Pt reports an episode of diarrhea this am.

## 2024-05-14 ENCOUNTER — Ambulatory Visit: Payer: MEDICAID | Attending: Family Medicine

## 2024-05-14 ENCOUNTER — Encounter: Payer: Self-pay | Admitting: Family Medicine

## 2024-05-14 ENCOUNTER — Ambulatory Visit (INDEPENDENT_AMBULATORY_CARE_PROVIDER_SITE_OTHER): Payer: MEDICAID | Admitting: Family Medicine

## 2024-05-14 ENCOUNTER — Ambulatory Visit: Payer: Self-pay

## 2024-05-14 VITALS — BP 132/89 | HR 83 | Temp 98.2°F | Ht 63.0 in | Wt 188.8 lb

## 2024-05-14 DIAGNOSIS — R002 Palpitations: Secondary | ICD-10-CM | POA: Diagnosis not present

## 2024-05-14 DIAGNOSIS — Z789 Other specified health status: Secondary | ICD-10-CM

## 2024-05-14 DIAGNOSIS — Z136 Encounter for screening for cardiovascular disorders: Secondary | ICD-10-CM

## 2024-05-14 DIAGNOSIS — F419 Anxiety disorder, unspecified: Secondary | ICD-10-CM | POA: Diagnosis not present

## 2024-05-14 DIAGNOSIS — L03312 Cellulitis of back [any part except buttock]: Secondary | ICD-10-CM | POA: Diagnosis not present

## 2024-05-14 DIAGNOSIS — R7301 Impaired fasting glucose: Secondary | ICD-10-CM | POA: Diagnosis not present

## 2024-05-14 LAB — BAYER DCA HB A1C WAIVED: HB A1C (BAYER DCA - WAIVED): 5.6 % (ref 4.8–5.6)

## 2024-05-14 MED ORDER — HYDROXYZINE HCL 25 MG PO TABS: 25.0000 mg | ORAL_TABLET | Freq: Two times a day (BID) | ORAL | 0 refills | Status: DC | PRN

## 2024-05-14 MED ORDER — QUETIAPINE FUMARATE 25 MG PO TABS: 25.0000 mg | ORAL_TABLET | Freq: Every day | ORAL | 0 refills | Status: DC

## 2024-05-14 MED ORDER — SULFAMETHOXAZOLE-TRIMETHOPRIM 800-160 MG PO TABS
1.0000 | ORAL_TABLET | Freq: Two times a day (BID) | ORAL | 0 refills | Status: DC
Start: 2024-05-14 — End: 2024-05-24

## 2024-05-14 NOTE — Telephone Encounter (Signed)
 FYI Only or Action Required?: FYI only for provider.  Patient was last seen in primary care on 12/11/2023 by Melvin Pao, NP.  Called Nurse Triage reporting Blurred Vision, Dizziness, and Back Pain.  Symptoms began several weeks ago to a month ago.  Interventions attempted: OTC medications: Tylenol  and Prescription medications: lidocaine  patches, hydroxyzine , flexeril .  Symptoms are: bilateral flank pain worse on left, anxiety, intermittent lightheaded and blurry vision, hot flashes (she thinks this is menopause), diarrhea (last week, has resolved) stable.  Triage Disposition: See Physician Within 24 Hours  Patient/caregiver understands and will follow disposition?: Yes          Copied from CRM #[id number]. Topic: Clinical - Red Word Triage >> May 14, 2024  2:25 PM Antwanette L wrote: Blurred vision and dizziness  Reason for Disposition  MODERATE pain (e.g., interferes with normal activities or awakens from sleep)  [1] MODERATE dizziness (e.g., interferes with normal activities) AND [2] has been evaluated by doctor (or NP/PA) for this  Answer Assessment - Initial Assessment Questions Called [facility] and spoke with Iris, confirmed okay to scheduled patient with Dr Vicci for 3pm today. Patient aware she needs to go straight to PCP office as check in time is right now.   1. LOCATION: Where does it hurt? (e.g., left, right)     Bilateral flank and lower back. Worse on the left.  2. ONSET: When did the pain start?     June 16th.  3. SEVERITY: How bad is the pain? (e.g., Scale 1-10; mild, moderate, or severe)     8/10, she states it has gradually worsening since she ran out of the Flexeril .  4. PATTERN: Does the pain come and go, or is it constant?      Constant on left side.  5. CAUSE: What do you think is causing the pain?     She was seen in ED on 6/16 and told she has ovarian cysts.  6. OTHER SYMPTOMS:  Do you have any other symptoms? (e.g., fever, abdomen  pain, vomiting, leg weakness, burning with urination, blood in urine)     Patient states she has had some nausea, diarrhea (last week, has resolved). Denies vomiting, fever, burning with urination, blood in urine.  Answer Assessment - Initial Assessment Questions 1. DESCRIPTION: Describe your dizziness.     She states she feels panicky or lightheaded if she goes out and is around people. She states this week she has been waking up with it.  2. LIGHTHEADED: Do you feel lightheaded? (e.g., somewhat faint, woozy, weak upon standing)     Yes.  3. VERTIGO: Do you feel like either you or the room is spinning or tilting? (i.e., vertigo)     She states it feels like she is spinning.  4. SEVERITY: How bad is it?  Do you feel like you are going to faint? Can you stand and walk?     Patient states she stepped the wrong way and lost her balance. She denies any falls or requiring assistance for walking.  5. ONSET:  When did the dizziness begin?     Since June 20th. Intermittent.  6. AGGRAVATING FACTORS: Does anything make it worse? (e.g., standing, change in head position)     Going out in public, she states when she looks up at people then she notices the blurry vision happens.  7. HEART RATE: Can you tell me your heart rate? How many beats in 15 seconds?  (Note: Not all patients can do this.)  She states it feels fast right now, but has not checked.  8. CAUSE: What do you think is causing the dizziness? (e.g., decreased fluids or food, diarrhea, emotional distress, heat exposure, new medicine, sudden standing, vomiting; unknown)     She states the ED told her they think it is anxiety. She states sometimes when it happens it feels like her heart is beating fast in her chest.  9. RECURRENT SYMPTOM: Have you had dizziness before? If Yes, ask: When was the last time? What happened that time?     Yes, June 20th went to ED for dizziness and blurred vision and was given  hydroxyzine  which helped but she has run out about 2 weeks ago.  10. OTHER SYMPTOMS: Do you have any other symptoms? (e.g., fever, chest pain, vomiting, diarrhea, bleeding)       Anxiety, intermittent blurred vision, hot flashes. Patient denies unilateral numbness or weakness, changes to speech, facial droop, LOC.  11. PREGNANCY: Is there any chance you are pregnant? When was your last menstrual period?       LMP: she states she has not had a menstrual period in 4 months.  Protocols used: Dizziness - Lightheadedness-A-AH, Flank Pain-A-AH

## 2024-05-14 NOTE — Assessment & Plan Note (Signed)
 Denies formal diagnosis of bipolar- but has carried it per chart since 2012. Will start her on seroquel  at bedtime and recheck in 1-2 weeks with PCP.

## 2024-05-14 NOTE — Progress Notes (Signed)
 BP 132/89   Pulse 83   Temp 98.2 F (36.8 C) (Oral)   Ht 5' 3 (1.6 m)   Wt 188 lb 12.8 oz (85.6 kg)   SpO2 98%   BMI 33.44 kg/m    Subjective:    Patient ID: Briana Lyons, female    DOB: 05-06-1981, 43 y.o.   MRN: [id number]  HPI: Briana Lyons is a 43 y.o. female  Chief Complaint  Patient presents with   Anxiety   [name] presents today for a sick visit. She has been to the ER 6 x in the last 4 months since her last visit here. She notes that she has been very anxious for the past couple of days. She notes that she feels very weird she notes that she can't slow her brain down and has been very anxious. She has a zoom meeting on Thursday with [facility]. She notes that she has never taken any medicine for anxiety or bipolar except hydroxyzine .   ANXIETY/STRESS Duration: chronic, but worse in the past week or so Status:uncontrolled Anxious mood: yes  Excessive worrying: yes Irritability: yes  Sweating: yes Nausea: no Palpitations:yes Hyperventilation: no Panic attacks: yes Agoraphobia: no  Obscessions/compulsions: no Depressed mood: yes    12/11/2023    2:46 PM 10/13/2023    2:33 PM 10/13/2023    1:44 PM 08/18/2023    3:38 PM 05/22/2023   10:56 AM  Depression screen PHQ 2/9  Decreased Interest 2 1 0 0 1  Down, Depressed, Hopeless 2 1 0 0 1  PHQ - 2 Score 4 2 0 0 2  Altered sleeping 2 2 0 0 0  Tired, decreased energy 1 2 0 0 1  Change in appetite 1 2 0 0 1  Feeling bad or failure about yourself  2 1 0 0 2  Trouble concentrating 1 2 0 0 2  Moving slowly or fidgety/restless 2 1 0 0 1  Suicidal thoughts 3 1 0 0 0  PHQ-9 Score 16 13 0 0 9  Difficult doing work/chores  Somewhat difficult Not difficult at all Not difficult at all Somewhat difficult   Anhedonia: no Weight changes: no Insomnia: yes hard to fall asleep  Hypersomnia: no Fatigue/loss of energy: yes Feelings of worthlessness: no Feelings of guilt: no Impaired concentration/indecisiveness: no Suicidal  ideations: no  Crying spells: no Recent Stressors/Life Changes: no   Relationship problems: no   Family stress: no     Financial stress: no    Job stress: no    Recent death/loss: no  SKIN INFECTION Duration: days Location: R low back History of trauma in area: no Pain: yes Quality: tight and stingy Severity: moderate Redness: yes Swelling: yes Oozing: yes Pus: no Fevers: no Nausea/vomiting: no Status: worse Treatments attempted:none  Tetanus: UTD    Relevant past medical, surgical, family and social history reviewed and updated as indicated. Interim medical history since our last visit reviewed. Allergies and medications reviewed and updated.  Review of Systems  Cardiovascular: Negative.   Skin:  Positive for wound. Negative for color change, pallor and rash.  Psychiatric/Behavioral:  Negative for agitation, behavioral problems, confusion, decreased concentration, dysphoric mood, hallucinations, self-injury, sleep disturbance and suicidal ideas. The patient is nervous/anxious. The patient is not hyperactive.     Per HPI unless specifically indicated above     Objective:    BP 132/89   Pulse 83   Temp 98.2 F (36.8 C) (Oral)   Ht 5' 3 (1.6 m)   Wt  188 lb 12.8 oz (85.6 kg)   SpO2 98%   BMI 33.44 kg/m   Wt Readings from Last 3 Encounters:  05/14/24 188 lb 12.8 oz (85.6 kg)  04/12/24 183 lb (83 kg)  04/08/24 174 lb 2.6 oz (79 kg)    Physical Exam Vitals and nursing note reviewed.  Constitutional:      General: She is not in acute distress.    Appearance: Normal appearance. She is not ill-appearing, toxic-appearing or diaphoretic.  HENT:     Head: Normocephalic and atraumatic.     Right Ear: External ear normal.     Left Ear: External ear normal.     Nose: Nose normal.     Mouth/Throat:     Mouth: Mucous membranes are moist.     Pharynx: Oropharynx is clear.  Eyes:     General: No scleral icterus.       Right eye: No discharge.        Left eye: No  discharge.     Extraocular Movements: Extraocular movements intact.     Conjunctiva/sclera: Conjunctivae normal.     Pupils: Pupils are equal, round, and reactive to light.  Cardiovascular:     Rate and Rhythm: Normal rate and regular rhythm.     Pulses: Normal pulses.     Heart sounds: Normal heart sounds. No murmur heard.    No friction rub. No gallop.  Pulmonary:     Effort: Pulmonary effort is normal. No respiratory distress.     Breath sounds: Normal breath sounds. No stridor. No wheezing, rhonchi or rales.  Chest:     Chest wall: No tenderness.  Musculoskeletal:        General: Normal range of motion.     Cervical back: Normal range of motion and neck supple.  Skin:    General: Skin is warm and dry.     Capillary Refill: Capillary refill takes less than 2 seconds.     Coloration: Skin is not jaundiced or pale.     Findings: Erythema (3 inch swelling and redness in L low back) present. No bruising, lesion or rash.  Neurological:     General: No focal deficit present.     Mental Status: She is alert and oriented to person, place, and time. Mental status is at baseline.  Psychiatric:        Mood and Affect: Mood normal.        Behavior: Behavior normal.        Thought Content: Thought content normal.        Judgment: Judgment normal.     Results for orders placed or performed in visit on 05/14/24  Bayer DCA Hb A1c Waived   Collection Time: 05/14/24  3:51 PM  Result Value Ref Range   HB A1C (BAYER DCA - WAIVED) 5.6 4.8 - 5.6 %      Assessment & Plan:   Problem List Items Addressed This Visit       Other   Anxiety - Primary   Denies formal diagnosis of bipolar- but has carried it per chart since 2012. Will start her on seroquel  at bedtime and recheck in 1-2 weeks with PCP.      Relevant Medications   hydrOXYzine  (ATARAX ) 25 MG tablet   Other Relevant Orders   CBC with Differential/Platelet   TSH   Other Visit Diagnoses       Cellulitis of lower back        Will treat with bactrim - call if  not getting better or getting worse.     Palpitations       Will check zio monitor. Await results.   Relevant Orders   CBC with Differential/Platelet   VITAMIN D  25 Hydroxy (Vit-D Deficiency, Fractures)   B12   Comprehensive metabolic panel with GFR   LONG TERM MONITOR XT (3-14 DAYS)     IFG (impaired fasting glucose)       Will check labs. Await results. Treat as needed.   Relevant Orders   Bayer DCA Hb A1c Waived (Completed)   CBC with Differential/Platelet     Screening for cardiovascular condition       Will check labs. Await results. Treat as needed.   Relevant Orders   Lipid Panel w/o Chol/HDL Ratio     Hepatitis B vaccination status unknown       Will check labs. Await results. Treat as needed.   Relevant Orders   Hepatitis B surface antibody,quantitative        Follow up plan: Return ASAP with PCP.

## 2024-05-15 ENCOUNTER — Ambulatory Visit: Payer: Self-pay | Admitting: Family Medicine

## 2024-05-15 LAB — COMPREHENSIVE METABOLIC PANEL WITH GFR
ALT: 16 IU/L (ref 0–32)
AST: 15 IU/L (ref 0–40)
Albumin: 4.5 g/dL (ref 3.9–4.9)
Alkaline Phosphatase: 103 IU/L (ref 44–121)
BUN/Creatinine Ratio: 15 (ref 9–23)
BUN: 14 mg/dL (ref 6–24)
Bilirubin Total: <0.2 mg/dL (ref 0.0–1.2)
CO2: 23 mmol/L (ref 20–29)
Calcium: 9.9 mg/dL (ref 8.7–10.2)
Chloride: 101 mmol/L (ref 96–106)
Creatinine, Ser: 0.91 mg/dL (ref 0.57–1.00)
Globulin, Total: 2.8 g/dL (ref 1.5–4.5)
Glucose: 93 mg/dL (ref 70–99)
Potassium: 4.4 mmol/L (ref 3.5–5.2)
Sodium: 141 mmol/L (ref 134–144)
Total Protein: 7.3 g/dL (ref 6.0–8.5)
eGFR: 81 mL/min/1.73 (ref >59)

## 2024-05-15 LAB — VITAMIN B12: Vitamin B-12: 560 pg/mL (ref 232–1245)

## 2024-05-15 LAB — CBC WITH DIFFERENTIAL/PLATELET
Basophils Absolute: 0 x10E3/uL (ref 0.0–0.2)
Basos: 0 %
EOS (ABSOLUTE): 0.2 x10E3/uL (ref 0.0–0.4)
Eos: 2 %
Hematocrit: 38.4 % (ref 34.0–46.6)
Hemoglobin: 13 g/dL (ref 11.1–15.9)
Immature Grans (Abs): 0 x10E3/uL (ref 0.0–0.1)
Immature Granulocytes: 0 %
Lymphocytes Absolute: 2.2 x10E3/uL (ref 0.7–3.1)
Lymphs: 24 %
MCH: 30.2 pg (ref 26.6–33.0)
MCHC: 33.9 g/dL (ref 31.5–35.7)
MCV: 89 fL (ref 79–97)
Monocytes Absolute: 0.6 x10E3/uL (ref 0.1–0.9)
Monocytes: 7 %
Neutrophils Absolute: 6.2 x10E3/uL (ref 1.4–7.0)
Neutrophils: 67 %
Platelets: 374 x10E3/uL (ref 150–450)
RBC: 4.31 x10E6/uL (ref 3.77–5.28)
RDW: 13.8 % (ref 11.7–15.4)
WBC: 9.2 x10E3/uL (ref 3.4–10.8)

## 2024-05-15 LAB — LIPID PANEL W/O CHOL/HDL RATIO
Cholesterol, Total: 198 mg/dL (ref 100–199)
HDL: 45 mg/dL (ref >39)
LDL Chol Calc (NIH): 117 mg/dL — ABNORMAL HIGH (ref 0–99)
Triglycerides: 203 mg/dL — ABNORMAL HIGH (ref 0–149)
VLDL Cholesterol Cal: 36 mg/dL (ref 5–40)

## 2024-05-15 LAB — VITAMIN D 25 HYDROXY (VIT D DEFICIENCY, FRACTURES): Vit D, 25-Hydroxy: 18.6 ng/mL — ABNORMAL LOW (ref 30.0–100.0)

## 2024-05-15 LAB — HEPATITIS B SURFACE ANTIBODY, QUANTITATIVE: Hepatitis B Surf Ab Quant: <3.5 m[IU]/mL — ABNORMAL LOW

## 2024-05-15 LAB — TSH: TSH: 1.94 u[IU]/mL (ref 0.450–4.500)

## 2024-05-15 MED ORDER — VITAMIN D (ERGOCALCIFEROL) 1.25 MG (50000 UNIT) PO CAPS
50000.0000 [IU] | ORAL_CAPSULE | ORAL | 1 refills | Status: DC
Start: 2024-05-15 — End: 2024-07-11

## 2024-05-16 NOTE — Telephone Encounter (Signed)
 Called to pharmacy. Patients tailored care plan [facility] is requesting a prior auth. I have informed Nysha and see is ok to wait until this is completed. I then sent a staff message to the prior auth pharmacy team requesting assistance.

## 2024-05-16 NOTE — Telephone Encounter (Signed)
 Copied from CRM [id number]. Topic: Clinical - Prescription Issue >> May 16, 2024  2:19 PM [name] wrote: Reason for CRM: Patient is calling in because she was prescribed QUEtiapine  (SEROQUEL ) 25 MG tablet [[id number]] and it is too expensive and she wants to know if she can be prescribed something else.   Please advise?

## 2024-05-16 NOTE — Telephone Encounter (Addendum)
 This is a preferred drug on medicaid. Can we please find out what's going on?

## 2024-05-17 ENCOUNTER — Encounter: Payer: Self-pay | Admitting: Emergency Medicine

## 2024-05-17 ENCOUNTER — Ambulatory Visit: Payer: Self-pay

## 2024-05-17 ENCOUNTER — Other Ambulatory Visit: Payer: Self-pay

## 2024-05-17 ENCOUNTER — Emergency Department
Admission: EM | Admit: 2024-05-17 | Discharge: 2024-05-17 | Disposition: A | Discharge status: Home / Self Care | Payer: MEDICAID

## 2024-05-17 DIAGNOSIS — F419 Anxiety disorder, unspecified: Secondary | ICD-10-CM | POA: Diagnosis present

## 2024-05-17 MED ORDER — MELATONIN 3 MG PO CAPS: 3.0000 mg | ORAL_CAPSULE | Freq: Every evening | ORAL | 0 refills | Status: AC | PRN

## 2024-05-17 MED ORDER — QUETIAPINE FUMARATE 25 MG PO TABS
25.0000 mg | ORAL_TABLET | Freq: Once | ORAL | Status: AC
  Administered 2024-05-17: 25 mg via ORAL
  Filled 2024-05-17: qty 1

## 2024-05-17 MED ORDER — QUETIAPINE FUMARATE 25 MG PO TABS: 25.0000 mg | ORAL_TABLET | Freq: Every day | ORAL | 0 refills | Status: DC

## 2024-05-17 NOTE — Discharge Instructions (Addendum)
 Your evaluation in the emergency department is overall reassuring.  I did prescribe you a shorter course of Seroquel  which hopefully will be more affordable.  It appears your primary care provider is already working on getting the longer term prescription authorized with your insurance provider.  Please do follow-up with your primary care provider as already planned, and return to the emergency department with any new or worsening symptoms.

## 2024-05-17 NOTE — ED Provider Notes (Addendum)
 [facility] Provider Note    Event Date/Time   First MD Initiated Contact with Patient 05/17/24 1438     (approximate)   History   Anxiety  C/O anxiety, feeling anger when waking up. Symptoms x 4 days. Denies SI/HI./  Seen by PCP for same, prescribed Seroquel , but cannot afford RX  AAOx3.  Skin warm and dry. NAD. Calm and cooperative.   HPI Briana Lyons is a 43 y.o. female PMH bipolar 1 disorder, depression, gallstones, chronic calculus cholecystitis, pancreatitis, ovarian cyst presents for evaluation of reported anxiety - Patient notes she has been feeling more anxious than usual over the past several days, has difficulty sleeping due to this.  Is followed by primary care provider and was recently prescribed Seroquel  to help with this though she found it was too expensive so has not filled the prescription. -Denies SI, HI, hallucinations. - Has PMD follow-up next week - Denies any substance use - She is taking Atarax  with intermittent relief  Per chart review, negative pregnancy test on 05/07/2024 and patient tells me she is not sexually active recently or currently.  ECG on 05/08/2024 with QTc 437, normal.     Physical Exam   Triage Vital Signs: ED Triage Vitals [05/17/24 1311]  Encounter Vitals Group     BP (!) 130/91     Girls Systolic BP Percentile      Girls Diastolic BP Percentile      Boys Systolic BP Percentile      Boys Diastolic BP Percentile      Pulse Rate 88     Resp 16     Temp 98 F (36.7 C)     Temp Source Oral     SpO2 99 %     Weight 188 lb 11.4 oz (85.6 kg)     Height      Head Circumference      Peak Flow      Pain Score 0     Pain Loc      Pain Education      Exclude from Growth Chart     Most recent vital signs: Vitals:   05/17/24 1311  BP: (!) 130/91  Pulse: 88  Resp: 16  Temp: 98 F (36.7 C)  SpO2: 99%     General: Awake, no distress.  CV:  Good peripheral perfusion. RRR, RP 2+ Resp:  Normal effort.  CTAB Abd:  No distention. Nontender to deep palpation throughout Psych:  Denies SI, HI, hallucinations.  Endorses anxiety.   ED Results / Procedures / Treatments   Labs (all labs ordered are listed, but only abnormal results are displayed) Labs Reviewed - No data to display   EKG  N/a   RADIOLOGY N/a    PROCEDURES:  Critical Care performed: No  Procedures   MEDICATIONS ORDERED IN ED: Medications  QUEtiapine  (SEROQUEL ) tablet 25 mg (25 mg Oral Given 05/17/24 1529)     IMPRESSION / MDM / ASSESSMENT AND PLAN / ED COURSE  I reviewed the triage vital signs and the nursing notes.                              DDX/MDM/AP: Differential diagnosis includes, but is not limited to, exacerbation of patient's known anxiety, does not appear to be psychiatrically decompensated at time of my eval.  Plan: -Give dose of Seroquel  here - No indication for emergent psychiatric evaluation on my eval  Per  chart review, appears patient is primary provider as recently as yesterday is working to get patient's medication covered.  Patient's presentation is most consistent with exacerbation of chronic illness.   ED course below.  No acute pathology today, patient appears stable for outpatient follow-up.  Given single dose of Seroquel  here in emergency department and will prescribe short course of Seroquel  which will hopefully allow patient to financially afford it until preauthorization can be figured out with her insurer, appears outpatient provider is working on this as recently as yesterday per my chart review.  Also Rx melatonin to help with sleep.  Patient amenable to plan.  ED return precautions in place.      FINAL CLINICAL IMPRESSION(S) / ED DIAGNOSES   Final diagnoses:  Anxiety     Rx / DC Orders   ED Discharge Orders          Ordered    QUEtiapine  (SEROQUEL ) 25 MG tablet  Daily at bedtime        05/17/24 1538    Melatonin 3 MG CAPS  At bedtime PRN        05/17/24  1539             Note:  This document was prepared using Dragon voice recognition software and may include unintentional dictation errors.   Clarine Ozell LABOR, MD 05/17/24 1539    Clarine Ozell LABOR, MD 05/17/24 1540

## 2024-05-17 NOTE — Telephone Encounter (Signed)
 FYI Only or Action Required?: Action required by provider: update on patient condition. Patient would also like prescription for valtrex  for mouth sores  Patient was last seen in primary care on 05/14/2024 by Vicci Bouchard P, DO.  Called Nurse Triage reporting anger.  Symptoms began today.  Interventions attempted: Prescription medications: hydroxazine.  Symptoms are: gradually worsening.  Triage Disposition: Go to ED Now (or PCP Triage)  Patient/caregiver understands and will follow disposition?: Yes   Copied from CRM [id number]. Topic: Clinical - Red Word Triage >> May 17, 2024 11:32 AM Carlatta H wrote: [facility] that prompted transfer to Nurse Triage: Patient called due to not feeling herself//she feels angry and depressed//She is upset and crying Reason for Disposition  [1] SEVERE anxiety (e.g., extremely anxious with intense emotional symptoms such as feeling of unreality, urge to flee, unable to calm down; unable to cope or function) AND [2] not better after 10 minutes of reassurance and Care Advice  Answer Assessment - Initial Assessment Questions 1. CONCERN: Did anything happen that prompted you to call today?      Denies any emotional triggers, but states that she is agitated and overwhelmed about not being able to find a job 2. ANXIETY SYMPTOMS: Can you describe how you (your loved one; patient) have been feeling? (e.g., tense, restless, panicky, anxious, keyed up, overwhelmed, sense of impending doom).      Angry, agitated.  Wakes up agitated, easily annoyed.  Feels like she is not herself.  She wants to avoid people and feels like her brain is broken 3. ONSET: How long have you been feeling this way? (e.g., hours, days, weeks)     Three or four days 4. SEVERITY: How would you rate the level of anxiety? (e.g., 0 - 10; or mild, moderate, severe).     severe 5. FUNCTIONAL IMPAIRMENT: How have these feelings affected your ability to do daily activities? Have you had  more difficulty than usual doing your normal daily activities? (e.g., getting better, same, worse; self-care, school, work, interactions)     Difficult to perform self care 6. HISTORY: Have you felt this way before? Have you ever been diagnosed with an anxiety problem in the past? (e.g., generalized anxiety disorder, panic attacks, PTSD). If Yes, ask: How was this problem treated? (e.g., medicines, counseling, etc.)     No history of these feelings 7. RISK OF HARM - SUICIDAL IDEATION: Do you ever have thoughts of hurting or killing yourself? If Yes, ask:  Do you have these feelings now? Do you have a plan on how you would do this?     States that she has had thoughts, but would not do that and does not want to die 8. TREATMENT:  What has been done so far to treat this anxiety? (e.g., medicines, relaxation strategies). What has helped?     Seroquel , has not picked up, hydroxazine, is not helping 9. THERAPIST: Do you have a counselor or therapist? If Yes, ask: What is their name?     Trying to find therapist, but has not had a counselor or therapist in the past 10. POTENTIAL TRIGGERS: Do you drink caffeinated beverages (e.g., coffee, colas, teas), and how much daily? Do you drink alcohol or use any drugs? Have you started any new medicines recently?       Hydroxazine, states that it is not helping 11. PATIENT SUPPORT: Who is with you now? Who do you live with? Do you have family or friends who you can talk to?  Lives with Aunt, but does not have a good relationship. otherwise does not have anyone to reach out to 12. OTHER SYMPTOMS: Do you have any other symptoms? (e.g., feeling depressed, trouble concentrating, trouble sleeping, trouble breathing, palpitations or fast heartbeat, chest pain, sweating, nausea, or diarrhea)       Reports herpes outbreak on her mouth  Patient states that her Aunt cannot drive her to [facility].  Phone numbers  provided for: Behavioral Health Help [phone] or [phone], Text the word HOME or RISE to [phone], and a live person will be available to chat or call them  Protocols used: Anxiety and Panic Attack-A-AH

## 2024-05-17 NOTE — ED Triage Notes (Signed)
 C/O anxiety, feeling anger when waking up. Symptoms x 4 days. Denies SI/HI./  Seen by PCP for same, prescribed Seroquel , but cannot afford RX  AAOx3.  Skin warm and dry. NAD. Calm and cooperative.

## 2024-05-20 MED ORDER — VALACYCLOVIR HCL 1 G PO TABS: 1000.0000 mg | ORAL_TABLET | Freq: Two times a day (BID) | ORAL | 0 refills | Status: AC

## 2024-05-20 NOTE — Telephone Encounter (Signed)
 Called and notified patient of Karen's message. Patient verbalized understanding.

## 2024-05-20 NOTE — Telephone Encounter (Signed)
 Prescription sent to the pharmacy for valacyclovir .  Recommend keeping her appointment for Friday.  If symptoms worse, she should be seen in the ER for evaluation and management.

## 2024-05-21 ENCOUNTER — Other Ambulatory Visit (HOSPITAL_COMMUNITY): Payer: Self-pay

## 2024-05-21 ENCOUNTER — Telehealth: Payer: Self-pay

## 2024-05-21 NOTE — Telephone Encounter (Signed)
 Pharmacy Patient Advocate Encounter   Received notification from CoverMyMeds that prior authorization for QUEtiapine  Fumarate 25MG  tablets is required/requested.   Insurance verification completed.   The patient is insured through [employer] .   Per test claim: PA required; PA submitted to above mentioned insurance via CoverMyMeds Key/confirmation #/EOC [facility] Status is pending

## 2024-05-22 ENCOUNTER — Other Ambulatory Visit (HOSPITAL_COMMUNITY): Payer: Self-pay

## 2024-05-23 NOTE — Telephone Encounter (Signed)
 Pharmacy Patient Advocate Encounter  Received notification from [facility] that Prior Authorization for QUEtiapine  Fumarate 25MG  tablets has been APPROVED from 05/21/2024 to 05/21/2025   PA #/Case ID/Reference #: [id number]

## 2024-05-24 ENCOUNTER — Encounter: Payer: Self-pay | Admitting: Nurse Practitioner

## 2024-05-24 ENCOUNTER — Ambulatory Visit (INDEPENDENT_AMBULATORY_CARE_PROVIDER_SITE_OTHER): Payer: MEDICAID | Admitting: Nurse Practitioner

## 2024-05-24 VITALS — BP 117/76 | HR 88 | Temp 99.4°F | Ht 63.0 in | Wt 193.4 lb

## 2024-05-24 DIAGNOSIS — B3741 Candidal cystitis and urethritis: Secondary | ICD-10-CM | POA: Diagnosis not present

## 2024-05-24 DIAGNOSIS — F3160 Bipolar disorder, current episode mixed, unspecified: Secondary | ICD-10-CM | POA: Diagnosis not present

## 2024-05-24 DIAGNOSIS — R5383 Other fatigue: Secondary | ICD-10-CM

## 2024-05-24 DIAGNOSIS — M545 Low back pain, unspecified: Secondary | ICD-10-CM

## 2024-05-24 LAB — URINALYSIS, ROUTINE W REFLEX MICROSCOPIC
Bilirubin, UA: NEGATIVE
Glucose, UA: NEGATIVE
Ketones, UA: NEGATIVE
Nitrite, UA: NEGATIVE
Protein,UA: NEGATIVE
Specific Gravity, UA: 1.02 (ref 1.005–1.030)
Urobilinogen, Ur: 0.2 mg/dL (ref 0.2–1.0)
pH, UA: 5.5 (ref 5.0–7.5)

## 2024-05-24 LAB — MICROSCOPIC EXAMINATION: Bacteria, UA: NONE SEEN

## 2024-05-24 MED ORDER — CYCLOBENZAPRINE HCL 5 MG PO TABS: 5.0000 mg | ORAL_TABLET | Freq: Three times a day (TID) | ORAL | 1 refills | Status: DC | PRN

## 2024-05-24 MED ORDER — QUETIAPINE FUMARATE 25 MG PO TABS: 25.0000 mg | ORAL_TABLET | Freq: Every day | ORAL | 0 refills | Status: DC

## 2024-05-24 MED ORDER — FLUCONAZOLE 150 MG PO TABS: 150.0000 mg | ORAL_TABLET | Freq: Once | ORAL | 0 refills | Status: AC

## 2024-05-24 NOTE — Assessment & Plan Note (Signed)
 Chronic.  Controlled.  Continue with current medication regimen of Seroquel  25mg . Refills sent today. Return to clinic in 3 months for reevaluation.  Call sooner if concerns arise.

## 2024-05-24 NOTE — Progress Notes (Signed)
 BP 117/76   Pulse 88   Temp 99.4 F (37.4 C) (Oral)   Ht 5' 3 (1.6 m)   Wt 193 lb 6.4 oz (87.7 kg)   SpO2 97%   BMI 34.26 kg/m    Subjective:    Patient ID: Briana Lyons, female    DOB: 04-14-1981, 43 y.o.   MRN: [id number]  HPI: Briana Lyons is a 43 y.o. female  Chief Complaint  Patient presents with   Pain    Patient states she has been having pain in the lower left side of her back. States she has been in pain since she was seen at the ER in June. Currently rating pain at a 7.    BACK PAIN Patient states she is having left lowe back pain on and off since 04/08/24.  Imaging in the ER was unremarkable- other than ovarian cysts.  Hurts with movement.  Tried muscle relaxer's and it did help her pain.    ANXIETY Patient states the Seroquel  is working well for her.  Feels like she is able to concentrate and think better.  Her anxiety has been a lot better.  She hasn't even had to take any hydroxyzine .    Relevant past medical, surgical, family and social history reviewed and updated as indicated. Interim medical history since our last visit reviewed. Allergies and medications reviewed and updated.  Review of Systems  Musculoskeletal:  Positive for back pain.  Psychiatric/Behavioral:  The patient is nervous/anxious.     Per HPI unless specifically indicated above     Objective:    BP 117/76   Pulse 88   Temp 99.4 F (37.4 C) (Oral)   Ht 5' 3 (1.6 m)   Wt 193 lb 6.4 oz (87.7 kg)   SpO2 97%   BMI 34.26 kg/m   Wt Readings from Last 3 Encounters:  05/24/24 193 lb 6.4 oz (87.7 kg)  05/17/24 188 lb 11.4 oz (85.6 kg)  05/14/24 188 lb 12.8 oz (85.6 kg)    Physical Exam Vitals and nursing note reviewed.  Constitutional:      General: She is not in acute distress.    Appearance: Normal appearance. She is normal weight. She is not ill-appearing, toxic-appearing or diaphoretic.  HENT:     Head: Normocephalic.     Right Ear: External ear normal.     Left Ear: External  ear normal.     Nose: Nose normal.     Mouth/Throat:     Mouth: Mucous membranes are moist.     Pharynx: Oropharynx is clear.  Eyes:     General:        Right eye: No discharge.        Left eye: No discharge.     Extraocular Movements: Extraocular movements intact.     Conjunctiva/sclera: Conjunctivae normal.     Pupils: Pupils are equal, round, and reactive to light.  Cardiovascular:     Rate and Rhythm: Normal rate and regular rhythm.     Heart sounds: No murmur heard. Pulmonary:     Effort: Pulmonary effort is normal. No respiratory distress.     Breath sounds: Normal breath sounds. No wheezing or rales.  Musculoskeletal:     Cervical back: Normal range of motion and neck supple.  Skin:    General: Skin is warm and dry.     Capillary Refill: Capillary refill takes less than 2 seconds.  Neurological:     General: No focal deficit present.  Mental Status: She is alert and oriented to person, place, and time. Mental status is at baseline.  Psychiatric:        Mood and Affect: Mood normal.        Behavior: Behavior normal.        Thought Content: Thought content normal.        Judgment: Judgment normal.     Results for orders placed or performed in visit on 05/14/24  Bayer DCA Hb A1c Waived   Collection Time: 05/14/24  3:51 PM  Result Value Ref Range   HB A1C (BAYER DCA - WAIVED) 5.6 4.8 - 5.6 %  CBC with Differential/Platelet   Collection Time: 05/14/24  3:54 PM  Result Value Ref Range   WBC 9.2 3.4 - 10.8 x10E3/uL   RBC 4.31 3.77 - 5.28 x10E6/uL   Hemoglobin 13.0 11.1 - 15.9 g/dL   Hematocrit 61.5 65.9 - 46.6 %   MCV 89 79 - 97 fL   MCH 30.2 26.6 - 33.0 pg   MCHC 33.9 31.5 - 35.7 g/dL   RDW 86.1 88.2 - 84.5 %   Platelets 374 150 - 450 x10E3/uL   Neutrophils 67 Not Estab. %   Lymphs 24 Not Estab. %   Monocytes 7 Not Estab. %   Eos 2 Not Estab. %   Basos 0 Not Estab. %   Neutrophils Absolute 6.2 1.4 - 7.0 x10E3/uL   Lymphocytes Absolute 2.2 0.7 - 3.1  x10E3/uL   Monocytes Absolute 0.6 0.1 - 0.9 x10E3/uL   EOS (ABSOLUTE) 0.2 0.0 - 0.4 x10E3/uL   Basophils Absolute 0.0 0.0 - 0.2 x10E3/uL   Immature Granulocytes 0 Not Estab. %   Immature Grans (Abs) 0.0 0.0 - 0.1 x10E3/uL  Lipid Panel w/o Chol/HDL Ratio   Collection Time: 05/14/24  3:54 PM  Result Value Ref Range   Cholesterol, Total 198 100 - 199 mg/dL   Triglycerides 796 (H) 0 - 149 mg/dL   HDL 45 >60 mg/dL   VLDL Cholesterol Cal 36 5 - 40 mg/dL   LDL Chol Calc (NIH) 882 (H) 0 - 99 mg/dL  TSH   Collection Time: 05/14/24  3:54 PM  Result Value Ref Range   TSH 1.940 0.450 - 4.500 uIU/mL  VITAMIN D  25 Hydroxy (Vit-D Deficiency, Fractures)   Collection Time: 05/14/24  3:54 PM  Result Value Ref Range   Vit D, 25-Hydroxy 18.6 (L) 30.0 - 100.0 ng/mL  B12   Collection Time: 05/14/24  3:54 PM  Result Value Ref Range   Vitamin B-12 560 232 - 1,245 pg/mL  Comprehensive metabolic panel with GFR   Collection Time: 05/14/24  3:54 PM  Result Value Ref Range   Glucose 93 70 - 99 mg/dL   BUN 14 6 - 24 mg/dL   Creatinine, Ser 9.08 0.57 - 1.00 mg/dL   eGFR 81 >40 fO/fpw/8.26   BUN/Creatinine Ratio 15 9 - 23   Sodium 141 134 - 144 mmol/L   Potassium 4.4 3.5 - 5.2 mmol/L   Chloride 101 96 - 106 mmol/L   CO2 23 20 - 29 mmol/L   Calcium  9.9 8.7 - 10.2 mg/dL   Total Protein 7.3 6.0 - 8.5 g/dL   Albumin 4.5 3.9 - 4.9 g/dL   Globulin, Total 2.8 1.5 - 4.5 g/dL   Bilirubin Total <9.7 0.0 - 1.2 mg/dL   Alkaline Phosphatase 103 44 - 121 IU/L   AST 15 0 - 40 IU/L   ALT 16 0 - 32 IU/L  Hepatitis B surface antibody,quantitative   Collection Time: 05/14/24  3:54 PM  Result Value Ref Range   Hepatitis B Surf Ab Quant <3.5 (L) Immunity>10 mIU/mL      Assessment & Plan:   Problem List Items Addressed This Visit       Other   Bipolar disorder, unspecified (HCC) - Primary   Chronic.  Controlled.  Continue with current medication regimen of Seroquel  25mg . Refills sent today. Return to clinic in  3 months for reevaluation.  Call sooner if concerns arise.        Other Visit Diagnoses       Acute left-sided low back pain, unspecified whether sciatica present       Reivewed imaging from recent ER visits. No reason seen for pain. Suspect symptoms are musculoskeletal.  WIll treat with cyclobenzaprine .FU if not improved.   Relevant Medications   cyclobenzaprine  (FLEXERIL ) 5 MG tablet   Other Relevant Orders   Urinalysis, Routine w reflex microscopic     Other fatigue       Patient would like her iron checked in order to donate plasma.  Labs ordered at visit today.   Relevant Orders   Iron, TIBC and Ferritin Panel        Follow up plan: Return in about 3 months (around 08/24/2024) for Depression/Anxiety FU.

## 2024-05-24 NOTE — Addendum Note (Signed)
 Addended by: MELVIN PAO on: 05/24/2024 12:13 PM   Modules accepted: Orders

## 2024-05-26 LAB — URINE CULTURE

## 2024-05-27 ENCOUNTER — Ambulatory Visit: Payer: Self-pay | Admitting: Nurse Practitioner

## 2024-06-13 ENCOUNTER — Encounter: Payer: Self-pay | Admitting: Advanced Practice Midwife

## 2024-06-13 ENCOUNTER — Ambulatory Visit: Payer: MEDICAID | Admitting: Advanced Practice Midwife

## 2024-06-13 VITALS — BP 123/85 | HR 82 | Resp 16 | Ht 63.0 in | Wt 196.7 lb

## 2024-06-13 DIAGNOSIS — N83202 Unspecified ovarian cyst, left side: Secondary | ICD-10-CM | POA: Diagnosis not present

## 2024-06-13 DIAGNOSIS — N83201 Unspecified ovarian cyst, right side: Secondary | ICD-10-CM | POA: Diagnosis not present

## 2024-06-13 DIAGNOSIS — G8929 Other chronic pain: Secondary | ICD-10-CM

## 2024-06-13 DIAGNOSIS — M545 Low back pain, unspecified: Secondary | ICD-10-CM | POA: Diagnosis not present

## 2024-06-13 NOTE — Progress Notes (Unsigned)
 Patient ID: Briana Lyons, female   DOB: [birth year], 43 y.o.   MRN: [id number]  Reason for Consult: No chief complaint on file.   Subjective:  Date of Service: 06/13/2024  HPI:  Briana Lyons is a 43 y.o. female who presents for follow up from ED visit on 04/08/24. She was seen for left flank and left lower back pain. Final diagnoses were left flank pain, acute left sided low back pain without sciatica, cysts of both ovaries.   Today she reports some sharp pain in her left ovary. Discussed can repeat imaging in several months for comparison if symptoms continue or worsen. 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 She would appreciate an orthopedics referral.  She has swelling around her mouth and is unsure if she is having an allergic reaction or possibly an outbreak of herpes. Has had a similar reaction about 5 times in the past 2 years. 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 She does have Rx for Valtrex  most recently prescribed in July of this year. No apparent blisters. Area is swollen, red and reportedly painful. She is wearing a mask. She reports a lot of stress in her life and is trying to see a therapist. Has recently been prescribed Seroquel  for bipolar disorder.   Past Medical History:  Diagnosis Date   Bipolar 1 disorder (HCC)    CCC (chronic calculous cholecystitis) 06/20/2023   Depression    Gallstones 05/16/2023   GERD (gastroesophageal reflux disease)    Herpes    Ovarian cyst    Pancreatitis 09/02/2023   No family history on file. Past Surgical History:  Procedure Laterality Date   APPENDECTOMY     CESAREAN SECTION     TUBAL LIGATION      Short Social History:  Social History   Tobacco Use   Smoking status: Every Day    Current packs/day: 0.50    Average packs/day: 0.5 packs/day for 24.0 years (12.0 ttl pk-yrs)    Types: Cigarettes    Passive exposure: Past    Smokeless tobacco: Never  Substance Use Topics   Alcohol use: Yes    Comment: occ    Allergies  Allergen Reactions   Codeine Nausea And Vomiting   Tylenol  With Codeine #3 [Acetaminophen -Codeine]     Current Outpatient Medications  Medication Sig Dispense Refill   cyclobenzaprine  (FLEXERIL ) 5 MG tablet Take 1 tablet (5 mg total) by mouth 3 (three) times daily as needed for muscle spasms. 30 tablet 1   hydrOXYzine  (ATARAX ) 25 MG tablet Take 1 tablet (25 mg total) by mouth 2 (two) times daily as needed for anxiety. 20 tablet 0   lidocaine  (LIDODERM ) 5 % Place 1 patch onto the skin every 12 (twelve) hours. Remove & Discard patch within 12 hours or as directed by MD 10 patch 0   Melatonin 3 MG CAPS Take 1 capsule (3 mg total) by mouth at bedtime as needed. 30 capsule 0   omeprazole  (PRILOSEC) 40 MG capsule TAKE 1 CAPSULE (40 MG TOTAL) BY MOUTH 2 (TWO) TIMES DAILY BEFORE A MEAL. 180 capsule 0   QUEtiapine  (SEROQUEL ) 25 MG tablet Take 1 tablet (25 mg total) by mouth at bedtime. 90 tablet 0   Vitamin D , Ergocalciferol , (DRISDOL ) 1.25 MG (50000 UNIT) CAPS capsule Take 1 capsule (50,000 Units total) by mouth every 7 (seven) days. (Patient not taking: Reported on 05/24/2024) 12 capsule  1   No current facility-administered medications for this visit.    Review of Systems  Constitutional:  Negative for chills and fever.  HENT:  Negative for congestion, ear discharge, ear pain, hearing loss, sinus pain and sore throat.   Eyes:  Negative for blurred vision and double vision.  Respiratory:  Negative for cough, shortness of breath and wheezing.   Cardiovascular:  Negative for chest pain, palpitations and leg swelling.  Gastrointestinal:  Negative for abdominal pain, blood in stool, constipation, diarrhea, heartburn, melena, nausea and vomiting.  Genitourinary:  Negative for dysuria, flank pain, frequency, hematuria and urgency.  Musculoskeletal:  Positive for back pain. Negative for joint pain and  myalgias.  Skin:  Negative for itching and rash.       Positive for swelling/pain/redness around mouth  Neurological:  Negative for dizziness, tingling, tremors, sensory change, speech change, focal weakness, seizures, loss of consciousness, weakness and headaches.  Endo/Heme/Allergies:  Negative for environmental allergies. Does not bruise/bleed easily.  Psychiatric/Behavioral:  Positive for depression. Negative for hallucinations, memory loss, substance abuse and suicidal ideas. The patient is not nervous/anxious and does not have insomnia.        Positive for anxiety       Objective:   Vital Signs: BP 123/85   Pulse 82   Resp 16   Ht 5' 3 (1.6 m)   Wt 196 lb 11.2 oz (89.2 kg)   LMP  (LMP Unknown)   BMI 34.84 kg/m  Constitutional: Well nourished, well developed female in mild emotional acute distress.  HEENT: normal Skin: Warm and dry.  Cardiovascular: Regular rate and rhythm.   Respiratory:  Normal respiratory effort Back: no CVAT Psych: Alert and Oriented x3. No memory deficits. Normal mood and affect.    Assessment/Plan:     43 y.o. female follow up from ER for ovarian cysts, ongoing back pain and swollen lips  Pelvic ultrasound in 3-6 months as needed for evaluation of cysts Referral to Orthopedics for back pain Continue taking valtrex  for swelling around mouth, can use hydrocortisone cream topically as needed   Slater Rains, CNM [facility] 06/14/2024 7:11 PM

## 2024-06-22 DIAGNOSIS — R002 Palpitations: Secondary | ICD-10-CM | POA: Diagnosis not present

## 2024-07-05 ENCOUNTER — Telehealth: Payer: Self-pay | Admitting: Nurse Practitioner

## 2024-07-05 ENCOUNTER — Telehealth: Payer: Self-pay

## 2024-07-05 NOTE — Telephone Encounter (Signed)
 Copied from CRM #[id number]. Topic: Clinical - Lab/Test Results >> Jul 05, 2024  2:58 PM Delon DASEN wrote: Reason for CRM: calling for results of .1-3 Lead EKG Interpretation- please call [phone]

## 2024-07-05 NOTE — Telephone Encounter (Signed)
 Copied from CRM #[id number]. Topic: Clinical - Medication Refill >> Jul 05, 2024  2:52 PM Delon T wrote: Medication: valACYclovir  (VALTREX ) 1000 MG tablet  hydrOXYzine  (ATARAX ) 25 MG tablet  Has the patient contacted their pharmacy? No (Agent: If no, request that the patient contact the pharmacy for the refill. If patient does not wish to contact the pharmacy document the reason why and proceed with request.) (Agent: If yes, when and what did the pharmacy advise?)  This is the patient's preferred pharmacy:  [id number] - [place], [place] - [address] [place] [address] Phone: [phone] Fax: [phone]  Is this the correct pharmacy for this prescription? Yes If no, delete pharmacy and type the correct one.   Has the prescription been filled recently? Yes  Is the patient out of the medication? Yes  Has the patient been seen for an appointment in the last year OR does the patient have an upcoming appointment? Yes  Can we respond through MyChart? Yes  Agent: Please be advised that Rx refills may take up to 3 business days. We ask that you follow-up with your pharmacy.

## 2024-07-05 NOTE — Telephone Encounter (Signed)
 Copied from CRM #[id number]. Topic: Clinical - Medication Question >> Jul 05, 2024  2:56 PM Delon DASEN wrote: Reason for CRM: Patient asking for higher does of hydrOXYzine  (ATARAX ) >> Jul 05, 2024  2:59 PM Delon T wrote: Also asking for something for pain

## 2024-07-09 ENCOUNTER — Ambulatory Visit: Payer: Self-pay

## 2024-07-09 MED ORDER — HYDROXYZINE PAMOATE 50 MG PO CAPS: 50.0000 mg | ORAL_CAPSULE | Freq: Three times a day (TID) | ORAL | 0 refills | Status: DC | PRN

## 2024-07-09 NOTE — Telephone Encounter (Signed)
 FYI Only or Action Required?: FYI only for provider.  Patient was last seen in primary care on 05/24/2024 by Melvin Pao, NP.  Called Nurse Triage reporting Groin Pain.  Symptoms began a week ago.  Interventions attempted: Nothing.  Symptoms are: unchanged.  Triage Disposition: See PCP When Office is Open (Within 3 Days)  Patient/caregiver understands and will follow disposition?: Yes       Copied from CRM [id number]. Topic: Clinical - Red Word Triage >> Jul 09, 2024  3:55 PM Briana Lyons wrote: [facility] that prompted transfer to Nurse Triage: Patient states she is having bad pain and numbness in groin area, she is out of her Valtrex  and having painful flare ups Reason for Disposition  Cold sore last > 2 weeks  Answer Assessment - Initial Assessment Questions 1. LOCATION: Where is the mouth sore (ulcer) located?      Bottom of mouth 2. NUMBER: How many sores are there? :     1 3. SIZE: How large is the sore?  (e.g., size of an apple seed, watermelon seed, pencil eraser)     *No Answer* 4. PAIN: Are they painful? If Yes, ask: How bad is it?  (Scale 0-10; or none, mild, moderate, severe)     yes 5. ONSET: When did you first notice the sore?      X 1 week 6. RECURRENT SYMPTOM: Have you had a mouth ulcer before? If Yes, ask: When was the last time? and What happened that time?      Yes, endorses taking Valtrex  with relief of sx 7. CAUSE: What do you think is causing the mouth sore?     Thinks herpes related and requesting Valtrex  8. OTHER SYMPTOMS: Do you have any other symptoms? (e.g., fever, swollen lymph node)     Endorses going thru menopause and life stress 9. PREGNANCY: Is there any chance you are pregnant? When was your last menstrual period?     Irregular periods, last march, thinks going thru menopause  Protocols used: Mouth Ulcers-A-AH, Cold Sores (Fever Blisters)-A-AH

## 2024-07-09 NOTE — Telephone Encounter (Signed)
 Hydroxyzine  50mg  sent into the pharmacy.  I do not have Valacyclovir  on her medication list.  Please find out what she is taking this for.   Please find out more information regarding her pain.

## 2024-07-09 NOTE — Telephone Encounter (Signed)
 Please call and get more information.  I am not sure what the patient is talking about.  She did have and EKG while she was in the ER in July.

## 2024-07-11 ENCOUNTER — Encounter: Payer: Self-pay | Admitting: Nurse Practitioner

## 2024-07-11 ENCOUNTER — Ambulatory Visit (INDEPENDENT_AMBULATORY_CARE_PROVIDER_SITE_OTHER): Payer: MEDICAID | Admitting: Nurse Practitioner

## 2024-07-11 ENCOUNTER — Ambulatory Visit: Payer: Self-pay

## 2024-07-11 VITALS — BP 133/85 | HR 86 | Temp 98.9°F | Ht 63.0 in | Wt 201.4 lb

## 2024-07-11 DIAGNOSIS — N760 Acute vaginitis: Secondary | ICD-10-CM | POA: Diagnosis not present

## 2024-07-11 DIAGNOSIS — B001 Herpesviral vesicular dermatitis: Secondary | ICD-10-CM

## 2024-07-11 DIAGNOSIS — R002 Palpitations: Secondary | ICD-10-CM

## 2024-07-11 DIAGNOSIS — B379 Candidiasis, unspecified: Secondary | ICD-10-CM

## 2024-07-11 DIAGNOSIS — R300 Dysuria: Secondary | ICD-10-CM

## 2024-07-11 DIAGNOSIS — B9689 Other specified bacterial agents as the cause of diseases classified elsewhere: Secondary | ICD-10-CM

## 2024-07-11 DIAGNOSIS — R8290 Unspecified abnormal findings in urine: Secondary | ICD-10-CM

## 2024-07-11 LAB — MICROSCOPIC EXAMINATION: Bacteria, UA: NONE SEEN

## 2024-07-11 LAB — URINALYSIS, ROUTINE W REFLEX MICROSCOPIC
Bilirubin, UA: NEGATIVE
Glucose, UA: NEGATIVE
Ketones, UA: NEGATIVE
Leukocytes,UA: NEGATIVE
Nitrite, UA: NEGATIVE
Protein,UA: NEGATIVE
Specific Gravity, UA: <1.005 — ABNORMAL LOW (ref 1.005–1.030)
Urobilinogen, Ur: 0.2 mg/dL (ref 0.2–1.0)
pH, UA: 6 (ref 5.0–7.5)

## 2024-07-11 LAB — WET PREP FOR TRICH, YEAST, CLUE
Clue Cell Exam: POSITIVE — AB
Trichomonas Exam: NEGATIVE
Yeast Exam: POSITIVE — AB

## 2024-07-11 MED ORDER — HYDROXYZINE PAMOATE 50 MG PO CAPS: 50.0000 mg | ORAL_CAPSULE | Freq: Three times a day (TID) | ORAL | 2 refills | Status: DC | PRN

## 2024-07-11 MED ORDER — VALACYCLOVIR HCL 500 MG PO TABS
500.0000 mg | ORAL_TABLET | Freq: Every day | ORAL | 1 refills | Status: AC
Start: 2024-07-11 — End: ?

## 2024-07-11 MED ORDER — FLUCONAZOLE 150 MG PO TABS: 150.0000 mg | ORAL_TABLET | Freq: Once | ORAL | 0 refills | Status: AC

## 2024-07-11 MED ORDER — METRONIDAZOLE 500 MG PO TABS: 500.0000 mg | ORAL_TABLET | Freq: Two times a day (BID) | ORAL | 0 refills | Status: AC

## 2024-07-11 MED ORDER — CYCLOBENZAPRINE HCL 5 MG PO TABS: 5.0000 mg | ORAL_TABLET | Freq: Three times a day (TID) | ORAL | 1 refills | Status: DC | PRN

## 2024-07-11 NOTE — Telephone Encounter (Signed)
 Pt called in wanting to check her insurance to make sure it was still in effect prior to coming to her appt today.  Attempted to call [employer]: no answer.  Informed pt she needs to be evaluated by provider asap: pt agreed to come to appt and find out the co-pay if any before cancelling the appt.   FYI Only or Action Required?: FYI only for provider.  Patient was last seen in primary care on 05/24/2024 by Melvin Pao, NP.  Called Nurse Triage reporting Palpitations.  Symptoms began today.  Interventions attempted: Nothing.  Symptoms are: stable.  Triage Disposition: See HCP Within 4 Hours (Or PCP Triage)  Patient/caregiver understands and will follow disposition?: Yes   Copied from CRM [id number]. Topic: Clinical - Red Word Triage >> Jul 11, 2024 12:19 PM Carla L wrote: Red Word that prompted transfer to Nurse Triage: accelerated heart rate   Still having pain in groin area needing to r/s appointment Reason for Disposition  [1] Heart beating very rapidly (e.g., > 140 / minute) AND [2] NOT present now  (Exception: Only during exercise.)  Answer Assessment - Initial Assessment Questions 1. DESCRIPTION: Please describe your heart rate or heartbeat that you are having (e.g., fast/slow, regular/irregular, skipped or extra beats, palpitations)     Heart beating, pounding fast at rest 2. ONSET: When did it start? (e.g., minutes, hours, days)      today 3. DURATION: How long does it last (e.g., seconds, minutes, hours)     30 seconds 4. PATTERN Does it come and go, or has it been constant since it started?  Does it get worse with exertion?   Are you feeling it now?     Comes and goes 5. TAP: Using your hand, can you tap out what you are feeling on a chair or table in front of you, so that I can hear? Note: Not all patients can do this.       na 6. HEART RATE: Can you tell me your heart rate? How many beats in 15 seconds?  Note: Not all patients can do this.        na 7. RECURRENT SYMPTOM: Have you ever had this before? If Yes, ask: When was the last time? and What happened that time?      no 8. CAUSE: What do you think is causing the palpitations?     unknown 9. CARDIAC HISTORY: Do you have any history of heart disease? (e.g., heart attack, angina, bypass surgery, angioplasty, arrhythmia)      unknown 10. OTHER SYMPTOMS: Do you have any other symptoms? (e.g., dizziness, chest pain, sweating, difficulty breathing)       no 11. PREGNANCY: Is there any chance you are pregnant? When was your last menstrual period?       na  Protocols used: Heart Rate and Heartbeat Questions-A-AH

## 2024-07-11 NOTE — Progress Notes (Signed)
 BP 133/85   Pulse 86   Temp 98.9 F (37.2 C) (Oral)   Ht 5' 3 (1.6 m)   Wt 201 lb 6.4 oz (91.4 kg)   LMP  (LMP Unknown)   SpO2 98%   BMI 35.68 kg/m    Subjective:    Patient ID: Briana Lyons, female    DOB: 1981-05-18, 43 y.o.   MRN: [id number]  HPI: Briana Lyons is a 43 y.o. female  Chief Complaint  Patient presents with   Tachycardia    Patient states she has been having palpitations and heart racing episodes that started today. States he heart races for about 30 seconds.    Urinary Tract Infection    Patient states she has been having burning with urination for the last week    Mouth Lesions    Patient states she has been having issues with cold sores. States she has had a prescription for Valtrex  in the past and has taken all of what she had.    PALPITATIONS Patient states today she was having a conversation and her heart started beating really fast and took about 30 seconds to 1 minute to slow down.  Then happened again about an hour later.  She has been feeling more fatigued today. Duration: today Symptom description: felt like her heart was beating out of her chest Duration of episode: seconds Frequency: no history of the same Activity when event occurred: rest Related to exertion: no Dyspnea: no Chest pain: yes Syncope: no Anxiety/stress: normal amount for her Nausea/vomiting: yes Diaphoresis: no Coronary artery disease: no Congestive heart failure: no Arrhythmia:no Thyroid disease: no Caffeine intake: has drank about 1 2L of soda in the last 24 hours Status:  stable Treatments attempted:none  URINARY SYMPTOMS Symptoms started this week. Dysuria: yes Urinary frequency: yes Urgency: yes Small volume voids: yes Symptom severity: no Urinary incontinence: no Foul odor: no Hematuria: yes Abdominal pain: yes Back pain: yes Suprapubic pain/pressure: yes Flank pain: no Fever:  no Vomiting: no Relief with cranberry juice: no Relief with pyridium :  no Status: stable Previous urinary tract infection: no Recurrent urinary tract infection: no Sexual activity: No sexually active/monogomous/practicing safe sex History of sexually transmitted disease: no Penile discharge: no Treatments attempted: increasing fluids  Patient states she gets cold sores on her mouth. Her lips starts to swell and has redness.  She used Valacyclovir  and that cleared it up. She gets them about every other month.     Relevant past medical, surgical, family and social history reviewed and updated as indicated. Interim medical history since our last visit reviewed. Allergies and medications reviewed and updated.  Review of Systems  Constitutional:  Positive for fatigue. Negative for fever.  Respiratory:  Negative for chest tightness and shortness of breath.   Cardiovascular:  Positive for chest pain and palpitations.  Gastrointestinal:  Positive for abdominal pain. Negative for vomiting.  Genitourinary:  Positive for dysuria, frequency and hematuria. Negative for decreased urine volume, flank pain and urgency.  Musculoskeletal:  Positive for back pain.  Skin:        Cold sores    Per HPI unless specifically indicated above     Objective:    BP 133/85   Pulse 86   Temp 98.9 F (37.2 C) (Oral)   Ht 5' 3 (1.6 m)   Wt 201 lb 6.4 oz (91.4 kg)   LMP  (LMP Unknown)   SpO2 98%   BMI 35.68 kg/m   Wt Readings from Last 3  Encounters:  07/11/24 201 lb 6.4 oz (91.4 kg)  06/13/24 196 lb 11.2 oz (89.2 kg)  05/24/24 193 lb 6.4 oz (87.7 kg)    Physical Exam Vitals and nursing note reviewed.  Constitutional:      General: She is not in acute distress.    Appearance: Normal appearance. She is normal weight. She is not ill-appearing, toxic-appearing or diaphoretic.  HENT:     Head: Normocephalic.     Right Ear: External ear normal.     Left Ear: External ear normal.     Nose: Nose normal.     Mouth/Throat:     Mouth: Mucous membranes are moist.      Pharynx: Oropharynx is clear.  Eyes:     General:        Right eye: No discharge.        Left eye: No discharge.     Extraocular Movements: Extraocular movements intact.     Conjunctiva/sclera: Conjunctivae normal.     Pupils: Pupils are equal, round, and reactive to light.  Cardiovascular:     Rate and Rhythm: Normal rate and regular rhythm.     Heart sounds: No murmur heard. Pulmonary:     Effort: Pulmonary effort is normal. No respiratory distress.     Breath sounds: Normal breath sounds. No wheezing or rales.  Abdominal:     General: Abdomen is flat. Bowel sounds are normal. There is no distension.     Palpations: Abdomen is soft. There is no mass.     Tenderness: There is no abdominal tenderness. There is no right CVA tenderness, left CVA tenderness, guarding or rebound.     Hernia: No hernia is present.  Musculoskeletal:     Cervical back: Normal range of motion and neck supple.  Skin:    General: Skin is warm and dry.     Capillary Refill: Capillary refill takes less than 2 seconds.  Neurological:     General: No focal deficit present.     Mental Status: She is alert and oriented to person, place, and time. Mental status is at baseline.  Psychiatric:        Mood and Affect: Mood normal.        Behavior: Behavior normal.        Thought Content: Thought content normal.        Judgment: Judgment normal.     Results for orders placed or performed in visit on 05/24/24  Microscopic Examination   Collection Time: 05/24/24 11:36 AM   Urine  Result Value Ref Range   WBC, UA 0-5 0 - 5 /hpf   RBC, Urine 0-2 0 - 2 /hpf   Epithelial Cells (non renal) 0-10 0 - 10 /hpf   Bacteria, UA None seen None seen/Few   Yeast, UA Present (A) None seen  Urinalysis, Routine w reflex microscopic   Collection Time: 05/24/24 11:36 AM  Result Value Ref Range   Specific Gravity, UA 1.020 1.005 - 1.030   pH, UA 5.5 5.0 - 7.5   Color, UA Yellow Yellow   Appearance Ur Clear Clear    Leukocytes,UA Trace (A) Negative   Protein,UA Negative Negative/Trace   Glucose, UA Negative Negative   Ketones, UA Negative Negative   RBC, UA 2+ (A) Negative   Bilirubin, UA Negative Negative   Urobilinogen, Ur 0.2 0.2 - 1.0 mg/dL   Nitrite, UA Negative Negative   Microscopic Examination See below:   Urine Culture   Collection Time: 05/24/24 12:13 PM  Specimen: Urine   UR  Result Value Ref Range   Urine Culture, Routine Final report    Organism ID, Bacteria Comment       Assessment & Plan:   Problem List Items Addressed This Visit   None Visit Diagnoses       Palpitations    -  Primary   EKG and ZIO monitor reviewed with patient during visit. NSR on EKG. No ectopy or arrythmia seen on ZIO.  Decrease Caffeine intake. FU if not improved.   Relevant Orders   EKG 12-Lead     Yeast infection       Will treat with Diflucan .  Follow up if not improved.   Relevant Medications   valACYclovir  (VALTREX ) 500 MG tablet   metroNIDAZOLE  (FLAGYL ) 500 MG tablet   fluconazole  (DIFLUCAN ) 150 MG tablet     BV (bacterial vaginosis)       Wil treat with flagyl .  Complete course of medication.  Advised not to drink while taking medicaiton.   Relevant Medications   valACYclovir  (VALTREX ) 500 MG tablet   metroNIDAZOLE  (FLAGYL ) 500 MG tablet   fluconazole  (DIFLUCAN ) 150 MG tablet     Burning with urination       Relevant Orders   Urinalysis, Routine w reflex microscopic   WET PREP FOR TRICH, YEAST, CLUE     Abnormal urinalysis       Relevant Orders   Urine Culture     Recurrent cold sores       Due to frequency of outbreaks. Will treat with Valacyclovir  daily. Discussed side effects and benefits of medication.   Relevant Medications   valACYclovir  (VALTREX ) 500 MG tablet   metroNIDAZOLE  (FLAGYL ) 500 MG tablet   fluconazole  (DIFLUCAN ) 150 MG tablet        Follow up plan: Return for FU.

## 2024-07-11 NOTE — Telephone Encounter (Signed)
 Addressed during visit

## 2024-07-12 ENCOUNTER — Ambulatory Visit: Payer: Self-pay | Admitting: Nurse Practitioner

## 2024-07-12 NOTE — Telephone Encounter (Signed)
 Addressed at visit yesterday

## 2024-07-12 NOTE — Telephone Encounter (Signed)
Addressed at office visit yesterday.

## 2024-07-13 LAB — URINE CULTURE: Organism ID, Bacteria: NO GROWTH

## 2024-07-24 ENCOUNTER — Ambulatory Visit (INDEPENDENT_AMBULATORY_CARE_PROVIDER_SITE_OTHER): Payer: MEDICAID | Admitting: Nurse Practitioner

## 2024-07-24 ENCOUNTER — Ambulatory Visit: Payer: Self-pay

## 2024-07-24 ENCOUNTER — Encounter: Payer: Self-pay | Admitting: Nurse Practitioner

## 2024-07-24 ENCOUNTER — Ambulatory Visit: Payer: Self-pay | Admitting: Nurse Practitioner

## 2024-07-24 VITALS — BP 134/89 | HR 78 | Temp 98.4°F | Resp 15 | Ht 62.99 in | Wt 200.0 lb

## 2024-07-24 DIAGNOSIS — K219 Gastro-esophageal reflux disease without esophagitis: Secondary | ICD-10-CM

## 2024-07-24 DIAGNOSIS — R220 Localized swelling, mass and lump, head: Secondary | ICD-10-CM | POA: Insufficient documentation

## 2024-07-24 DIAGNOSIS — G8929 Other chronic pain: Secondary | ICD-10-CM | POA: Insufficient documentation

## 2024-07-24 DIAGNOSIS — N760 Acute vaginitis: Secondary | ICD-10-CM

## 2024-07-24 DIAGNOSIS — B9689 Other specified bacterial agents as the cause of diseases classified elsewhere: Secondary | ICD-10-CM | POA: Insufficient documentation

## 2024-07-24 DIAGNOSIS — M545 Low back pain, unspecified: Secondary | ICD-10-CM | POA: Diagnosis not present

## 2024-07-24 LAB — WET PREP FOR TRICH, YEAST, CLUE
Clue Cell Exam: POSITIVE — AB
Trichomonas Exam: NEGATIVE
Yeast Exam: NEGATIVE

## 2024-07-24 MED ORDER — FLUTICASONE PROPIONATE 50 MCG/ACT NA SUSP: 2.0000 | Freq: Every day | NASAL | 6 refills | Status: AC

## 2024-07-24 MED ORDER — METHYLPREDNISOLONE 4 MG PO TBPK: ORAL_TABLET | ORAL | 0 refills | Status: DC

## 2024-07-24 MED ORDER — OMEPRAZOLE 40 MG PO CPDR: 40.0000 mg | DELAYED_RELEASE_CAPSULE | Freq: Two times a day (BID) | ORAL | 0 refills | Status: AC

## 2024-07-24 MED ORDER — PROMETHAZINE HCL 12.5 MG PO TABS: 12.5000 mg | ORAL_TABLET | Freq: Three times a day (TID) | ORAL | 0 refills | Status: DC | PRN

## 2024-07-24 MED ORDER — LORATADINE 10 MG PO TABS: 10.0000 mg | ORAL_TABLET | Freq: Every day | ORAL | 11 refills | Status: AC

## 2024-07-24 MED ORDER — METRONIDAZOLE 0.75 % VA GEL: 1.0000 | Freq: Every day | VAGINAL | 0 refills | Status: AC

## 2024-07-24 NOTE — Telephone Encounter (Signed)
 FYI Only or Action Required?: FYI only for provider.  Patient was last seen in primary care on 07/11/2024 by Melvin Pao, NP.  Called Nurse Triage reporting Abdominal Pain.  Symptoms began several days ago.  Interventions attempted: Nothing.  Symptoms are: stable.  Triage Disposition: No disposition on file.  Patient/caregiver understands and will follow disposition?:  Reason for Disposition  Abdominal pain is a chronic symptom (recurrent or ongoing AND present > 4 weeks)  Additional Information  Commented on: Answer Assessment    Patient calling back in. Previous RN unable to finish triage. Patient diagnosed with yeast infection last week, given Flagyl  and started feeling nauseous, experiencing abdominal pain, face became swollen, sinuses pressure, ears hurt. Unsure if its allergies, a cold or the medicine, stated something similar happened last time took Flagyl . Scheduled OV today.  Protocols used: Abdominal Pain - Female-A-AH

## 2024-07-24 NOTE — Assessment & Plan Note (Signed)
 Requesting a repeat swab today, will obtain and treat as needed.

## 2024-07-24 NOTE — Progress Notes (Signed)
 BP 134/89 (BP Location: Left Arm, Patient Position: Sitting, Cuff Size: Large)   Pulse 78   Temp 98.4 F (36.9 C) (Oral)   Resp 15   Ht 5' 2.99 (1.6 m)   Wt 200 lb (90.7 kg)   LMP  (LMP Unknown)   SpO2 98%   BMI 35.44 kg/m    Subjective:    Patient ID: Briana Lyons, female    DOB: [birth year], 43 y.o.   MRN: [id number]  HPI: Briana Lyons is a 43 y.o. female  Chief Complaint  Patient presents with   Oral Swelling    Started last night after taking Flagyl . Lip swelling and redness. Applied cold compress and ice and used benadryl  but still swollen this morning.    Fall    Sore all over. Mentions falling out of a truck but didn't think she was hurt.    Reports a recent fall two days ago, this made her sore all over. Fell on her right side.  Went to get up in friends truck, did not grab handle.  Hand slipped off window and fell over.  Has been taking Tylenol  and Ibuprofen  + Flexeril .  Saw ortho for chronic back pain on 06/19/24, GYN sent her to them. Has cysts on ovaries that she feels cause pain, reported there was nothing they could do. Would like referral to chronic pain management.  ORAL SWELLING She reports every time she takes Diflucan  her face swells up.  Took this recently and her face puffed up. Would like Flonase  refills. She would like recheck of her vaginal swab today.   Runny nose: yes clear Nasal congestion: yes Nasal itching: no Sneezing: no Eye swelling, itching or discharge: no Post nasal drip: yes Cough: no Sinus pressure: yes  Ear pain: none Ear pressure: yes bilateral Fever: none  Relevant past medical, surgical, family and social history reviewed and updated as indicated. Interim medical history since our last visit reviewed. Allergies and medications reviewed and updated.  Review of Systems  Constitutional:  Negative for activity change, appetite change, diaphoresis, fatigue and fever.  HENT:  Positive for congestion, facial swelling, postnasal drip,  rhinorrhea and sinus pressure. Negative for sore throat.   Respiratory:  Negative for cough, chest tightness, shortness of breath and wheezing.   Cardiovascular:  Negative for chest pain, palpitations and leg swelling.  Gastrointestinal: Negative.   Musculoskeletal:  Positive for back pain.  Neurological: Negative.   Psychiatric/Behavioral: Negative.      Per HPI unless specifically indicated above     Objective:    BP 134/89 (BP Location: Left Arm, Patient Position: Sitting, Cuff Size: Large)   Pulse 78   Temp 98.4 F (36.9 C) (Oral)   Resp 15   Ht 5' 2.99 (1.6 m)   Wt 200 lb (90.7 kg)   LMP  (LMP Unknown)   SpO2 98%   BMI 35.44 kg/m   Wt Readings from Last 3 Encounters:  07/24/24 200 lb (90.7 kg)  07/11/24 201 lb 6.4 oz (91.4 kg)  06/13/24 196 lb 11.2 oz (89.2 kg)    Physical Exam Vitals and nursing note reviewed.  Constitutional:      General: She is awake. She is not in acute distress.    Appearance: She is well-developed and well-groomed. She is obese. She is not ill-appearing or toxic-appearing.  HENT:     Head: Normocephalic.     Right Ear: Hearing, ear canal and external ear normal. A middle ear effusion is present. There is no  impacted cerumen. Tympanic membrane is not injected.     Left Ear: Hearing, ear canal and external ear normal. A middle ear effusion is present. There is no impacted cerumen. Tympanic membrane is not injected.     Ears:     Comments: Mild swelling to lower lip.    Nose: Nose normal.     Right Sinus: No maxillary sinus tenderness or frontal sinus tenderness.     Left Sinus: No maxillary sinus tenderness or frontal sinus tenderness.     Mouth/Throat:     Mouth: Mucous membranes are moist.     Pharynx: Oropharynx is clear. No pharyngeal swelling, oropharyngeal exudate or posterior oropharyngeal erythema.  Eyes:     General: Lids are normal.        Right eye: No discharge.        Left eye: No discharge.     Conjunctiva/sclera:  Conjunctivae normal.     Pupils: Pupils are equal, round, and reactive to light.  Neck:     Thyroid: No thyromegaly.     Vascular: No carotid bruit.  Cardiovascular:     Rate and Rhythm: Normal rate and regular rhythm.     Heart sounds: Normal heart sounds. No murmur heard.    No gallop.  Pulmonary:     Effort: Pulmonary effort is normal. No accessory muscle usage or respiratory distress.     Breath sounds: Normal breath sounds.  Abdominal:     General: Bowel sounds are normal. There is no distension.     Palpations: Abdomen is soft.     Tenderness: There is no abdominal tenderness.  Musculoskeletal:     Cervical back: Normal range of motion and neck supple.     Lumbar back: Normal.     Right lower leg: No edema.     Left lower leg: No edema.  Lymphadenopathy:     Cervical: No cervical adenopathy.  Skin:    General: Skin is warm and dry.  Neurological:     Mental Status: She is alert and oriented to person, place, and time.     Deep Tendon Reflexes: Reflexes are normal and symmetric.     Reflex Scores:      Brachioradialis reflexes are 2+ on the right side and 2+ on the left side.      Patellar reflexes are 2+ on the right side and 2+ on the left side. Psychiatric:        Attention and Perception: Attention normal.        Mood and Affect: Mood normal.        Speech: Speech normal.        Behavior: Behavior normal. Behavior is cooperative.        Thought Content: Thought content normal.     Results for orders placed or performed in visit on 07/11/24  WET PREP FOR TRICH, YEAST, CLUE   Collection Time: 07/11/24  1:46 PM   Specimen: Urine   Urine  Result Value Ref Range   Trichomonas Exam Negative Negative   Yeast Exam Positive (A) Negative   Clue Cell Exam Positive (A) Negative  Microscopic Examination   Collection Time: 07/11/24  1:46 PM   Urine  Result Value Ref Range   WBC, UA 0-5 0 - 5 /hpf   RBC, Urine 0-2 0 - 2 /hpf   Epithelial Cells (non renal) 0-10 0 - 10  /hpf   Bacteria, UA None seen None seen/Few   Yeast, UA Present (A) None seen  Urinalysis, Routine w reflex microscopic   Collection Time: 07/11/24  1:46 PM  Result Value Ref Range   Specific Gravity, UA <1.005 (L) 1.005 - 1.030   pH, UA 6.0 5.0 - 7.5   Color, UA Yellow Yellow   Appearance Ur Clear Clear   Leukocytes,UA Negative Negative   Protein,UA Negative Negative/Trace   Glucose, UA Negative Negative   Ketones, UA Negative Negative   RBC, UA 1+ (A) Negative   Bilirubin, UA Negative Negative   Urobilinogen, Ur 0.2 0.2 - 1.0 mg/dL   Nitrite, UA Negative Negative   Microscopic Examination See below:   Urine Culture   Collection Time: 07/11/24  3:53 PM   Specimen: Urine   UR  Result Value Ref Range   Urine Culture, Routine Final report    Organism ID, Bacteria No growth       Assessment & Plan:   Problem List Items Addressed This Visit       Digestive   GERD (gastroesophageal reflux disease)   Relevant Medications   omeprazole  (PRILOSEC) 40 MG capsule     Genitourinary   Bacterial vaginosis   Requesting a repeat swab today, will obtain and treat as needed.      Relevant Orders   WET PREP FOR TRICH, YEAST, CLUE     Other   Facial swelling - Primary   Acute after taking Diflucan , which she reports has happened before.  Took Benadryl , but did not completely get rid of this.  Start steroid taper which may benefit both back pain and her swelling.  Overall reassuring exam.  Would avoid use of Diflucan  in future and added to her allergy list, may need to trial vaginal creams if needed.  ?if dye in Diflucan  causes reaction vs medication itself.  If ongoing issues would benefit allergy testing.      Chronic back pain   Chronic in nature, has seen both ortho and GYN -- she reports pain is from her ovarian cysts and no one can do anything.  Requesting referral to pain management, will place this per her request.        Relevant Medications   methylPREDNISolone (MEDROL  DOSEPAK) 4 MG TBPK tablet   Other Relevant Orders   Ambulatory referral to Pain Clinic     Follow up plan: Return if symptoms worsen or fail to improve.

## 2024-07-24 NOTE — Addendum Note (Signed)
 Addended by: Paitlyn Mcclatchey T on: 07/24/2024 02:15 PM   Modules accepted: Orders

## 2024-07-24 NOTE — Telephone Encounter (Signed)
 Noted

## 2024-07-24 NOTE — Assessment & Plan Note (Addendum)
 Chronic in nature, has seen both ortho and GYN -- she reports pain is from her ovarian cysts and no one can do anything.  Requesting referral to pain management, will place this per her request.

## 2024-07-24 NOTE — Patient Instructions (Signed)

## 2024-07-24 NOTE — Progress Notes (Signed)
 Contacted via MyChart  You are still showing some clue cells which points towards more ongoing bacterial vaginosis. I am sending in antibiotic cream you place vaginally since oral did not appear to offer much benefit.  Any questions?

## 2024-07-24 NOTE — Assessment & Plan Note (Signed)
 Acute after taking Diflucan , which she reports has happened before.  Took Benadryl , but did not completely get rid of this.  Start steroid taper which may benefit both back pain and her swelling.  Overall reassuring exam.  Would avoid use of Diflucan  in future and added to her allergy list, may need to trial vaginal creams if needed.  ?if dye in Diflucan  causes reaction vs medication itself.  If ongoing issues would benefit allergy testing.

## 2024-07-24 NOTE — Telephone Encounter (Signed)
  Copied from CRM [id number]. Topic: Clinical - Red Word Triage >> Jul 24, 2024 10:38 AM Turkey B wrote: [facility] that prompted transfer to Nurse Triage Patient has severe stomach pain, face swollen from reaction to med, runny nose, can't concentrate in class Answer Assessment - Initial Assessment Questions RN had begun triage, but pt stated she had to go and would call back as soon as she got home. Pt mentioned wanting to be seen in office today, but RN was unable to proceed further with triage/scheduling.   1. LOCATION: Where does it hurt?      Lower abd pain 2. RADIATION: Does the pain shoot anywhere else? (e.g., chest, back)     *No Answer* 3. ONSET: When did the pain begin? (e.g., minutes, hours or days ago)      *No Answer* 4. SUDDEN: Gradual or sudden onset?     *No Answer* 5. PATTERN Does the pain come and go, or is it constant?     *No Answer* 6. SEVERITY: How bad is the pain?  (e.g., Scale 1-10; mild, moderate, or severe)     *No Answer* 7. RECURRENT SYMPTOM: Have you ever had this type of stomach pain before? If Yes, ask: When was the last time? and What happened that time?      *No Answer* 8. CAUSE: What do you think is causing the stomach pain? (e.g., gallstones, recent abdominal surgery)     *No Answer* 9. RELIEVING/AGGRAVATING FACTORS: What makes it better or worse? (e.g., antacids, bending or twisting motion, bowel movement)     *No Answer* 10. OTHER SYMPTOMS: Do you have any other symptoms? (e.g., back pain, diarrhea, fever, urination pain, vomiting)       *No Answer* 11. PREGNANCY: Is there any chance you are pregnant? When was your last menstrual period?       *No Answer*  Protocols used: Abdominal Pain - Female-A-AH

## 2024-08-25 ENCOUNTER — Emergency Department: Payer: MEDICAID

## 2024-08-25 ENCOUNTER — Other Ambulatory Visit: Payer: Self-pay

## 2024-08-25 ENCOUNTER — Emergency Department
Admission: EM | Admit: 2024-08-25 | Discharge: 2024-08-25 | Disposition: A | Discharge status: Home / Self Care | Payer: MEDICAID | Attending: Emergency Medicine | Admitting: Emergency Medicine

## 2024-08-25 DIAGNOSIS — J069 Acute upper respiratory infection, unspecified: Secondary | ICD-10-CM | POA: Diagnosis not present

## 2024-08-25 DIAGNOSIS — N760 Acute vaginitis: Secondary | ICD-10-CM | POA: Diagnosis not present

## 2024-08-25 DIAGNOSIS — R1032 Left lower quadrant pain: Secondary | ICD-10-CM | POA: Diagnosis present

## 2024-08-25 LAB — RESP PANEL BY RT-PCR (RSV, FLU A&B, COVID)  RVPGX2
Influenza A by PCR: NEGATIVE
Influenza B by PCR: NEGATIVE
Resp Syncytial Virus by PCR: NEGATIVE
SARS Coronavirus 2 by RT PCR: NEGATIVE

## 2024-08-25 LAB — URINALYSIS, ROUTINE W REFLEX MICROSCOPIC
Bilirubin Urine: NEGATIVE
Glucose, UA: NEGATIVE mg/dL
Ketones, ur: NEGATIVE mg/dL
Nitrite: NEGATIVE
Protein, ur: NEGATIVE mg/dL
Specific Gravity, Urine: 1.005 (ref 1.005–1.030)
pH: 6 (ref 5.0–8.0)

## 2024-08-25 LAB — CBC
HCT: 38 % (ref 36.0–46.0)
Hemoglobin: 12.9 g/dL (ref 12.0–15.0)
MCH: 29.7 pg (ref 26.0–34.0)
MCHC: 33.9 g/dL (ref 30.0–36.0)
MCV: 87.4 fL (ref 80.0–100.0)
Platelets: 416 K/uL — ABNORMAL HIGH (ref 150–400)
RBC: 4.35 MIL/uL (ref 3.87–5.11)
RDW: 12.8 % (ref 11.5–15.5)
WBC: 7.8 K/uL (ref 4.0–10.5)
nRBC: 0 % (ref 0.0–0.2)

## 2024-08-25 LAB — COMPREHENSIVE METABOLIC PANEL WITH GFR
ALT: 25 U/L (ref 0–44)
AST: 32 U/L (ref 15–41)
Albumin: 4.1 g/dL (ref 3.5–5.0)
Alkaline Phosphatase: 80 U/L (ref 38–126)
Anion gap: 10 (ref 5–15)
BUN: 11 mg/dL (ref 6–20)
CO2: 25 mmol/L (ref 22–32)
Calcium: 9.4 mg/dL (ref 8.9–10.3)
Chloride: 105 mmol/L (ref 98–111)
Creatinine, Ser: 0.91 mg/dL (ref 0.44–1.00)
GFR, Estimated: >60 mL/min (ref >60)
Glucose, Bld: 116 mg/dL — ABNORMAL HIGH (ref 70–99)
Potassium: 3.7 mmol/L (ref 3.5–5.1)
Sodium: 140 mmol/L (ref 135–145)
Total Bilirubin: 0.7 mg/dL (ref 0.0–1.2)
Total Protein: 7.7 g/dL (ref 6.5–8.1)

## 2024-08-25 LAB — WET PREP, GENITAL
Clue Cells Wet Prep HPF POC: NONE SEEN
Sperm: NONE SEEN
Trich, Wet Prep: NONE SEEN
WBC, Wet Prep HPF POC: >=10 — AB (ref <10)
Yeast Wet Prep HPF POC: NONE SEEN

## 2024-08-25 LAB — CHLAMYDIA/NGC RT PCR (ARMC ONLY)
Chlamydia Tr: NOT DETECTED
N gonorrhoeae: NOT DETECTED

## 2024-08-25 LAB — LIPASE, BLOOD: Lipase: 47 U/L (ref 11–51)

## 2024-08-25 LAB — POC URINE PREG, ED: Preg Test, Ur: NEGATIVE

## 2024-08-25 LAB — GROUP A STREP BY PCR: Group A Strep by PCR: NOT DETECTED

## 2024-08-25 LAB — MONONUCLEOSIS SCREEN: Mono Screen: NEGATIVE

## 2024-08-25 MED ORDER — IBUPROFEN 600 MG PO TABS
600.0000 mg | ORAL_TABLET | Freq: Once | ORAL | Status: AC
  Administered 2024-08-25: 600 mg via ORAL
  Filled 2024-08-25: qty 1

## 2024-08-25 MED ORDER — DOXYCYCLINE MONOHYDRATE 100 MG PO TABS: 100.0000 mg | ORAL_TABLET | Freq: Two times a day (BID) | ORAL | 0 refills | Status: DC

## 2024-08-25 MED ORDER — IOHEXOL 300 MG/ML  SOLN
100.0000 mL | Freq: Once | INTRAMUSCULAR | Status: AC | PRN
  Administered 2024-08-25: 100 mL via INTRAVENOUS
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
## 2024-08-25 NOTE — Discharge Instructions (Signed)
 Please take the antibiotic as prescribed.  Please return for any new, worsening, or changing symptoms or other concerns.  It was a pleasure caring for you today.

## 2024-08-25 NOTE — ED Triage Notes (Signed)
 Pt to ED for sore throat and bilateral ear pain since 2-3 days, and LLQ cramping abdominal pain since 3-4 days ago. Denies dysuria and NVD. Submandibular lymph nodes are swollen bilaterally. Denies cough. Respirations unlabored.

## 2024-08-25 NOTE — ED Notes (Signed)
 See triage note  Presents with sore throat and some swollen glands with ear pain  States that started 2-3 days ago Also having some intermittent abd cramping and dysuria   Afebrile on arrival

## 2024-08-27 ENCOUNTER — Ambulatory Visit (INDEPENDENT_AMBULATORY_CARE_PROVIDER_SITE_OTHER): Payer: MEDICAID | Admitting: Nurse Practitioner

## 2024-08-27 ENCOUNTER — Encounter: Payer: Self-pay | Admitting: Nurse Practitioner

## 2024-08-27 VITALS — BP 129/89 | HR 90 | Temp 99.2°F | Resp 15 | Ht 62.99 in | Wt 211.0 lb

## 2024-08-27 DIAGNOSIS — F419 Anxiety disorder, unspecified: Secondary | ICD-10-CM

## 2024-08-27 DIAGNOSIS — R6884 Jaw pain: Secondary | ICD-10-CM | POA: Diagnosis not present

## 2024-08-27 MED ORDER — BUSPIRONE HCL 10 MG PO TABS: 10.0000 mg | ORAL_TABLET | Freq: Two times a day (BID) | ORAL | 0 refills | Status: AC

## 2024-08-27 MED ORDER — CYCLOBENZAPRINE HCL 10 MG PO TABS: 10.0000 mg | ORAL_TABLET | Freq: Three times a day (TID) | ORAL | 1 refills | Status: DC | PRN

## 2024-08-27 NOTE — Assessment & Plan Note (Signed)
 Chronic. Not well controlled.  Hydroxyzine  works well but makes patient very sleepy.  Will add Buspar 10mg  for patient to take during the day.  Side effects and benefits discussed.  Does not feel like she needs something for depression at this time.  Follow up in 3 months.  Call sooner if concerns arise.

## 2024-08-27 NOTE — Progress Notes (Signed)
 BP 129/89 (BP Location: Left Arm, Patient Position: Sitting, Cuff Size: Large)   Pulse 90   Temp 99.2 F (37.3 C) (Oral)   Resp 15   Ht 5' 2.99 (1.6 m)   Wt 211 lb (95.7 kg)   LMP 08/04/2024   SpO2 98%   BMI 37.39 kg/m    Subjective:    Patient ID: Briana Lyons, female    DOB: [birth year], 43 y.o.   MRN: [id number]  HPI: Briana Lyons is a 43 y.o. female  Chief Complaint  Patient presents with   Depression/Anxiety FU    Very depressed and always in pain.    Jaw Pain    Started as ear pain and now is the jaw on both sides, swelling and uses heating pad to help her sleep at night.    Patient states she has been feeling very depressed.  She has a lot of family matters that have been causing her more stress.  She feels like her anxiety and nerves are bad.  Is scared to go out of her room at times.   Patient states she has been having a lot of jaw pain on the bottom.  It stated off in the ears and now is down into her jaw and throat.  States the pain is agonizing.  She denies recent illness.  The pain started on Friday.  She has ben sleeping a lot.  She has been using a heating pad.  Feels like the cyclobenzaprine  helped last night.    Relevant past medical, surgical, family and social history reviewed and updated as indicated. Interim medical history since our last visit reviewed. Allergies and medications reviewed and updated.  Review of Systems  HENT:  Positive for ear pain.        Jaw pain  Musculoskeletal:  Positive for neck pain.  Psychiatric/Behavioral:  Positive for dysphoric mood. Negative for suicidal ideas. The patient is nervous/anxious.     Per HPI unless specifically indicated above     Objective:    BP 129/89 (BP Location: Left Arm, Patient Position: Sitting, Cuff Size: Large)   Pulse 90   Temp 99.2 F (37.3 C) (Oral)   Resp 15   Ht 5' 2.99 (1.6 m)   Wt 211 lb (95.7 kg)   LMP 08/04/2024   SpO2 98%   BMI 37.39 kg/m   Wt Readings from Last 3  Encounters:  08/27/24 211 lb (95.7 kg)  08/25/24 206 lb (93.4 kg)  07/24/24 200 lb (90.7 kg)    Physical Exam Vitals and nursing note reviewed.  Constitutional:      General: She is not in acute distress.    Appearance: Normal appearance. She is normal weight. She is not ill-appearing, toxic-appearing or diaphoretic.  HENT:     Head: Normocephalic.     Right Ear: External ear normal. A middle ear effusion is present.     Left Ear: External ear normal. A middle ear effusion is present.     Nose: Nose normal.     Mouth/Throat:     Mouth: Mucous membranes are moist.     Pharynx: Oropharynx is clear.  Eyes:     General:        Right eye: No discharge.        Left eye: No discharge.     Extraocular Movements: Extraocular movements intact.     Conjunctiva/sclera: Conjunctivae normal.     Pupils: Pupils are equal, round, and reactive to light.  Cardiovascular:  Rate and Rhythm: Normal rate and regular rhythm.     Heart sounds: No murmur heard. Pulmonary:     Effort: Pulmonary effort is normal. No respiratory distress.     Breath sounds: Normal breath sounds. No wheezing or rales.  Abdominal:     General: Abdomen is flat. Bowel sounds are normal. There is no distension.     Palpations: Abdomen is soft. There is no mass.     Tenderness: There is no abdominal tenderness. There is no right CVA tenderness, left CVA tenderness, guarding or rebound.     Hernia: No hernia is present.  Musculoskeletal:     Cervical back: Normal range of motion and neck supple.  Skin:    General: Skin is warm and dry.     Capillary Refill: Capillary refill takes less than 2 seconds.  Neurological:     General: No focal deficit present.     Mental Status: She is alert and oriented to person, place, and time. Mental status is at baseline.  Psychiatric:        Mood and Affect: Mood normal.        Behavior: Behavior normal.        Thought Content: Thought content normal.        Judgment: Judgment  normal.     Results for orders placed or performed during the hospital encounter of 08/25/24  Lipase, blood   Collection Time: 08/25/24  9:29 AM  Result Value Ref Range   Lipase 47 11 - 51 U/L  Comprehensive metabolic panel   Collection Time: 08/25/24  9:29 AM  Result Value Ref Range   Sodium 140 135 - 145 mmol/L   Potassium 3.7 3.5 - 5.1 mmol/L   Chloride 105 98 - 111 mmol/L   CO2 25 22 - 32 mmol/L   Glucose, Bld 116 (H) 70 - 99 mg/dL   BUN 11 6 - 20 mg/dL   Creatinine, Ser 9.08 0.44 - 1.00 mg/dL   Calcium  9.4 8.9 - 10.3 mg/dL   Total Protein 7.7 6.5 - 8.1 g/dL   Albumin 4.1 3.5 - 5.0 g/dL   AST 32 15 - 41 U/L   ALT 25 0 - 44 U/L   Alkaline Phosphatase 80 38 - 126 U/L   Total Bilirubin 0.7 0.0 - 1.2 mg/dL   GFR, Estimated >39 >39 mL/min   Anion gap 10 5 - 15  CBC   Collection Time: 08/25/24  9:29 AM  Result Value Ref Range   WBC 7.8 4.0 - 10.5 K/uL   RBC 4.35 3.87 - 5.11 MIL/uL   Hemoglobin 12.9 12.0 - 15.0 g/dL   HCT 61.9 63.9 - 53.9 %   MCV 87.4 80.0 - 100.0 fL   MCH 29.7 26.0 - 34.0 pg   MCHC 33.9 30.0 - 36.0 g/dL   RDW 87.1 88.4 - 84.4 %   Platelets 416 (H) 150 - 400 K/uL   nRBC 0.0 0.0 - 0.2 %  Resp panel by RT-PCR (RSV, Flu A&B, Covid) Anterior Nasal Swab   Collection Time: 08/25/24  9:30 AM   Specimen: Anterior Nasal Swab  Result Value Ref Range   SARS Coronavirus 2 by RT PCR NEGATIVE NEGATIVE   Influenza A by PCR NEGATIVE NEGATIVE   Influenza B by PCR NEGATIVE NEGATIVE   Resp Syncytial Virus by PCR NEGATIVE NEGATIVE  Group A Strep by PCR ([facility] Only)   Collection Time: 08/25/24  9:30 AM   Specimen: Throat; Sterile Swab  Result Value Ref  Range   Group A Strep by PCR NOT DETECTED NOT DETECTED  POC urine preg, ED   Collection Time: 08/25/24 10:52 AM  Result Value Ref Range   Preg Test, Ur NEGATIVE NEGATIVE  Urinalysis, Routine w reflex microscopic -Urine, Clean Catch   Collection Time: 08/25/24 11:00 AM  Result Value Ref Range   Color, Urine STRAW (A)  YELLOW   APPearance CLEAR (A) CLEAR   Specific Gravity, Urine 1.005 1.005 - 1.030   pH 6.0 5.0 - 8.0   Glucose, UA NEGATIVE NEGATIVE mg/dL   Hgb urine dipstick SMALL (A) NEGATIVE   Bilirubin Urine NEGATIVE NEGATIVE   Ketones, ur NEGATIVE NEGATIVE mg/dL   Protein, ur NEGATIVE NEGATIVE mg/dL   Nitrite NEGATIVE NEGATIVE   Leukocytes,Ua SMALL (A) NEGATIVE   RBC / HPF 0-5 0 - 5 RBC/hpf   WBC, UA 0-5 0 - 5 WBC/hpf   Bacteria, UA RARE (A) NONE SEEN   Squamous Epithelial / HPF 6-10 0 - 5 /HPF   Mucus PRESENT   Wet prep, genital   Collection Time: 08/25/24 11:35 AM   Specimen: Cervix  Result Value Ref Range   Yeast Wet Prep HPF POC NONE SEEN NONE SEEN   Trich, Wet Prep NONE SEEN NONE SEEN   Clue Cells Wet Prep HPF POC NONE SEEN NONE SEEN   WBC, Wet Prep HPF POC >=10 (A) <10   Sperm NONE SEEN   Chlamydia/NGC rt PCR ([facility] only)   Collection Time: 08/25/24 11:35 AM   Specimen: Cervix  Result Value Ref Range   Specimen source GC/Chlam ENDOCERVICAL    Chlamydia Tr NOT DETECTED NOT DETECTED   N gonorrhoeae NOT DETECTED NOT DETECTED  Mononucleosis screen   Collection Time: 08/25/24 12:33 PM  Result Value Ref Range   Mono Screen NEGATIVE NEGATIVE      Assessment & Plan:   Problem List Items Addressed This Visit       Other   Anxiety   Chronic. Not well controlled.  Hydroxyzine  works well but makes patient very sleepy.  Will add Buspar 10mg  for patient to take during the day.  Side effects and benefits discussed.  Does not feel like she needs something for depression at this time.  Follow up in 3 months.  Call sooner if concerns arise.       Relevant Medications   busPIRone (BUSPAR) 10 MG tablet   Other Visit Diagnoses       Jaw pain    -  Primary   Suspect musculoskeletal pain.  Will increase dose of cyclobenzaprine .  Follow up if not improved.         Follow up plan: Return in about 3 months (around 11/27/2024) for Depression/Anxiety FU.

## 2024-09-13 ENCOUNTER — Ambulatory Visit (LOCAL_COMMUNITY_HEALTH_CENTER): Payer: MEDICAID

## 2024-09-13 DIAGNOSIS — Z111 Encounter for screening for respiratory tuberculosis: Secondary | ICD-10-CM

## 2024-09-16 ENCOUNTER — Ambulatory Visit (LOCAL_COMMUNITY_HEALTH_CENTER): Payer: MEDICAID

## 2024-09-16 DIAGNOSIS — Z111 Encounter for screening for respiratory tuberculosis: Secondary | ICD-10-CM

## 2024-09-16 LAB — TB SKIN TEST
Induration: 0 mm
TB Skin Test: NEGATIVE

## 2024-10-02 ENCOUNTER — Ambulatory Visit: Payer: Self-pay

## 2024-10-02 NOTE — Telephone Encounter (Signed)
 FYI Only or Action Required?: FYI only for provider: ED advised. Refuses ED, states she is not able to go today.   Patient was last seen in primary care on 08/27/2024 by Melvin Pao, NP.  Called Nurse Triage reporting Dysuria.  Symptoms began a week ago.  Interventions attempted: OTC medications: Azo.  Symptoms are: gradually worsening.  Triage Disposition: Go to ED Now (Notify PCP)  Patient/caregiver understands and will follow disposition?: No, refuses disposition  Reason for Disposition  [1] Unable to urinate (or only a few drops) > 4 hours AND [2] bladder feels very full (e.g., palpable bladder or strong urge to urinate)  Answer Assessment - Initial Assessment Questions Patient states that she started to have burning and discomfort with urination about a week ago. She started taking Azo about 4 days ago, but symptoms have only worsened. She says she feels feverish, but has not been able to take her temperature. She has had some nausea and lightheadedness. She states that she has only been able to urinate 3 times today and 2 times she felt like she needed to go a lot, but just a sprinkle came out. She does state that she feels like she's not emptying her bladder and that she has some bloating/distention of the lower abdomen. Advised to go to ED today, but states she is not able to and will go this weekend. Advised to go to [facility] or ED as soon as possible.   1. SEVERITY: How bad is the pain?  (e.g., Scale 1-10; mild, moderate, or severe)     7/10  2. FREQUENCY: How many times have you had painful urination today?      3 times today-first time was a good amount, last two just a sprinkle  3. PATTERN: Is pain present every time you urinate or just sometimes?      Every time  4. ONSET: When did the painful urination start?      A week ago  5. FEVER: Do you have a fever? If Yes, ask: What is your temperature, how was it measured, and when did it start?     Feels  feverish   6. PAST UTI: Have you had a urine infection before? If Yes, ask: When was the last time? and What happened that time?      Yes  7. CAUSE: What do you think is causing the painful urination?  (e.g., UTI, scratch, Herpes sore)     UTI  8. OTHER SYMPTOMS: Do you have any other symptoms? (e.g., blood in urine, flank pain, genital sores, urgency, vaginal discharge)     Discharge, cold/hot sweats, nausea, lightheadedness  9. PREGNANCY: Is there any chance you are pregnant? When was your last menstrual period?     NA  Protocols used: Urination Pain - Female-A-AH  Copied from CRM #[id number]. Topic: Clinical - Red Word Triage >> Oct 02, 2024  4:25 PM Tobias L wrote: Red Word that prompted transfer to Nurse Triage: pain really bad, possible UTI, worsening pain, some nausea, burning when urinating   requesting appt before 12, if possible between 9 and 12 on friday

## 2024-10-03 NOTE — Telephone Encounter (Signed)
 Patient has been called and a message left for them to return the call to the office. Ok for E2C2 to review if/when they return the call. Please do not transfer to CAL rather send a CRM if needed only.  Returned call to patient but went directly to voicemail. If/when patient returns call please advise we do not currently have any openings in her timeframe given during triage call yesterday. If she is willing to be seen for a 1 time visit please check other local office as this is an acute need. If not please advise that she should be seen in ED/UC.   Forwarding message to covering providers as PCP is away from office today.

## 2024-10-03 NOTE — Patient Instructions (Incomplete)

## 2024-10-03 NOTE — Telephone Encounter (Signed)
 I can see her at 4PM today as well

## 2024-10-03 NOTE — Telephone Encounter (Signed)
 Appt scheduled

## 2024-10-04 ENCOUNTER — Ambulatory Visit: Payer: MEDICAID | Admitting: Nurse Practitioner

## 2024-10-05 ENCOUNTER — Other Ambulatory Visit: Payer: Self-pay

## 2024-10-05 ENCOUNTER — Emergency Department
Admission: EM | Admit: 2024-10-05 | Discharge: 2024-10-05 | Disposition: A | Discharge status: Home / Self Care | Payer: MEDICAID

## 2024-10-05 DIAGNOSIS — N3000 Acute cystitis without hematuria: Secondary | ICD-10-CM | POA: Insufficient documentation

## 2024-10-05 LAB — COMPREHENSIVE METABOLIC PANEL WITH GFR
ALT: 22 U/L (ref 0–44)
AST: 20 U/L (ref 15–41)
Albumin: 4.4 g/dL (ref 3.5–5.0)
Alkaline Phosphatase: 91 U/L (ref 38–126)
Anion gap: 13 (ref 5–15)
BUN: 17 mg/dL (ref 6–20)
CO2: 26 mmol/L (ref 22–32)
Calcium: 9.4 mg/dL (ref 8.9–10.3)
Chloride: 104 mmol/L (ref 98–111)
Creatinine, Ser: 0.86 mg/dL (ref 0.44–1.00)
GFR, Estimated: >60 mL/min (ref >60)
Glucose, Bld: 116 mg/dL — ABNORMAL HIGH (ref 70–99)
Potassium: 3.7 mmol/L (ref 3.5–5.1)
Sodium: 143 mmol/L (ref 135–145)
Total Bilirubin: <0.2 mg/dL (ref 0.0–1.2)
Total Protein: 7.2 g/dL (ref 6.5–8.1)

## 2024-10-05 LAB — URINALYSIS, ROUTINE W REFLEX MICROSCOPIC
Bilirubin Urine: NEGATIVE
Glucose, UA: NEGATIVE mg/dL
Ketones, ur: NEGATIVE mg/dL
Nitrite: NEGATIVE
Protein, ur: NEGATIVE mg/dL
Specific Gravity, Urine: 1.026 (ref 1.005–1.030)
pH: 5 (ref 5.0–8.0)

## 2024-10-05 LAB — CBC
HCT: 36.1 % (ref 36.0–46.0)
Hemoglobin: 11.8 g/dL — ABNORMAL LOW (ref 12.0–15.0)
MCH: 28.9 pg (ref 26.0–34.0)
MCHC: 32.7 g/dL (ref 30.0–36.0)
MCV: 88.5 fL (ref 80.0–100.0)
Platelets: 358 K/uL (ref 150–400)
RBC: 4.08 MIL/uL (ref 3.87–5.11)
RDW: 12.7 % (ref 11.5–15.5)
WBC: 8.8 K/uL (ref 4.0–10.5)
nRBC: 0 % (ref 0.0–0.2)

## 2024-10-05 LAB — LIPASE, BLOOD: Lipase: 23 U/L (ref 11–51)

## 2024-10-05 MED ORDER — CEPHALEXIN 250 MG PO CAPS: 500.0000 mg | ORAL_CAPSULE | Freq: Two times a day (BID) | ORAL | 0 refills | Status: AC

## 2024-10-05 MED ORDER — CEPHALEXIN 500 MG PO CAPS
500.0000 mg | ORAL_CAPSULE | Freq: Once | ORAL | Status: AC
  Administered 2024-10-05: 500 mg via ORAL
  Filled 2024-10-05: qty 1

## 2024-10-05 MED ORDER — IBUPROFEN 400 MG PO TABS
400.0000 mg | ORAL_TABLET | Freq: Once | ORAL | Status: AC
  Administered 2024-10-05: 400 mg via ORAL
  Filled 2024-10-05: qty 1

## 2024-10-05 NOTE — ED Triage Notes (Signed)
 Pt presents with lower abdominal pain for about a week. Pt reports burning sensation with urination.

## 2024-10-05 NOTE — ED Provider Notes (Signed)
 [facility] Provider Note    Event Date/Time   First MD Initiated Contact with Patient 10/05/24 2027     (approximate)   History   Abdominal Pain   HPI  Aeryn Medici is a 43 y.o. female presenting to the emergency department complaining of lower abdominal pain which has been ongoing for the last few days.  She reports dysuria.  She states that she thinks she has a urinary infection.     Physical Exam   Triage Vital Signs: ED Triage Vitals  Encounter Vitals Group     BP 10/05/24 1757 (!) 158/88     Girls Systolic BP Percentile --      Girls Diastolic BP Percentile --      Boys Systolic BP Percentile --      Boys Diastolic BP Percentile --      Pulse Rate 10/05/24 1757 (!) 108     Resp 10/05/24 1757 17     Temp 10/05/24 1757 98.3 F (36.8 C)     Temp Source 10/05/24 1757 Oral     SpO2 10/05/24 1757 97 %     Weight --      Height --      Head Circumference --      Peak Flow --      Pain Score 10/05/24 1804 8     Pain Loc --      Pain Education --      Exclude from Growth Chart --     Most recent vital signs: Vitals:   10/05/24 1757 10/05/24 1803  BP: (!) 158/88 (!) 158/88  Pulse: (!) 108 (!) 105  Resp: 17 16  Temp: 98.3 F (36.8 C) 98.3 F (36.8 C)  SpO2: 97% 99%     General: Awake, no distress.  CV:  Good peripheral perfusion.  Resp:  Normal effort.  Abd:  No distention.  Nontender to palpation   ED Results / Procedures / Treatments   Labs (all labs ordered are listed, but only abnormal results are displayed) Labs Reviewed  COMPREHENSIVE METABOLIC PANEL WITH GFR - Abnormal; Notable for the following components:      Result Value   Glucose, Bld 116 (*)    All other components within normal limits  CBC - Abnormal; Notable for the following components:   Hemoglobin 11.8 (*)    All other components within normal limits  URINALYSIS, ROUTINE W REFLEX MICROSCOPIC - Abnormal; Notable for the following components:    Color, Urine YELLOW (*)    APPearance CLOUDY (*)    Hgb urine dipstick MODERATE (*)    Leukocytes,Ua SMALL (*)    Bacteria, UA RARE (*)    All other components within normal limits  LIPASE, BLOOD  POC URINE PREG, ED     EKG     RADIOLOGY     PROCEDURES:  Critical Care performed: No  Procedures   MEDICATIONS ORDERED IN ED: Medications - No data to display   IMPRESSION / MDM / ASSESSMENT AND PLAN / ED COURSE  I reviewed the triage vital signs and the nursing notes.                              Differential diagnosis includes, but is not limited to, urinary tract infection, STI, gastroenteritis, bacterial vaginosis, bowel obstruction  Patient's presentation is most consistent with acute, uncomplicated illness.  This patient is a 43 year old female presenting to the  emergency department complaining of lower abdominal pain which has been ongoing for the last few days.  On workup here in the emergency department her urinalysis shows findings concerning for a urinary tract infection.  Her workup is otherwise unremarkable.  I discussed results with the patient.  At this time she reports that she is feeling improved and would like to go home.  Discussed treatment with Motrin  here in the emergency department as well as her first dose of antibiotic.  She will be given a prescription for the rest of her course.  She is to follow-up with her primary care physician within the next few days.  Return to the emergency department for any new or worsening symptoms.      FINAL CLINICAL IMPRESSION(S) / ED DIAGNOSES   Final diagnoses:  Acute cystitis without hematuria     Rx / DC Orders   ED Discharge Orders     None        Note:  This document was prepared using Dragon voice recognition software and may include unintentional dictation errors.   [place] Reche HERO, MD 10/05/24 2141

## 2024-10-23 ENCOUNTER — Ambulatory Visit: Payer: MEDICAID | Admitting: Family Medicine

## 2024-10-23 ENCOUNTER — Ambulatory Visit: Payer: Self-pay

## 2024-10-23 ENCOUNTER — Encounter: Payer: Self-pay | Admitting: Family Medicine

## 2024-10-23 VITALS — BP 134/84 | HR 90 | Resp 16 | Ht 62.99 in | Wt 216.0 lb

## 2024-10-23 DIAGNOSIS — N898 Other specified noninflammatory disorders of vagina: Secondary | ICD-10-CM | POA: Diagnosis not present

## 2024-10-23 DIAGNOSIS — R051 Acute cough: Secondary | ICD-10-CM | POA: Diagnosis not present

## 2024-10-23 LAB — POCT URINALYSIS DIPSTICK
Bilirubin, UA: NEGATIVE
Blood, UA: NEGATIVE
Glucose, UA: NEGATIVE
Ketones, UA: NEGATIVE
Leukocytes, UA: NEGATIVE
Nitrite, UA: NEGATIVE
Protein, UA: NEGATIVE
Spec Grav, UA: 1.01
Urobilinogen, UA: 0.2 U/dL
pH, UA: 6

## 2024-10-23 LAB — POC COVID19/FLU A&B COMBO
Covid Antigen, POC: NEGATIVE
Influenza A Antigen, POC: NEGATIVE
Influenza B Antigen, POC: NEGATIVE

## 2024-10-23 MED ORDER — OSELTAMIVIR PHOSPHATE 75 MG PO CAPS: 75.0000 mg | ORAL_CAPSULE | Freq: Two times a day (BID) | ORAL | 0 refills | Status: AC
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
## 2024-10-23 NOTE — Patient Instructions (Signed)
 You have a viral infection that will resolve on its own over time.  Symptomatic treatment is ideal at this time. Symptoms will last 3-7 days but can stretch out to 10-14 days. Call if you are not improving by 7-10 days as this may have progressed to a bacterial infection.  You can use nasal saline to help with nasal drainage/congestion. Humidification may also be beneficial.  Unfortunately, antibiotics are not helpful for viral infections.  COVID-19 positive:  Influenza positive:  Stay home and away from others until symptoms are improving and fever free for 24 hours without the use of tylenol or ibuprofen. If symptoms have improved and fever resolved, then can be in public setting, but should wear a mask around others, practice safe distancing, and perform hand hygiene for an additional 5 days. If symptoms worsen, or the patient develop shortness of breath, pulse oximeter reading of < 90%, or chest pain, seek immediate care at nearest emergency department or call 911.   Vitamin Regimen:  Vitamin C 500mg  twice daily  Vitamin D 5000 units once daily  Zinc 50-75mg  once daily   Over the counter Medications (*unless allergic or contraindicated*):  Use Tylenol (acetaminophen) for fever/pain Use Advil (ibuprofen) for fever/pain/inflammation   Non-Medication Therapy:  Drink plenty of fluids and stay hydrated.  A teaspoon of honey may help ease coughing symptoms.  Cough drops or hard candy for coughing.   Over the Counter Medication Therapy:  Use a cough expectorant such as guaifenesin (Mucinex) if recommended by your doctor for a wet, congested cough. If you have high blood pressure, please ask your doctor first before using this.  Use a cough suppressant such as dextromethorphan (Robitussin/Delsym) for a dry cough. If you have high blood pressure, please ask your doctor first before using this.  If you have high blood pressure, medication such as Coricidin HBP is safe to take for your cough and  will not increase your blood pressure.

## 2024-10-23 NOTE — Telephone Encounter (Signed)
 FYI Only or Action Required?: FYI only for provider: appointment scheduled on 10/23/24.  Patient was last seen in primary care on 08/27/2024 by Melvin Pao, NP.  Called Nurse Triage reporting URI.  Symptoms began several days ago.  Interventions attempted: OTC medications: Vicks cold medicine, Ibuprofen .  Symptoms are: gradually worsening.  Triage Disposition: See Physician Within 24 Hours  Patient/caregiver understands and will follow disposition?:   Reason for Disposition  [1] SEVERE sore throat AND [2] present > 24 hours  Answer Assessment - Initial Assessment Questions Pt calling to report cold symptoms that started 10/21/24 - sore throat, congestion in the lungs and sinuses, difficulty breathing d/t congestion, chills, productive cough - yellow/green.  Vicks cold medicine, ibuprofen   1. ONSET: When did the nasal discharge start?      10/21/24 2. AMOUNT: How much discharge is there?      A lot coming up 3. COUGH: Do you have a cough? If Yes, ask: Describe the color of your mucus. (e.g., clear, white, yellow, green)     Yellow/green 4. RESPIRATORY DISTRESS: Describe your breathing.      Shallow and burning in the chest d/t coughing  5. FEVER: Do you have a fever? If Yes, ask: What is your temperature, how was it measured, and when did it start?     Denies  6. SEVERITY: Overall, how bad are you feeling right now? (e.g., doesn't interfere with normal activities, staying home from school/work, staying in bed)      Not feeling well 7. OTHER SYMPTOMS: Do you have any other symptoms? (e.g., earache, mouth sores, sore throat, wheezing)     denies  Protocols used: Common Cold-A-AH  Copied from CRM #[id number]. Topic: Clinical - Red Word Triage >> Oct 23, 2024  8:12 AM Larissa RAMAN wrote: [facility] that prompted transfer to Nurse Triage: difficulty breathing and chest congestion

## 2024-10-24 MED ORDER — MONISTAT 1 DAY OR NIGHT 1200 & 2 MG & % VA KIT: 1.0000 | PACK | Freq: Once | VAGINAL | 0 refills | Status: AC

## 2024-11-04 ENCOUNTER — Other Ambulatory Visit: Payer: Self-pay | Admitting: Nurse Practitioner

## 2024-11-04 NOTE — Telephone Encounter (Signed)
 Copied from CRM [id number]. Topic: Clinical - Medication Refill >> Nov 04, 2024 11:14 AM Rosaria E wrote: Medication:  promethazine  (PHENERGAN ) 12.5 MG tablet hydrOXYzine  (VISTARIL ) 50 MG capsule cyclobenzaprine  (FLEXERIL ) 10 MG tablet  Has the patient contacted their pharmacy? Yes (Agent: If no, request that the patient contact the pharmacy for the refill. If patient does not wish to contact the pharmacy document the reason why and proceed with request.) (Agent: If yes, when and what did the pharmacy advise?)  This is the patient's preferred pharmacy:  [id number] - [place], [place] - [address] [place] [address] Phone: [phone] Fax: [phone]  Is this the correct pharmacy for this prescription? Yes If no, delete pharmacy and type the correct one.   Has the prescription been filled recently? Yes  Is the patient out of the medication? Yes  Has the patient been seen for an appointment in the last year OR does the patient have an upcoming appointment? Yes  Can we respond through MyChart? Yes  Agent: Please be advised that Rx refills may take up to 3 business days. We ask that you follow-up with your pharmacy.

## 2024-11-05 ENCOUNTER — Emergency Department: Payer: MEDICAID

## 2024-11-05 ENCOUNTER — Emergency Department
Admission: EM | Admit: 2024-11-05 | Discharge: 2024-11-05 | Disposition: A | Discharge status: Home / Self Care | Payer: MEDICAID | Attending: Emergency Medicine | Admitting: Emergency Medicine

## 2024-11-05 DIAGNOSIS — R0781 Pleurodynia: Secondary | ICD-10-CM | POA: Insufficient documentation

## 2024-11-05 DIAGNOSIS — F17200 Nicotine dependence, unspecified, uncomplicated: Secondary | ICD-10-CM | POA: Diagnosis not present

## 2024-11-05 DIAGNOSIS — R079 Chest pain, unspecified: Secondary | ICD-10-CM

## 2024-11-05 LAB — CBC
HCT: 37 % (ref 36.0–46.0)
Hemoglobin: 12.2 g/dL (ref 12.0–15.0)
MCH: 29.3 pg (ref 26.0–34.0)
MCHC: 33 g/dL (ref 30.0–36.0)
MCV: 88.7 fL (ref 80.0–100.0)
Platelets: 368 K/uL (ref 150–400)
RBC: 4.17 MIL/uL (ref 3.87–5.11)
RDW: 13 % (ref 11.5–15.5)
WBC: 7.6 K/uL (ref 4.0–10.5)
nRBC: 0 % (ref 0.0–0.2)

## 2024-11-05 LAB — BASIC METABOLIC PANEL WITH GFR
Anion gap: 11 (ref 5–15)
BUN: 9 mg/dL (ref 6–20)
CO2: 26 mmol/L (ref 22–32)
Calcium: 9.5 mg/dL (ref 8.9–10.3)
Chloride: 106 mmol/L (ref 98–111)
Creatinine, Ser: 0.82 mg/dL (ref 0.44–1.00)
GFR, Estimated: >60 mL/min
Glucose, Bld: 126 mg/dL — ABNORMAL HIGH (ref 70–99)
Potassium: 3.9 mmol/L (ref 3.5–5.1)
Sodium: 143 mmol/L (ref 135–145)

## 2024-11-05 LAB — TROPONIN T, HIGH SENSITIVITY: Troponin T High Sensitivity: <15 ng/L (ref 0–19)

## 2024-11-05 MED ORDER — PROMETHAZINE HCL 12.5 MG PO TABS: 12.5000 mg | ORAL_TABLET | Freq: Three times a day (TID) | ORAL | 0 refills | Status: AC | PRN

## 2024-11-05 MED ORDER — IBUPROFEN 600 MG PO TABS
600.0000 mg | ORAL_TABLET | Freq: Once | ORAL | Status: AC
  Administered 2024-11-05: 600 mg via ORAL
  Filled 2024-11-05: qty 1

## 2024-11-05 MED ORDER — OXYCODONE-ACETAMINOPHEN 5-325 MG PO TABS
1.0000 | ORAL_TABLET | Freq: Once | ORAL | Status: AC
  Administered 2024-11-05: 1 via ORAL
  Filled 2024-11-05: qty 1

## 2024-11-05 MED ORDER — IBUPROFEN 600 MG PO TABS: 600.0000 mg | ORAL_TABLET | Freq: Three times a day (TID) | ORAL | 0 refills | Status: AC | PRN

## 2024-11-05 MED ORDER — CYCLOBENZAPRINE HCL 10 MG PO TABS: 10.0000 mg | ORAL_TABLET | Freq: Three times a day (TID) | ORAL | 1 refills | Status: AC | PRN

## 2024-11-05 MED ORDER — HYDROXYZINE PAMOATE 50 MG PO CAPS: 50.0000 mg | ORAL_CAPSULE | Freq: Three times a day (TID) | ORAL | 2 refills | Status: AC | PRN

## 2024-11-05 MED ORDER — OXYCODONE-ACETAMINOPHEN 5-325 MG PO TABS: 1.0000 | ORAL_TABLET | ORAL | 0 refills | Status: AC | PRN

## 2024-11-05 NOTE — ED Notes (Signed)
 Dr. Jossie at bedside discussing test results and updating patient on plan of care.

## 2024-11-05 NOTE — Telephone Encounter (Signed)
 Requested Prescriptions  Pending Prescriptions Disp Refills   promethazine  (PHENERGAN ) 12.5 MG tablet 20 tablet 0    Sig: Take 1 tablet (12.5 mg total) by mouth every 8 (eight) hours as needed for nausea or vomiting.     Not Delegated - Gastroenterology: Antiemetics Failed - 11/05/2024  1:04 PM      Failed - This refill cannot be delegated      Passed - Valid encounter within last 6 months    Recent Outpatient Visits           2 months ago Jaw pain   [place] [facility] Melvin Pao, NP   3 months ago Facial swelling   [place] [facility] [place], [place] T, NP   3 months ago Palpitations   [place] [facility] [place], Pao, NP   5 months ago Bipolar affective disorder, current episode mixed, current episode severity unspecified [facility])   [place] [facility] Melvin Pao, NP   5 months ago Anxiety   [place] [facility], [place] P, DO               hydrOXYzine  (VISTARIL ) 50 MG capsule 30 capsule 2    Sig: Take 1 capsule (50 mg total) by mouth 3 (three) times daily as needed.     Ear, Nose, and Throat:  Antihistamines 2 Passed - 11/05/2024  1:04 PM      Passed - Cr in normal range and within 360 days    Creatinine  Date Value Ref Range Status  07/03/2014 1.02 0.60 - 1.30 mg/dL Final   Creatinine, Ser  Date Value Ref Range Status  11/05/2024 0.82 0.44 - 1.00 mg/dL Final         Passed - Valid encounter within last 12 months    Recent Outpatient Visits           2 months ago Jaw pain   [place] [facility] Melvin Pao, NP   3 months ago Facial swelling   [place] [facility] [place], [place] T, NP   3 months ago Palpitations   [employer] [facility] [place], Pao, NP   5 months ago Bipolar affective disorder, current episode mixed, current episode severity unspecified [facility])   [place] [facility]  Melvin Pao, NP   5 months ago Anxiety   [place] [facility], [place] P, DO               cyclobenzaprine  (FLEXERIL ) 10 MG tablet 30 tablet 1    Sig: Take 1 tablet (10 mg total) by mouth 3 (three) times daily as needed for muscle spasms.     Not Delegated - Analgesics:  Muscle Relaxants Failed - 11/05/2024  1:04 PM      Failed - This refill cannot be delegated      Passed - Valid encounter within last 6 months    Recent Outpatient Visits           2 months ago Jaw pain   [place] [facility] Melvin Pao, NP   3 months ago Facial swelling   [place] [facility] [place], [place] T, NP   3 months ago Palpitations   [place] [facility] [place], Pao, NP   5 months ago Bipolar affective disorder, current episode mixed, current episode severity unspecified [facility])    [facility] Melvin Pao, NP   5 months  ago Anxiety   [place] [facility] [place], [place], [place]

## 2024-11-05 NOTE — ED Provider Notes (Signed)
 "  [facility] Provider Note   Event Date/Time   First MD Initiated Contact with Patient 11/05/24 1212     (approximate) History  Chest Pain  HPI Quorra Rosene is a 44 y.o. female with a past medical history of bipolar disorder, recurrent pancreatitis, and tobacco abuse who presents complaining of of left-sided chest pain over the last 6 days after getting over a cold last week.  Patient states that she feels somewhat heaviness in her chest but does endorse pain with palpation over the left aspect of the chest musculature.  Patient also endorses pleuritic pain and pain with laying flat that is somewhat relieved by sitting forward.  Patient denies any dyspnea on exertion but does endorse mild worsening pain.  Patient denies any persistent cough, fever, or nausea/vomiting/diarrhea ROS: Patient currently denies any vision changes, tinnitus, difficulty speaking, facial droop, sore throat, abdominal pain, nausea/vomiting/diarrhea, dysuria, or weakness/numbness/paresthesias in any extremity   Physical Exam  Triage Vital Signs: ED Triage Vitals  Encounter Vitals Group     BP 11/05/24 1055 (!) 143/100     Girls Systolic BP Percentile --      Girls Diastolic BP Percentile --      Boys Systolic BP Percentile --      Boys Diastolic BP Percentile --      Pulse Rate 11/05/24 1055 74     Resp 11/05/24 1055 18     Temp 11/05/24 1055 98.2 F (36.8 C)     Temp Source 11/05/24 1055 Oral     SpO2 11/05/24 1055 97 %     Weight 11/05/24 1054 215 lb (97.5 kg)     Height 11/05/24 1054 5' 3 (1.6 m)     Head Circumference --      Peak Flow --      Pain Score 11/05/24 1053 9     Pain Loc --      Pain Education --      Exclude from Growth Chart --    Most recent vital signs: Vitals:   11/05/24 1055  BP: (!) 143/100  Pulse: 74  Resp: 18  Temp: 98.2 F (36.8 C)  SpO2: 97%   General: Awake, oriented x4. CV:  Good peripheral perfusion.  Bedside echocardiogram shows  small fluid collection in the dependent portion of the pericardium Resp:  Normal effort. Abd:  No distention. Other:  Middle-aged obese Caucasian female resting comfortably in no acute distress.  Tenderness to palpation over left parasternal musculature of the chest ED Results / Procedures / Treatments  Labs (all labs ordered are listed, but only abnormal results are displayed) Labs Reviewed  BASIC METABOLIC PANEL WITH GFR - Abnormal; Notable for the following components:      Result Value   Glucose, Bld 126 (*)    All other components within normal limits  CBC  POC URINE PREG, ED  TROPONIN T, HIGH SENSITIVITY   EKG ED ECG REPORT I, Artist MARLA Kerns, the attending physician, personally viewed and interpreted this ECG. Date: 11/05/2024 EKG Time: 1052 Rate: 78 Rhythm: normal sinus rhythm QRS Axis: normal Intervals: normal ST/T Wave abnormalities: normal Narrative Interpretation: no evidence of acute ischemia RADIOLOGY ED MD interpretation: 2 view chest x-ray interpreted by me shows no evidence of acute abnormalities including no pneumonia, pneumothorax, or widened mediastinum - All radiology independently interpreted and agree with radiology assessment Official radiology report(s): DG Chest 2 View Result Date: 11/05/2024 CLINICAL DATA:  Chest pain EXAM: CHEST - 2 VIEW  COMPARISON:  11/05/2022 FINDINGS: External artifact overlies the chest. The heart size and mediastinal contours are within normal limits. Both lungs are clear. The visualized skeletal structures are unremarkable. IMPRESSION: No active cardiopulmonary disease. Electronically Signed   By: CHRISTELLA.  Shick M.D.   On: 11/05/2024 11:13   PROCEDURES: Critical Care performed: No Procedures MEDICATIONS ORDERED IN ED: Medications  ibuprofen  (ADVIL ) tablet 600 mg (has no administration in time range)  oxyCODONE -acetaminophen  (PERCOCET/ROXICET) 5-325 MG per tablet 1 tablet (has no administration in time range)   IMPRESSION / MDM /  ASSESSMENT AND PLAN / ED COURSE  I reviewed the triage vital signs and the nursing notes.                             The patient is on the cardiac monitor to evaluate for evidence of arrhythmia and/or significant heart rate changes. Patient's presentation is most consistent with acute presentation with potential threat to life or bodily function. Patient is a 44 year old female with the above-stated past medical history who presents complaining of left-sided chest pain after upper respiratory infection 6 days prior to arrival. DDx: PE, ACS, pericarditis, pleurisy, pneumonia, pneumothorax, COPD exacerbation Plan: CBC, BMP, troponin, chest x-ray, EKG, bedside echo  Laboratory radiologic valuation does not show any evidence of acute abnormalities.  Patient's bedside echo does show a small layering dependent fluid collection along the pericardium suggestive of possible pericarditis.  Given the patient's symptoms fit clinically as well (worse laying down than sitting forward).  Will plan on a course of NSAIDs as well as follow-up with her primary care physician.  Given the patient's pain is significant, we will provide a short course of narcotic pain control as well.  Patient expressed understanding, was given strict return precautions, and all questions were answered prior to discharge.  Dispo: Discharge home with PCP follow-up   FINAL CLINICAL IMPRESSION(S) / ED DIAGNOSES   Final diagnoses:  Chest pain, unspecified type  Anterior pleuritic pain   Rx / DC Orders   ED Discharge Orders          Ordered    ibuprofen  (ADVIL ) 600 MG tablet  Every 8 hours PRN        11/05/24 1257    oxyCODONE -acetaminophen  (PERCOCET) 5-325 MG tablet  Every 4 hours PRN        11/05/24 1257           Note:  This document was prepared using Dragon voice recognition software and may include unintentional dictation errors.   Jene Huq K, MD 11/05/24 1301  "

## 2024-11-05 NOTE — ED Triage Notes (Signed)
 Pt presents to the ED via POV from home with central to left sided chest pain x6 days. Reports having a bad cold last week. Pt states that she feels like the congestion has settled in her chest. Pt reports that the pain is there all the time.

## 2024-11-05 NOTE — Telephone Encounter (Signed)
 Requested medication (s) are due for refill today - yes  Requested medication (s) are on the active medication list -yes  Future visit scheduled -yes  Last refill: promethazine - 07/24/24 #20                  Cyclobenzaprine - 08/27/24 #30 1RF  Notes to clinic: non delegated Rx  Requested Prescriptions  Pending Prescriptions Disp Refills   promethazine  (PHENERGAN ) 12.5 MG tablet 20 tablet 0    Sig: Take 1 tablet (12.5 mg total) by mouth every 8 (eight) hours as needed for nausea or vomiting.     Not Delegated - Gastroenterology: Antiemetics Failed - 11/05/2024  1:05 PM      Failed - This refill cannot be delegated      Passed - Valid encounter within last 6 months    Recent Outpatient Visits           2 months ago Jaw pain   [place] Melvin Pao, NP   3 months ago Facial swelling   [place] [facility] [place], Gattman T, NP   3 months ago Palpitations   [place] [facility] Melvin Pao, NP   5 months ago Bipolar affective disorder, current episode mixed, current episode severity unspecified [facility])   [place] Melvin Pao, NP   5 months ago Anxiety   [place] [facility], Megan P, DO               cyclobenzaprine  (FLEXERIL ) 10 MG tablet 30 tablet 1    Sig: Take 1 tablet (10 mg total) by mouth 3 (three) times daily as needed for muscle spasms.     Not Delegated - Analgesics:  Muscle Relaxants Failed - 11/05/2024  1:05 PM      Failed - This refill cannot be delegated      Passed - Valid encounter within last 6 months    Recent Outpatient Visits           2 months ago Jaw pain   [place] [facility] Melvin Pao, NP   3 months ago Facial swelling   [place] [facility] [place], [place] T, NP   3 months ago Palpitations   [place] [facility] [place], Pao, NP   5 months ago Bipolar  affective disorder, current episode mixed, current episode severity unspecified [facility])   [place] [facility] Melvin Pao, NP   5 months ago Anxiety   [place] [facility] [place], [place], DO              Signed Prescriptions Disp Refills   hydrOXYzine  (VISTARIL ) 50 MG capsule 30 capsule 2    Sig: Take 1 capsule (50 mg total) by mouth 3 (three) times daily as needed.     Ear, Nose, and Throat:  Antihistamines 2 Passed - 11/05/2024  1:05 PM      Passed - Cr in normal range and within 360 days    Creatinine  Date Value Ref Range Status  07/03/2014 1.02 0.60 - 1.30 mg/dL Final   Creatinine, Ser  Date Value Ref Range Status  11/05/2024 0.82 0.44 - 1.00 mg/dL Final         Passed - Valid encounter within last 12 months    Recent Outpatient Visits           2 months ago Jaw pain    [facility] [place],  Darice, NP   3 months ago Facial swelling   [place] [facility] [place], Alva T, NP   3 months ago Palpitations   [place] [facility] [place], Darice, NP   5 months ago Bipolar affective disorder, current episode mixed, current episode severity unspecified (HCC)   [place] [facility] Melvin Darice, NP   5 months ago Anxiety   [place] [facility] [place], [place] P, DO                 Requested Prescriptions  Pending Prescriptions Disp Refills   promethazine  (PHENERGAN ) 12.5 MG tablet 20 tablet 0    Sig: Take 1 tablet (12.5 mg total) by mouth every 8 (eight) hours as needed for nausea or vomiting.     Not Delegated - Gastroenterology: Antiemetics Failed - 11/05/2024  1:05 PM      Failed - This refill cannot be delegated      Passed - Valid encounter within last 6 months    Recent Outpatient Visits           2 months ago Jaw pain   [place] [facility] Melvin Darice, NP   3 months ago Facial swelling   [facility] [place], [place] C [place] T, NP   3 months ago Palpitations   [place] [facility] Melvin Darice, NP   5 months ago Bipolar affective disorder, current episode mixed, current episode severity unspecified [facility])   [place] [facility] Melvin Darice, NP   5 months ago Anxiety   [facility], Megan P, DO               cyclobenzaprine  (FLEXERIL ) 10 MG tablet 30 tablet 1    Sig: Take 1 tablet (10 mg total) by mouth 3 (three) times daily as needed for muscle spasms.     Not Delegated - Analgesics:  Muscle Relaxants Failed - 11/05/2024  1:05 PM      Failed - This refill cannot be delegated      Passed - Valid encounter within last 6 months    Recent Outpatient Visits           2 months ago Jaw pain   [place] [facility] Melvin Darice, NP   3 months ago Facial swelling   [place] [facility] [place], [place] T, NP   3 months ago Palpitations   [facility], Darice, NP   5 months ago Bipolar affective disorder, current episode mixed, current episode severity unspecified [facility])   [place] [facility] Melvin Darice, NP   5 months ago Anxiety    [facility] [employer], [place], DO              Signed Prescriptions Disp Refills   hydrOXYzine  (VISTARIL ) 50 MG capsule 30 capsule 2    Sig: Take 1 capsule (50 mg total) by mouth 3 (three) times daily as needed.     Ear, Nose, and Throat:  Antihistamines 2 Passed - 11/05/2024  1:05 PM      Passed - Cr in normal range and within 360 days    Creatinine  Date Value Ref Range Status  07/03/2014 1.02 0.60 - 1.30 mg/dL Final   Creatinine, Ser  Date Value Ref Range Status  11/05/2024 0.82 0.44 - 1.00 mg/dL Final         Passed - Valid  encounter within last 12 months    Recent Outpatient Visits           2 months ago Jaw pain   [facility]vin Pao, NP   3 months ago Facial swelling   [place] [facility] [place], Hillman T, NP   3 months ago Palpitations   [place] [facility] [place], Pao, NP   5 months ago Bipolar affective disorder, current episode mixed, current episode severity unspecified [facility])   [facility] Melvin Pao, NP   5 months ago Anxiety   [place] [facility] [place], [place], [place]

## 2024-11-07 ENCOUNTER — Ambulatory Visit: Payer: MEDICAID | Admitting: Nurse Practitioner

## 2024-11-14 ENCOUNTER — Emergency Department
Admission: EM | Admit: 2024-11-14 | Discharge: 2024-11-14 | Disposition: A | Discharge status: Home / Self Care | Payer: MEDICAID | Attending: Emergency Medicine | Admitting: Emergency Medicine

## 2024-11-14 ENCOUNTER — Other Ambulatory Visit: Payer: Self-pay

## 2024-11-14 DIAGNOSIS — N898 Other specified noninflammatory disorders of vagina: Secondary | ICD-10-CM | POA: Diagnosis present

## 2024-11-14 DIAGNOSIS — R300 Dysuria: Secondary | ICD-10-CM | POA: Insufficient documentation

## 2024-11-14 LAB — URINALYSIS, ROUTINE W REFLEX MICROSCOPIC
Bilirubin Urine: NEGATIVE
Glucose, UA: NEGATIVE mg/dL
Ketones, ur: NEGATIVE mg/dL
Nitrite: NEGATIVE
Protein, ur: NEGATIVE mg/dL
Specific Gravity, Urine: 1.026 (ref 1.005–1.030)
pH: 5 (ref 5.0–8.0)

## 2024-11-14 LAB — WET PREP, GENITAL
Clue Cells Wet Prep HPF POC: NONE SEEN
Sperm: NONE SEEN
Trich, Wet Prep: NONE SEEN
WBC, Wet Prep HPF POC: <10
Yeast Wet Prep HPF POC: NONE SEEN

## 2024-11-14 LAB — PREGNANCY, URINE: Preg Test, Ur: NEGATIVE

## 2024-11-14 MED ORDER — CEPHALEXIN 500 MG PO CAPS: 500.0000 mg | ORAL_CAPSULE | Freq: Two times a day (BID) | ORAL | 0 refills | Status: AC

## 2024-11-14 MED ORDER — CEPHALEXIN 500 MG PO CAPS
500.0000 mg | ORAL_CAPSULE | Freq: Once | ORAL | Status: AC
  Administered 2024-11-14: 500 mg via ORAL
  Filled 2024-11-14: qty 1

## 2024-11-14 NOTE — ED Triage Notes (Signed)
 Pt says she thinks she has a vaginal infection from wearing her clothes too tight. Pt also c/o lower back pain and feeling like she can't fully empty her bladder.

## 2024-11-14 NOTE — ED Provider Notes (Signed)
 "   [facility] Emergency Department Provider Note     Event Date/Time   First MD Initiated Contact with Patient 11/14/24 1533     (approximate)   History   Vaginal Itching   HPI  Briana Lyons is a 44 y.o. female presents to the ED with complaint of vaginal itching, increased vaginal discharge and burning with urination x 4 days.  Patient reports she recently took Diflucan  and yeast infection cream, but symptoms are persistent.  She is contributing her symptoms from wearing tight clothing.  She denies any vaginal bleeding or hematuria. No abdominal pain. She does have a history of kidney stones, however today symptoms are different.  Endorses urinary retention.  Also notes chronic left sided back pain that she reports is due to a known ovarian cyst. Patient reports she does not have any concerns for STD/STI at this time.     Physical Exam   Triage Vital Signs: ED Triage Vitals  Encounter Vitals Group     BP 11/14/24 1511 (!) 149/98     Girls Systolic BP Percentile --      Girls Diastolic BP Percentile --      Boys Systolic BP Percentile --      Boys Diastolic BP Percentile --      Pulse Rate 11/14/24 1511 97     Resp 11/14/24 1511 17     Temp 11/14/24 1510 98.9 F (37.2 C)     Temp Source 11/14/24 1510 Oral     SpO2 11/14/24 1511 96 %     Weight 11/14/24 1511 216 lb (98 kg)     Height 11/14/24 1511 5' 3 (1.6 m)     Head Circumference --      Peak Flow --      Pain Score 11/14/24 1510 7     Pain Loc --      Pain Education --      Exclude from Growth Chart --     Most recent vital signs: Vitals:   11/14/24 1510 11/14/24 1511  BP:  (!) 149/98  Pulse:  97  Resp:  17  Temp: 98.9 F (37.2 C)   SpO2:  96%    General Awake, no distress.  HEENT NCAT.  CV:  Good peripheral perfusion.  RESP:  Normal effort.  ABD:  No distention.  Soft, nontender.  Negative CVA tenderness bilaterally. GU:  Deferred.    ED Results / Procedures /  Treatments   Labs (all labs ordered are listed, but only abnormal results are displayed) Labs Reviewed  URINALYSIS, ROUTINE W REFLEX MICROSCOPIC - Abnormal; Notable for the following components:      Result Value   Color, Urine YELLOW (*)    APPearance CLOUDY (*)    Hgb urine dipstick SMALL (*)    Leukocytes,Ua TRACE (*)    Bacteria, UA FEW (*)    All other components within normal limits  WET PREP, GENITAL  URINE CULTURE  PREGNANCY, URINE   No results found.  PROCEDURES:  Critical Care performed: No  Procedures   MEDICATIONS ORDERED IN ED: Medications  cephALEXin  (KEFLEX ) capsule 500 mg (500 mg Oral Given 11/14/24 1706)     IMPRESSION / MDM / ASSESSMENT AND PLAN / ED COURSE  I reviewed the triage vital signs and the nursing notes.  44 y.o. female presents to the emergency department for evaluation and treatment of vaginal itching. See HPI for further details.   Differential diagnosis includes, but is not limited to UTI, pyelonephritis, yeast infection   Patient's presentation is most consistent with acute, uncomplicated illness.  Patient is alert and oriented.  She is hemodynamically stable.  Physical exam findings are stated above and benign.  Urinalysis with possible indication for UTI.  Urine culture pending.  Patient did not want STD STI testing at this visit.  Wet prep is unremarkable.  Low suspicion for nephrolithiasis given absence of CVA tenderness.  No indication for further workup with imaging at this time.  Will treat with short course of broad-spectrum antibiotic.  Advised if symptoms persist to follow-up with primary care provider in 1 week.  Patient verbalized understanding.  She is in stable and satisfactory condition for discharge home at this time.   FINAL CLINICAL IMPRESSION(S) / ED DIAGNOSES   Final diagnoses:  Vaginal itching  Dysuria    Rx / DC Orders   ED Discharge Orders          Ordered    cephALEXin   (KEFLEX ) 500 MG capsule  2 times daily        11/14/24 1659           Note:  This document was prepared using Dragon voice recognition software and may include unintentional dictation errors.    Margrette, Jacinto Keil A, PA-C 11/14/24 1727    Viviann Pastor, MD 11/14/24 2243  "

## 2024-11-14 NOTE — Discharge Instructions (Addendum)
 Your evaluated in the ED for vaginal itching.  You are urinalysis shows a trace of leukocytes which may be consistent with a urinary tract infection.  A urine culture is pending.  If bacteria grows on this culture you will receive a call in approximately 3 to 5 days.  Refrain from wearing tight clothing.  If symptoms do not improve in 1 week please follow-up with your primary care provider.  If any new or worsening symptoms return to ED for further evaluation.

## 2024-11-18 LAB — URINE CULTURE: Culture: 70000 — AB

## 2024-11-19 ENCOUNTER — Inpatient Hospital Stay: Payer: MEDICAID | Admitting: Nurse Practitioner

## 2024-11-19 NOTE — Progress Notes (Signed)
 ED Antimicrobial Stewardship Positive Culture Follow Up   Briana Lyons is an 44 y.o. female who presented to [facility] on 11/14/2024 with a chief complaint of  Chief Complaint  Patient presents with   Vaginal Itching    Recent Results (from the past 720 hours)  Urine Culture     Status: Abnormal   Collection Time: 11/14/24  3:11 PM   Specimen: Urine, Clean Catch  Result Value Ref Range Status   Specimen Description   Final    URINE, CLEAN CATCH Performed at [facility], [address] [place], [place] [address]    Special Requests   Final    NONE Performed at [facility], [address] [place], [place] [address]    Culture (A)  Final    70,000 COLONIES/mL GROUP B STREP(S.AGALACTIAE)ISOLATED 30,000 COLONIES/mL STAPHYLOCOCCUS HAEMOLYTICUS TESTING AGAINST S. AGALACTIAE NOT ROUTINELY PERFORMED DUE TO PREDICTABILITY OF AMP/PEN/VAN SUSCEPTIBILITY. Performed at [facility], [address] [place], [place] [address]    Report Status 11/18/2024 FINAL  Final   Organism ID, Bacteria STAPHYLOCOCCUS HAEMOLYTICUS (A)  Final      Susceptibility   Staphylococcus haemolyticus - MIC*    CIPROFLOXACIN <=0.5 SENSITIVE Sensitive     GENTAMICIN <=0.5 SENSITIVE Sensitive     NITROFURANTOIN  <=16 SENSITIVE Sensitive     OXACILLIN >=4 RESISTANT Resistant     TETRACYCLINE >=16 RESISTANT Resistant     VANCOMYCIN  1 SENSITIVE Sensitive     TRIMETH /SULFA  <=10 SENSITIVE Sensitive     RIFAMPIN <=0.5 SENSITIVE Sensitive     Inducible Clindamycin  POSITIVE Resistant     * 30,000 COLONIES/mL STAPHYLOCOCCUS HAEMOLYTICUS  Wet prep, genital     Status: None   Collection Time: 11/14/24  4:19 PM  Result Value Ref Range Status   Yeast Wet Prep HPF POC NONE SEEN NONE SEEN Final   Trich, Wet Prep NONE SEEN NONE SEEN Final   Clue Cells Wet Prep HPF POC NONE SEEN NONE SEEN Final   WBC, Wet Prep HPF POC <10 <10 Final   Sperm NONE SEEN  Final    Comment: Specimen diluted  due to transport tube containing more than 1 ml of saline, interpret results with caution. Performed at [facility], [address] [place], [place] [address]     [x]  Treated with cephalexin  500mg  PO x7 days, organism resistant to prescribed antimicrobial  Patient was discharged from ED on 1/22 with a 7-day course of cephalexin . Arrived with complaints of lower back pain, vaginal discharge and itchiness, feeling of urinary retention, and dysuria. Had recent December ED visit for similar symptoms and was discharged with cephalexin  - no urine cultures resulted. Discussed patient with provider, and agreed to add nitrofurantoin  100 mg BID x5 days to antibiotic regimen if patient having worsening symptoms. If patient's symptoms not worsening or improving, plan to keep current antibiotic regimen.  Attempted to reach patient this morning (1/27), was hung up on by family member. Left voicemail. Attempted to reach patient this afternoon, and left voicemail.  Will plan to contact patient once more on 1/28.  New antibiotic prescription: TBD  ED Provider: Dr Claudene Leonor JAYSON Viviana, PharmD Pharmacy Resident  11/19/2024 11:37 AM

## 2024-11-21 ENCOUNTER — Inpatient Hospital Stay: Payer: MEDICAID | Admitting: Nurse Practitioner

## 2024-11-21 NOTE — Progress Notes (Unsigned)
 "  There were no vitals taken for this visit.   Subjective:    Patient ID: Briana Lyons, female    DOB: 07-17-1981, 44 y.o.   MRN: [id number]  HPI: Briana Lyons is a 43 y.o. female  No chief complaint on file.   Relevant past medical, surgical, family and social history reviewed and updated as indicated. Interim medical history since our last visit reviewed. Allergies and medications reviewed and updated.  Review of Systems  Per HPI unless specifically indicated above     Objective:    There were no vitals taken for this visit.  Wt Readings from Last 3 Encounters:  11/14/24 216 lb (98 kg)  11/05/24 215 lb (97.5 kg)  10/23/24 216 lb (98 kg)    Physical Exam  Results for orders placed or performed during the hospital encounter of 11/14/24  Urine Culture   Collection Time: 11/14/24  3:11 PM   Specimen: Urine, Clean Catch  Result Value Ref Range   Specimen Description      URINE, CLEAN CATCH Performed at [facility], [address] [place], [place] [address]    Special Requests      NONE Performed at [facility], [address], [place], [place] [address]    Culture (A)     70,000 COLONIES/mL GROUP B STREP(S.AGALACTIAE)ISOLATED 30,000 COLONIES/mL STAPHYLOCOCCUS HAEMOLYTICUS TESTING AGAINST S. AGALACTIAE NOT ROUTINELY PERFORMED DUE TO PREDICTABILITY OF AMP/PEN/VAN SUSCEPTIBILITY. Performed at [facility], [address] [place], [place] [address]    Report Status 11/18/2024 FINAL    Organism ID, Bacteria STAPHYLOCOCCUS HAEMOLYTICUS (A)       Susceptibility   Staphylococcus haemolyticus - MIC*    CIPROFLOXACIN <=0.5 SENSITIVE Sensitive     GENTAMICIN <=0.5 SENSITIVE Sensitive     NITROFURANTOIN  <=16 SENSITIVE Sensitive     OXACILLIN >=4 RESISTANT Resistant     TETRACYCLINE >=16 RESISTANT Resistant     VANCOMYCIN  1 SENSITIVE Sensitive     TRIMETH /SULFA  <=10 SENSITIVE Sensitive     RIFAMPIN <=0.5 SENSITIVE Sensitive      Inducible Clindamycin  POSITIVE Resistant     * 30,000 COLONIES/mL STAPHYLOCOCCUS HAEMOLYTICUS  Urinalysis, Routine w reflex microscopic -Urine, Clean Catch   Collection Time: 11/14/24  3:11 PM  Result Value Ref Range   Color, Urine YELLOW (A) YELLOW   APPearance CLOUDY (A) CLEAR   Specific Gravity, Urine 1.026 1.005 - 1.030   pH 5.0 5.0 - 8.0   Glucose, UA NEGATIVE NEGATIVE mg/dL   Hgb urine dipstick SMALL (A) NEGATIVE   Bilirubin Urine NEGATIVE NEGATIVE   Ketones, ur NEGATIVE NEGATIVE mg/dL   Protein, ur NEGATIVE NEGATIVE mg/dL   Nitrite NEGATIVE NEGATIVE   Leukocytes,Ua TRACE (A) NEGATIVE   RBC / HPF 21-50 0 - 5 RBC/hpf   WBC, UA 6-10 0 - 5 WBC/hpf   Bacteria, UA FEW (A) NONE SEEN   Squamous Epithelial / HPF 21-50 0 - 5 /HPF   Mucus PRESENT   Pregnancy, urine   Collection Time: 11/14/24  3:11 PM  Result Value Ref Range   Preg Test, Ur NEGATIVE NEGATIVE  Wet prep, genital   Collection Time: 11/14/24  4:19 PM  Result Value Ref Range   Yeast Wet Prep HPF POC NONE SEEN NONE SEEN   Trich, Wet Prep NONE SEEN NONE SEEN   Clue Cells Wet Prep HPF POC NONE SEEN NONE SEEN   WBC, Wet Prep HPF POC <10 <10   Sperm NONE SEEN  Assessment & Plan:   Problem List Items Addressed This Visit   None    Follow up plan: No follow-ups on file.      "

## 2024-11-25 ENCOUNTER — Encounter: Payer: Self-pay | Admitting: Emergency Medicine

## 2024-11-25 ENCOUNTER — Emergency Department: Payer: MEDICAID

## 2024-11-25 ENCOUNTER — Other Ambulatory Visit: Payer: Self-pay

## 2024-11-25 ENCOUNTER — Emergency Department
Admission: EM | Admit: 2024-11-25 | Discharge: 2024-11-25 | Disposition: A | Discharge status: Home / Self Care | Payer: MEDICAID | Attending: Emergency Medicine | Admitting: Emergency Medicine

## 2024-11-25 DIAGNOSIS — M5440 Lumbago with sciatica, unspecified side: Secondary | ICD-10-CM | POA: Insufficient documentation

## 2024-11-25 LAB — COMPREHENSIVE METABOLIC PANEL WITH GFR
ALT: 24 U/L (ref 0–44)
AST: 24 U/L (ref 15–41)
Albumin: 4.3 g/dL (ref 3.5–5.0)
Alkaline Phosphatase: 106 U/L (ref 38–126)
Anion gap: 13 (ref 5–15)
BUN: 9 mg/dL (ref 6–20)
CO2: 23 mmol/L (ref 22–32)
Calcium: 9.8 mg/dL (ref 8.9–10.3)
Chloride: 102 mmol/L (ref 98–111)
Creatinine, Ser: 0.77 mg/dL (ref 0.44–1.00)
GFR, Estimated: >60 mL/min
Glucose, Bld: 202 mg/dL — ABNORMAL HIGH (ref 70–99)
Potassium: 3.7 mmol/L (ref 3.5–5.1)
Sodium: 138 mmol/L (ref 135–145)
Total Bilirubin: 0.3 mg/dL (ref 0.0–1.2)
Total Protein: 7.4 g/dL (ref 6.5–8.1)

## 2024-11-25 LAB — URINALYSIS, W/ REFLEX TO CULTURE (INFECTION SUSPECTED)
Bacteria, UA: NONE SEEN
Bilirubin Urine: NEGATIVE
Glucose, UA: NEGATIVE mg/dL
Ketones, ur: NEGATIVE mg/dL
Leukocytes,Ua: NEGATIVE
Nitrite: POSITIVE — AB
Protein, ur: NEGATIVE mg/dL
Specific Gravity, Urine: 1.005 (ref 1.005–1.030)
pH: 5 (ref 5.0–8.0)

## 2024-11-25 LAB — CBC WITH DIFFERENTIAL/PLATELET
Abs Immature Granulocytes: 0.03 10*3/uL (ref 0.00–0.07)
Basophils Absolute: 0 10*3/uL (ref 0.0–0.1)
Basophils Relative: 0 %
Eosinophils Absolute: 0.2 10*3/uL (ref 0.0–0.5)
Eosinophils Relative: 2 %
HCT: 39 % (ref 36.0–46.0)
Hemoglobin: 13 g/dL (ref 12.0–15.0)
Immature Granulocytes: 0 %
Lymphocytes Relative: 21 %
Lymphs Abs: 1.7 10*3/uL (ref 0.7–4.0)
MCH: 29.3 pg (ref 26.0–34.0)
MCHC: 33.3 g/dL (ref 30.0–36.0)
MCV: 87.8 fL (ref 80.0–100.0)
Monocytes Absolute: 0.5 10*3/uL (ref 0.1–1.0)
Monocytes Relative: 6 %
Neutro Abs: 5.6 10*3/uL (ref 1.7–7.7)
Neutrophils Relative %: 71 %
Platelets: 372 10*3/uL (ref 150–400)
RBC: 4.44 MIL/uL (ref 3.87–5.11)
RDW: 13.4 % (ref 11.5–15.5)
WBC: 8 10*3/uL (ref 4.0–10.5)
nRBC: 0 % (ref 0.0–0.2)

## 2024-11-25 LAB — POC URINE PREG, ED: Preg Test, Ur: NEGATIVE

## 2024-11-25 MED ORDER — KETOROLAC TROMETHAMINE 30 MG/ML IJ SOLN: 30.0000 mg | Freq: Once | INTRAMUSCULAR | Status: DC

## 2024-11-25 MED ORDER — KETOROLAC TROMETHAMINE 30 MG/ML IJ SOLN
30.0000 mg | Freq: Once | INTRAMUSCULAR | Status: AC
  Administered 2024-11-25: 30 mg via INTRAMUSCULAR
  Filled 2024-11-25: qty 1

## 2024-11-25 MED ORDER — PREDNISONE 20 MG PO TABS: 60.0000 mg | ORAL_TABLET | Freq: Once | ORAL | Status: DC

## 2024-11-25 MED ORDER — DEXAMETHASONE SOD PHOSPHATE PF 10 MG/ML IJ SOLN
10.0000 mg | Freq: Once | INTRAMUSCULAR | Status: AC
  Administered 2024-11-25: 10 mg via INTRAMUSCULAR
  Filled 2024-11-25: qty 1

## 2024-11-25 NOTE — ED Provider Notes (Signed)
 "  [facility] Provider Note    Event Date/Time   First MD Initiated Contact with Patient 11/25/24 [phone]     (approximate)   History   Back pain   HPI  Briana Lyons is a 44 y.o. female with a history of bipolar disorder, GERD, chronic back pain, ovarian cyst/chronic abdominal pain who presents primarily for low back pain with a tingling sensation primarily in her right leg.  Strength is normal.  No loss of bowel or bladder continence.  No IV drug abuse.  No fevers     Physical Exam   Triage Vital Signs: ED Triage Vitals  Encounter Vitals Group     BP 11/25/24 0716 (!) 139/97     Girls Systolic BP Percentile --      Girls Diastolic BP Percentile --      Boys Systolic BP Percentile --      Boys Diastolic BP Percentile --      Pulse Rate 11/25/24 0716 91     Resp 11/25/24 0716 16     Temp 11/25/24 0716 98.6 F (37 C)     Temp Source 11/25/24 0716 Oral     SpO2 11/25/24 0716 96 %     Weight 11/25/24 0719 98 kg (215 lb 15.8 oz)     Height 11/25/24 0801 1.6 m (5' 3)     Head Circumference --      Peak Flow --      Pain Score 11/25/24 0719 9     Pain Loc --      Pain Education --      Exclude from Growth Chart --     Most recent vital signs: Vitals:   11/25/24 0716 11/25/24 0717  BP: (!) 139/97   Pulse: 91   Resp: 16   Temp: 98.6 F (37 C)   SpO2: 96% 96%     General: Awake, no distress.  CV:  Good peripheral perfusion.  Resp:  Normal effort.  Abd:  No distention.  Soft, nontender, reassuring exam, no CVA tenderness  Other:  Mild right paraspinal lumbar tenderness to palpation, no vertebral tenderness to palpation, normal strength in the lower extremities.  No saddle anesthesia.  Patient ambulating quite well able to squat and stand again.  2+ distal pulses   ED Results / Procedures / Treatments   Labs (all labs ordered are listed, but only abnormal results are displayed) Labs Reviewed  COMPREHENSIVE METABOLIC PANEL WITH  GFR - Abnormal; Notable for the following components:      Result Value   Glucose, Bld 202 (*)    All other components within normal limits  URINALYSIS, W/ REFLEX TO CULTURE (INFECTION SUSPECTED) - Abnormal; Notable for the following components:   Color, Urine AMBER (*)    APPearance CLEAR (*)    Hgb urine dipstick MODERATE (*)    Nitrite POSITIVE (*)    All other components within normal limits  CULTURE, BLOOD (SINGLE)  URINE CULTURE  CBC WITH DIFFERENTIAL/PLATELET  POC URINE PREG, ED     EKG     RADIOLOGY     PROCEDURES:  Critical Care performed:   Procedures   MEDICATIONS ORDERED IN ED: Medications  ketorolac  (TORADOL ) 30 MG/ML injection 30 mg (30 mg Intramuscular Given 11/25/24 0827)  dexamethasone  (DECADRON ) injection 10 mg (10 mg Intramuscular Given 11/25/24 0828)     IMPRESSION / MDM / ASSESSMENT AND PLAN / ED COURSE  I reviewed the triage vital signs and the nursing  notes. Patient's presentation is most consistent with exacerbation of chronic illness.  Patient presents with primary complaint of right low back pain with a tingling sensation as above suspicious for sciatica.  No red flag symptoms, no signs of cauda equina.  Normal strength in the lower extremities, good ambulation  Patient has difficulty getting to the pharmacy so we will treat with IM Decadron , IM Toradol   Lab work reviewed and is overall reassuring, chest x-ray without signs of pneumonia  Recommend NSAIDs at home, close outpatient follow-up recommended, return if any worsening        FINAL CLINICAL IMPRESSION(S) / ED DIAGNOSES   Final diagnoses:  Acute right-sided low back pain with sciatica, sciatica laterality unspecified     Rx / DC Orders   ED Discharge Orders     None        Note:  This document was prepared using Dragon voice recognition software and may include unintentional dictation errors.   Arlander Charleston, MD 11/25/24 [phone]  "

## 2024-11-26 LAB — URINE CULTURE: Culture: <10000 — AB

## 2024-11-27 ENCOUNTER — Ambulatory Visit: Payer: MEDICAID | Admitting: Nurse Practitioner

## 2024-11-27 NOTE — Progress Notes (Unsigned)
 "  There were no vitals taken for this visit.   Subjective:    Patient ID: Briana Lyons, female    DOB: [birth year], 44 y.o.   MRN: [id number]  HPI: Briana Lyons is a 44 y.o. female  No chief complaint on file.  BACK PAIN Patient states she is having left lowe back pain on and off since 04/08/24.  Imaging in the ER was unremarkable- other than ovarian cysts.  Hurts with movement.  Tried muscle relaxer's and it did help her pain.    ANXIETY Patient states the Seroquel  is working well for her.  Feels like she is able to concentrate and think better.  Her anxiety has been a lot better.  She hasn't even had to take any hydroxyzine .    Relevant past medical, surgical, family and social history reviewed and updated as indicated. Interim medical history since our last visit reviewed. Allergies and medications reviewed and updated.  Review of Systems  Musculoskeletal:  Positive for back pain.  Psychiatric/Behavioral:  The patient is nervous/anxious.     Per HPI unless specifically indicated above     Objective:    There were no vitals taken for this visit.  Wt Readings from Last 3 Encounters:  11/25/24 215 lb 13.3 oz (97.9 kg)  11/14/24 216 lb (98 kg)  11/05/24 215 lb (97.5 kg)    Physical Exam Vitals and nursing note reviewed.  Constitutional:      General: She is not in acute distress.    Appearance: Normal appearance. She is normal weight. She is not ill-appearing, toxic-appearing or diaphoretic.  HENT:     Head: Normocephalic.     Right Ear: External ear normal.     Left Ear: External ear normal.     Nose: Nose normal.     Mouth/Throat:     Mouth: Mucous membranes are moist.     Pharynx: Oropharynx is clear.  Eyes:     General:        Right eye: No discharge.        Left eye: No discharge.     Extraocular Movements: Extraocular movements intact.     Conjunctiva/sclera: Conjunctivae normal.     Pupils: Pupils are equal, round, and reactive to light.   Cardiovascular:     Rate and Rhythm: Normal rate and regular rhythm.     Heart sounds: No murmur heard. Pulmonary:     Effort: Pulmonary effort is normal. No respiratory distress.     Breath sounds: Normal breath sounds. No wheezing or rales.  Musculoskeletal:     Cervical back: Normal range of motion and neck supple.  Skin:    General: Skin is warm and dry.     Capillary Refill: Capillary refill takes less than 2 seconds.  Neurological:     General: No focal deficit present.     Mental Status: She is alert and oriented to person, place, and time. Mental status is at baseline.  Psychiatric:        Mood and Affect: Mood normal.        Behavior: Behavior normal.        Thought Content: Thought content normal.        Judgment: Judgment normal.     Results for orders placed or performed during the hospital encounter of 11/25/24  Blood culture (single)   Collection Time: 11/25/24  7:23 AM   Specimen: BLOOD  Result Value Ref Range   Specimen Description BLOOD RIGHT ANTECUBITAL  Special Requests      BOTTLES DRAWN AEROBIC AND ANAEROBIC Blood Culture adequate volume   Culture      NO GROWTH 2 DAYS Performed at [facility], [address] [place], [place] [address]    Report Status PENDING   Comprehensive metabolic panel   Collection Time: 11/25/24  7:23 AM  Result Value Ref Range   Sodium 138 135 - 145 mmol/L   Potassium 3.7 3.5 - 5.1 mmol/L   Chloride 102 98 - 111 mmol/L   CO2 23 22 - 32 mmol/L   Glucose, Bld 202 (H) 70 - 99 mg/dL   BUN 9 6 - 20 mg/dL   Creatinine, Ser 9.22 0.44 - 1.00 mg/dL   Calcium  9.8 8.9 - 10.3 mg/dL   Total Protein 7.4 6.5 - 8.1 g/dL   Albumin 4.3 3.5 - 5.0 g/dL   AST 24 15 - 41 U/L   ALT 24 0 - 44 U/L   Alkaline Phosphatase 106 38 - 126 U/L   Total Bilirubin 0.3 0.0 - 1.2 mg/dL   GFR, Estimated >39 >39 mL/min   Anion gap 13 5 - 15  CBC with Differential   Collection Time: 11/25/24  7:23 AM  Result Value Ref Range   WBC 8.0 4.0  - 10.5 K/uL   RBC 4.44 3.87 - 5.11 MIL/uL   Hemoglobin 13.0 12.0 - 15.0 g/dL   HCT 60.9 63.9 - 53.9 %   MCV 87.8 80.0 - 100.0 fL   MCH 29.3 26.0 - 34.0 pg   MCHC 33.3 30.0 - 36.0 g/dL   RDW 86.5 88.4 - 84.4 %   Platelets 372 150 - 400 K/uL   nRBC 0.0 0.0 - 0.2 %   Neutrophils Relative % 71 %   Neutro Abs 5.6 1.7 - 7.7 K/uL   Lymphocytes Relative 21 %   Lymphs Abs 1.7 0.7 - 4.0 K/uL   Monocytes Relative 6 %   Monocytes Absolute 0.5 0.1 - 1.0 K/uL   Eosinophils Relative 2 %   Eosinophils Absolute 0.2 0.0 - 0.5 K/uL   Basophils Relative 0 %   Basophils Absolute 0.0 0.0 - 0.1 K/uL   Immature Granulocytes 0 %   Abs Immature Granulocytes 0.03 0.00 - 0.07 K/uL  Urine Culture   Collection Time: 11/25/24  7:24 AM   Specimen: Urine, Clean Catch  Result Value Ref Range   Specimen Description      URINE, CLEAN CATCH Performed at [facility], [address], [place], [place] [address]    Special Requests      NONE Performed at [facility], [address] [place], [place] [address]    Culture (A)     <10,000 COLONIES/mL INSIGNIFICANT GROWTH Performed at [facility], [address] [place], [place] [address]    Report Status 11/26/2024 FINAL   Urinalysis, w/ Reflex to Culture (Infection Suspected) -Urine, Clean Catch   Collection Time: 11/25/24  7:24 AM  Result Value Ref Range   Specimen Source URINE, CLEAN CATCH    Color, Urine AMBER (A) YELLOW   APPearance CLEAR (A) CLEAR   Specific Gravity, Urine 1.005 1.005 - 1.030   pH 5.0 5.0 - 8.0   Glucose, UA NEGATIVE NEGATIVE mg/dL   Hgb urine dipstick MODERATE (A) NEGATIVE   Bilirubin Urine NEGATIVE NEGATIVE   Ketones, ur NEGATIVE NEGATIVE mg/dL   Protein, ur NEGATIVE NEGATIVE mg/dL   Nitrite POSITIVE (A) NEGATIVE   Leukocytes,Ua NEGATIVE NEGATIVE   RBC /  HPF 0-5 0 - 5 RBC/hpf   WBC, UA 0-5 0 - 5 WBC/hpf   Bacteria, UA NONE SEEN NONE SEEN   Squamous Epithelial / HPF 6-10 0 - 5 /HPF   Mucus PRESENT    POC urine preg, ED   Collection Time: 11/25/24  7:37 AM  Result Value Ref Range   Preg Test, Ur NEGATIVE NEGATIVE      Assessment & Plan:   Problem List Items Addressed This Visit   None     Follow up plan: No follow-ups on file.      "

## 2024-11-29 LAB — CULTURE, BLOOD (SINGLE)
Culture: NO GROWTH
Special Requests: ADEQUATE
